# Patient Record
Sex: Male | Born: 1965 | Race: White | Hispanic: No | Marital: Married | Smoking: Never smoker
Health system: Southern US, Community
[De-identification: ages and names within clinical notes are randomized; demographics above are authoritative.]

## PROBLEM LIST (undated history)

## (undated) DIAGNOSIS — I219 Acute myocardial infarction, unspecified: Secondary | ICD-10-CM

## (undated) DIAGNOSIS — E079 Disorder of thyroid, unspecified: Secondary | ICD-10-CM

## (undated) DIAGNOSIS — I10 Essential (primary) hypertension: Secondary | ICD-10-CM

## (undated) DIAGNOSIS — E039 Hypothyroidism, unspecified: Secondary | ICD-10-CM

## (undated) DIAGNOSIS — R7401 Elevation of levels of liver transaminase levels: Secondary | ICD-10-CM

## (undated) DIAGNOSIS — R011 Cardiac murmur, unspecified: Secondary | ICD-10-CM

## (undated) DIAGNOSIS — E785 Hyperlipidemia, unspecified: Secondary | ICD-10-CM

## (undated) DIAGNOSIS — N189 Chronic kidney disease, unspecified: Secondary | ICD-10-CM

## (undated) DIAGNOSIS — F419 Anxiety disorder, unspecified: Secondary | ICD-10-CM

## (undated) DIAGNOSIS — R740 Nonspecific elevation of levels of transaminase and lactic acid dehydrogenase [LDH]: Secondary | ICD-10-CM

## (undated) DIAGNOSIS — I2510 Atherosclerotic heart disease of native coronary artery without angina pectoris: Secondary | ICD-10-CM

## (undated) DIAGNOSIS — E230 Hypopituitarism: Secondary | ICD-10-CM

## (undated) DIAGNOSIS — G4730 Sleep apnea, unspecified: Secondary | ICD-10-CM

## (undated) DIAGNOSIS — F329 Major depressive disorder, single episode, unspecified: Secondary | ICD-10-CM

## (undated) DIAGNOSIS — F32A Depression, unspecified: Secondary | ICD-10-CM

## (undated) HISTORY — PX: OTHER SURGICAL HISTORY: SHX169

## (undated) HISTORY — DX: Nonspecific elevation of levels of transaminase and lactic acid dehydrogenase (ldh): R74.0

## (undated) HISTORY — DX: Major depressive disorder, single episode, unspecified: F32.9

## (undated) HISTORY — PX: KNEE ARTHROSCOPY: SUR90

## (undated) HISTORY — DX: Sleep apnea, unspecified: G47.30

## (undated) HISTORY — PX: SHOULDER SURGERY: SHX246

## (undated) HISTORY — DX: Essential (primary) hypertension: I10

## (undated) HISTORY — DX: Elevation of levels of liver transaminase levels: R74.01

## (undated) HISTORY — DX: Hypopituitarism: E23.0

## (undated) HISTORY — DX: Atherosclerotic heart disease of native coronary artery without angina pectoris: I25.10

## (undated) HISTORY — DX: Hyperlipidemia, unspecified: E78.5

## (undated) HISTORY — PX: LASER ABLATION: SHX1947

## (undated) HISTORY — DX: Disorder of thyroid, unspecified: E07.9

## (undated) HISTORY — DX: Depression, unspecified: F32.A

## (undated) HISTORY — DX: Cardiac murmur, unspecified: R01.1

## (undated) HISTORY — DX: Acute myocardial infarction, unspecified: I21.9

---

## 1999-10-06 ENCOUNTER — Ambulatory Visit (HOSPITAL_COMMUNITY): Admission: RE | Admit: 1999-10-06 | Discharge: 1999-10-06 | Payer: Self-pay | Admitting: Vascular Surgery

## 1999-10-06 ENCOUNTER — Encounter (INDEPENDENT_AMBULATORY_CARE_PROVIDER_SITE_OTHER): Payer: Self-pay

## 1999-10-06 ENCOUNTER — Encounter: Payer: Self-pay | Admitting: Vascular Surgery

## 2009-03-24 ENCOUNTER — Encounter: Admission: RE | Admit: 2009-03-24 | Discharge: 2009-03-24 | Payer: Self-pay | Admitting: Orthopedic Surgery

## 2015-04-27 ENCOUNTER — Other Ambulatory Visit: Payer: Self-pay | Admitting: Family Medicine

## 2015-06-02 ENCOUNTER — Other Ambulatory Visit: Payer: Self-pay | Admitting: Family Medicine

## 2015-07-08 ENCOUNTER — Telehealth: Payer: Self-pay | Admitting: Family Medicine

## 2015-07-08 MED ORDER — ALPRAZOLAM 1 MG PO TABS: 1.0000 mg | ORAL_TABLET | Freq: Two times a day (BID) | ORAL | Status: DC

## 2015-07-08 MED ORDER — LIOTHYRONINE SODIUM 25 MCG PO TABS: 75.0000 ug | ORAL_TABLET | Freq: Every day | ORAL | Status: DC

## 2015-07-08 NOTE — Telephone Encounter (Signed)
Pt called stated he needs a refill on Alprazolam, he has enough to last until lunch, wants to know if 15 tabs, enough to last until his appt Monday can be called in to the pharmacy. [facility] [place]. 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 Needs ASAP. Pt also needs refill on Cytomel.

## 2015-07-08 NOTE — Telephone Encounter (Signed)
Ro from [facility] called has questions about pt's Zanax RX. Please call Ro from [facility] ASAP. Thanks.

## 2015-07-09 NOTE — Telephone Encounter (Signed)
Cancelled Rx, patient overtaking, MAC discussed with patient

## 2015-07-10 DIAGNOSIS — I10 Essential (primary) hypertension: Secondary | ICD-10-CM | POA: Insufficient documentation

## 2015-07-10 DIAGNOSIS — E079 Disorder of thyroid, unspecified: Secondary | ICD-10-CM | POA: Insufficient documentation

## 2015-07-10 DIAGNOSIS — F339 Major depressive disorder, recurrent, unspecified: Secondary | ICD-10-CM | POA: Insufficient documentation

## 2015-07-10 DIAGNOSIS — E785 Hyperlipidemia, unspecified: Secondary | ICD-10-CM | POA: Insufficient documentation

## 2015-07-10 DIAGNOSIS — F329 Major depressive disorder, single episode, unspecified: Secondary | ICD-10-CM

## 2015-07-13 ENCOUNTER — Ambulatory Visit (INDEPENDENT_AMBULATORY_CARE_PROVIDER_SITE_OTHER): Payer: BLUE CROSS/BLUE SHIELD | Admitting: Family Medicine

## 2015-07-13 ENCOUNTER — Encounter: Payer: Self-pay | Admitting: Family Medicine

## 2015-07-13 ENCOUNTER — Other Ambulatory Visit: Payer: Self-pay | Admitting: Family Medicine

## 2015-07-13 VITALS — BP 151/87 | HR 78 | Temp 98.0°F | Ht 69.0 in | Wt 244.0 lb

## 2015-07-13 DIAGNOSIS — I129 Hypertensive chronic kidney disease with stage 1 through stage 4 chronic kidney disease, or unspecified chronic kidney disease: Secondary | ICD-10-CM

## 2015-07-13 DIAGNOSIS — N181 Chronic kidney disease, stage 1: Secondary | ICD-10-CM

## 2015-07-13 DIAGNOSIS — F32A Depression, unspecified: Secondary | ICD-10-CM

## 2015-07-13 DIAGNOSIS — N182 Chronic kidney disease, stage 2 (mild): Secondary | ICD-10-CM

## 2015-07-13 DIAGNOSIS — N184 Chronic kidney disease, stage 4 (severe): Secondary | ICD-10-CM | POA: Diagnosis not present

## 2015-07-13 DIAGNOSIS — I150 Renovascular hypertension: Secondary | ICD-10-CM | POA: Diagnosis not present

## 2015-07-13 DIAGNOSIS — E790 Hyperuricemia without signs of inflammatory arthritis and tophaceous disease: Secondary | ICD-10-CM | POA: Insufficient documentation

## 2015-07-13 DIAGNOSIS — N183 Chronic kidney disease, stage 3 (moderate): Secondary | ICD-10-CM

## 2015-07-13 DIAGNOSIS — R7989 Other specified abnormal findings of blood chemistry: Secondary | ICD-10-CM

## 2015-07-13 DIAGNOSIS — N189 Chronic kidney disease, unspecified: Secondary | ICD-10-CM | POA: Diagnosis not present

## 2015-07-13 DIAGNOSIS — E230 Hypopituitarism: Secondary | ICD-10-CM | POA: Diagnosis not present

## 2015-07-13 DIAGNOSIS — F329 Major depressive disorder, single episode, unspecified: Secondary | ICD-10-CM

## 2015-07-13 DIAGNOSIS — N185 Chronic kidney disease, stage 5: Secondary | ICD-10-CM

## 2015-07-13 DIAGNOSIS — I120 Hypertensive chronic kidney disease with stage 5 chronic kidney disease or end stage renal disease: Secondary | ICD-10-CM | POA: Insufficient documentation

## 2015-07-13 DIAGNOSIS — E079 Disorder of thyroid, unspecified: Secondary | ICD-10-CM | POA: Diagnosis not present

## 2015-07-13 DIAGNOSIS — G4730 Sleep apnea, unspecified: Secondary | ICD-10-CM | POA: Diagnosis not present

## 2015-07-13 MED ORDER — RAMIPRIL 10 MG PO CAPS: ORAL_CAPSULE | ORAL | Status: DC

## 2015-07-13 MED ORDER — ALPRAZOLAM 1 MG PO TABS: 1.0000 mg | ORAL_TABLET | Freq: Two times a day (BID) | ORAL | Status: DC | PRN

## 2015-07-13 MED ORDER — LIOTHYRONINE SODIUM 25 MCG PO TABS: 75.0000 ug | ORAL_TABLET | Freq: Every day | ORAL | Status: DC

## 2015-07-13 MED ORDER — ALPRAZOLAM 1 MG PO TABS: 1.0000 mg | ORAL_TABLET | Freq: Two times a day (BID) | ORAL | Status: DC

## 2015-07-13 MED ORDER — SERTRALINE HCL 100 MG PO TABS: ORAL_TABLET | ORAL | Status: DC

## 2015-07-13 MED ORDER — NEFAZODONE HCL 150 MG PO TABS: ORAL_TABLET | ORAL | Status: DC

## 2015-07-13 NOTE — Assessment & Plan Note (Signed)
Uses CPAP some of the time has some trouble with fitting and mask comfort

## 2015-07-13 NOTE — Assessment & Plan Note (Signed)
Patient doing stable with testosterone replacement 100 mg IM every 7 days

## 2015-07-13 NOTE — Progress Notes (Signed)
BP 151/87 mmHg  Pulse 78  Temp(Src) 98 F (36.7 C)  Ht 5\' 9"  (1.753 m)  Wt 244 lb (110.678 kg)  BMI 36.02 kg/m2  SpO2 96%   Subjective:    Patient ID: Dylan Chandler, male    DOB: Feb 25, 1966, 49 y.o.   MRN: [id number]  HPI: Dylan Chandler is a 49 y.o. male  Chief Complaint  Patient presents with  . 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 Concerned about overuse of's Xanax patient had prescription for Xanax 195 tablets given in given on June 13 With directions to take 2 tablets a day with an extra half on a when necessary basis and to limit use. With patient's stress took 3 tablets more than he should There was a refill marked but [facility] and bottle was dispensed with refills require authorization Patient's previous Xanax prescription was used appropriately. Agents depression uses high-dose sertraline and Serzone has used these long-term without problems has been well controlled.  Relevant past medical, surgical, family and social history reviewed and updated as indicated. Interim medical history since our last visit reviewed. Allergies and medications reviewed and updated.  Review of Systems  Constitutional: Negative.   Respiratory: Negative.   Cardiovascular: Negative.     Per HPI unless specifically indicated above     Objective:    BP 151/87 mmHg  Pulse 78  Temp(Src) 98 F (36.7 C)  Ht 5\' 9"  (1.753 m)  Wt 244 lb (110.678 kg)  BMI 36.02 kg/m2  SpO2 96%  Wt Readings from Last 3 Encounters:  07/13/15 244 lb (110.678 kg)  02/11/15  232 lb (105.235 kg)    Physical Exam  Constitutional: He is oriented to person, place, and time. He appears well-developed and well-nourished. No distress.  HENT:  Head: Normocephalic and atraumatic.  Right Ear: Hearing normal.  Left Ear: Hearing normal.  Nose: Nose normal.  Eyes: Conjunctivae and lids are normal. Right eye exhibits no discharge. Left eye exhibits no discharge. No scleral icterus.  Cardiovascular: Normal rate, regular rhythm and normal heart sounds.   Pulmonary/Chest: Effort normal and breath sounds normal. No respiratory distress.  Musculoskeletal: Normal range of motion.  Neurological: He is alert and oriented to person, place, and time.  Skin: Skin is intact. No rash noted.  Psychiatric: He has a normal mood and affect. His speech is normal and behavior is normal. Judgment and thought content normal. Cognition and memory are normal.    No results found for this or any previous visit.    Assessment & Plan:   Problem List Items Addressed This Visit      Cardiovascular and Mediastinum   Hypertension    Discuss elevated blood pressure here in the office with patient on home and work checks blood pressure consistently good Patient taken Ramipril 10 mg Discussed need to take 20 mg for renal protection      Relevant Orders   Comprehensive metabolic panel     Endocrine   Thyroid disease    Discuss last TSH was suppressed we will recheck TSH today and consider adjusting medications pending outcome But patient with pituitary  disease and suppressed pituitary gland will check T4 and T3 also      Relevant Orders   TSH   T3 Uptake   T4, free   Hypogonadotropic hypogonadism 1    Patient doing stable with testosterone replacement 100 mg IM every 7 days        Genitourinary   Hypertensive chronic kidney disease - Primary    Patient has not been back to nephrology at [facility]. He will call and get back in for follow-up.      Relevant Orders   Comprehensive  metabolic panel     Other   Depression    Discussed depression care and treatment with anxiety patient is been stable on current dose of medications for years has just increased use of Xanax this last 2 months Discussed dangers risk and current political environment on using high-dose medications. Will refer to psychiatry to further evaluate and adjust and write his depression and anxiety medications. Patient has approached Dr. Bridgett Larsson and left a message about getting an appointment.      Relevant Medications   ALPRAZolam (XANAX) 1 MG tablet   ALPRAZolam (XANAX) 1 MG tablet   Sleep apnea    Uses CPAP some of the time has some trouble with fitting and mask comfort      Elevated uric acid in blood    No gout symptoms      Relevant Orders   Uric acid       Follow up plan: Return in about 3 months (around 10/13/2015), or if symptoms worsen or fail to improve, for Follow-up medication.

## 2015-07-13 NOTE — Assessment & Plan Note (Addendum)
Discuss last TSH was suppressed we will recheck TSH today and consider adjusting medications pending outcome But patient with pituitary disease and suppressed pituitary gland will check T4 and T3 also

## 2015-07-13 NOTE — Assessment & Plan Note (Signed)
Discuss elevated blood pressure here in the office with patient on home and work checks blood pressure consistently good Patient taken Ramipril 10 mg Discussed need to take 20 mg for renal protection

## 2015-07-13 NOTE — Assessment & Plan Note (Signed)
Discussed depression care and treatment with anxiety patient is been stable on current dose of medications for years has just increased use of Xanax this last 2 months Discussed dangers risk and current political environment on using high-dose medications. Will refer to psychiatry to further evaluate and adjust and write his depression and anxiety medications. Patient has approached Dr. Bridgett Larsson and left a message about getting an appointment.

## 2015-07-13 NOTE — Assessment & Plan Note (Signed)
Patient has not been back to nephrology at [facility]. He will call and get back in for follow-up.

## 2015-07-13 NOTE — Assessment & Plan Note (Signed)
No gout symptoms

## 2015-07-15 ENCOUNTER — Other Ambulatory Visit: Payer: Self-pay | Admitting: Family Medicine

## 2015-07-15 ENCOUNTER — Other Ambulatory Visit: Payer: BLUE CROSS/BLUE SHIELD

## 2015-07-15 DIAGNOSIS — E039 Hypothyroidism, unspecified: Secondary | ICD-10-CM

## 2015-07-16 ENCOUNTER — Encounter: Payer: Self-pay | Admitting: Family Medicine

## 2015-07-16 LAB — T3 UPTAKE
FREE THYROXINE INDEX: 0.3 — AB (ref 1.2–4.9)
T3 Uptake Ratio: 35 % (ref 24–39)

## 2015-07-16 LAB — TSH: TSH: 0.537 u[IU]/mL (ref 0.450–4.500)

## 2015-07-16 LAB — T4: T4, Total: 0.9 ug/dL — CL (ref 4.5–12.0)

## 2015-07-16 LAB — T4, FREE: FREE T4: 0.24 ng/dL — AB (ref 0.82–1.77)

## 2015-07-17 ENCOUNTER — Other Ambulatory Visit: Payer: Self-pay | Admitting: Family Medicine

## 2015-07-17 NOTE — Telephone Encounter (Signed)
Routing to provider  

## 2015-07-27 ENCOUNTER — Other Ambulatory Visit: Payer: Self-pay | Admitting: Family Medicine

## 2015-07-27 NOTE — Telephone Encounter (Signed)
Looks like he got 180 pills on 8/29- can you make sure he really needs this Rx.

## 2015-07-28 ENCOUNTER — Other Ambulatory Visit: Payer: Self-pay | Admitting: Family Medicine

## 2015-09-30 ENCOUNTER — Ambulatory Visit: Payer: BLUE CROSS/BLUE SHIELD | Admitting: Family Medicine

## 2015-10-07 ENCOUNTER — Ambulatory Visit: Payer: BLUE CROSS/BLUE SHIELD | Admitting: Family Medicine

## 2015-10-13 ENCOUNTER — Ambulatory Visit (INDEPENDENT_AMBULATORY_CARE_PROVIDER_SITE_OTHER): Payer: BLUE CROSS/BLUE SHIELD | Admitting: Family Medicine

## 2015-10-13 ENCOUNTER — Encounter: Payer: Self-pay | Admitting: Family Medicine

## 2015-10-13 VITALS — BP 130/80 | HR 81 | Temp 97.7°F | Ht 68.0 in | Wt 248.0 lb

## 2015-10-13 DIAGNOSIS — I10 Essential (primary) hypertension: Secondary | ICD-10-CM | POA: Diagnosis not present

## 2015-10-13 DIAGNOSIS — F329 Major depressive disorder, single episode, unspecified: Secondary | ICD-10-CM | POA: Diagnosis not present

## 2015-10-13 DIAGNOSIS — F32A Depression, unspecified: Secondary | ICD-10-CM

## 2015-10-13 MED ORDER — ALPRAZOLAM 1 MG PO TABS: 1.0000 mg | ORAL_TABLET | Freq: Two times a day (BID) | ORAL | Status: DC | PRN

## 2015-10-13 NOTE — Progress Notes (Signed)
   BP 130/80 mmHg  Pulse 81  Temp(Src) 97.7 F (36.5 C)  Ht 5\' 8"  (1.727 m)  Wt 248 lb (112.492 kg)  BMI 37.72 kg/m2  SpO2 95%   Subjective:    Patient ID: Dylan Chandler, male    DOB: Apr 21, 1966, 49 y.o.   MRN: [id number]  HPI: Dylan Chandler is a 49 y.o. male  No chief complaint on file.  patient doing well with depression medicines anxiety Taking Xanax twice a day and stable Depression stable pt under great deal of stress with continued divorce mess Blood pressure other medications doing well  Relevant past medical, surgical, family and social history reviewed and updated as indicated. Interim medical history since our last visit reviewed. Allergies and medications reviewed and updated.  Review of Systems  Constitutional: Negative.   Respiratory: Negative.   Cardiovascular: Negative.     Per HPI unless specifically indicated above     Objective:    BP 130/80 mmHg  Pulse 81  Temp(Src) 97.7 F (36.5 C)  Ht 5\' 8"  (1.727 m)  Wt 248 lb (112.492 kg)  BMI 37.72 kg/m2  SpO2 95%  Wt Readings from Last 3 Encounters:  10/13/15 248 lb (112.492 kg)  07/13/15 244 lb (110.678 kg)  02/11/15 232 lb (105.235 kg)    Physical Exam  Constitutional: He is oriented to person, place, and time. He appears well-developed and well-nourished. No distress.  HENT:  Head: Normocephalic and atraumatic.  Right Ear: Hearing normal.  Left Ear: Hearing normal.  Nose: Nose normal.  Eyes: Conjunctivae and lids are normal. Right eye exhibits no discharge. Left eye exhibits no discharge. No scleral icterus.  Cardiovascular: Normal rate, regular rhythm and normal heart sounds.   Pulmonary/Chest: Effort normal and breath sounds normal. No respiratory distress.  Musculoskeletal: Normal range of motion.  Neurological: He is alert and oriented to person, place, and time.  Skin: Skin is intact. No rash noted.  Psychiatric: He has a normal mood and affect. His speech is normal and behavior is  normal. Judgment and thought content normal. Cognition and memory are normal.    Results for orders placed or performed in visit on 07/15/15  TSH  Result Value Ref Range   TSH 0.537 0.450 - 4.500 uIU/mL  T4, free  Result Value Ref Range   Free T4 0.24 (L) 0.82 - 1.77 ng/dL  T4  Result Value Ref Range   T4, Total 0.9 (LL) 4.5 - 12.0 ug/dL  T3 Uptake  Result Value Ref Range   T3 Uptake Ratio 35 24 - 39 %   Free Thyroxine Index 0.3 (L) 1.2 - 4.9      Assessment & Plan:   Problem List Items Addressed This Visit      Cardiovascular and Mediastinum   Hypertension - Primary    The current medical regimen is effective;  continue present plan and medications.         Other   Depression    The current medical regimen is effective;  continue present plan and medications.       Relevant Medications   ALPRAZolam (XANAX) 1 MG tablet       Follow up plan: Return in about 3 months (around 01/12/2016) for Physical Exam March.

## 2015-10-13 NOTE — Assessment & Plan Note (Signed)
The current medical regimen is effective;  continue present plan and medications.  

## 2015-10-23 ENCOUNTER — Other Ambulatory Visit: Payer: Self-pay | Admitting: Family Medicine

## 2015-11-24 ENCOUNTER — Other Ambulatory Visit: Payer: Self-pay | Admitting: Family Medicine

## 2016-01-21 ENCOUNTER — Telehealth: Payer: Self-pay | Admitting: Family Medicine

## 2016-01-21 ENCOUNTER — Other Ambulatory Visit: Payer: Self-pay | Admitting: Family Medicine

## 2016-01-21 NOTE — Telephone Encounter (Signed)
Done this morning 

## 2016-01-21 NOTE — Telephone Encounter (Signed)
Pt needs refill on [facility]razolam [facility]

## 2016-01-22 ENCOUNTER — Other Ambulatory Visit: Payer: Self-pay | Admitting: Family Medicine

## 2016-01-25 ENCOUNTER — Other Ambulatory Visit: Payer: Self-pay | Admitting: Family Medicine

## 2016-01-26 ENCOUNTER — Encounter: Payer: Self-pay | Admitting: Family Medicine

## 2016-02-04 ENCOUNTER — Other Ambulatory Visit: Payer: Self-pay | Admitting: Family Medicine

## 2016-02-04 NOTE — Telephone Encounter (Signed)
I have tried to contact patient since Feb, called at least twice and sent a letter.

## 2016-02-04 NOTE — Telephone Encounter (Signed)
Overdue for physical. Will get him enough medicine when it's booked

## 2016-02-19 ENCOUNTER — Other Ambulatory Visit: Payer: Self-pay | Admitting: Family Medicine

## 2016-02-22 NOTE — Telephone Encounter (Signed)
Your patient 

## 2016-02-22 NOTE — Telephone Encounter (Signed)
Call pt 

## 2016-02-22 NOTE — Telephone Encounter (Signed)
Pt called stated he is returning Dr. Rance Muir call. Please call pt back asap. Thanks.

## 2016-03-21 ENCOUNTER — Other Ambulatory Visit: Payer: Self-pay | Admitting: Family Medicine

## 2016-04-06 ENCOUNTER — Encounter: Payer: Self-pay | Admitting: Family Medicine

## 2016-04-06 ENCOUNTER — Ambulatory Visit (INDEPENDENT_AMBULATORY_CARE_PROVIDER_SITE_OTHER): Payer: BLUE CROSS/BLUE SHIELD | Admitting: Family Medicine

## 2016-04-06 VITALS — BP 96/72 | HR 77 | Temp 98.2°F | Ht 69.3 in | Wt 243.0 lb

## 2016-04-06 DIAGNOSIS — I10 Essential (primary) hypertension: Secondary | ICD-10-CM

## 2016-04-06 DIAGNOSIS — E785 Hyperlipidemia, unspecified: Secondary | ICD-10-CM

## 2016-04-06 DIAGNOSIS — E230 Hypopituitarism: Secondary | ICD-10-CM

## 2016-04-06 DIAGNOSIS — G4730 Sleep apnea, unspecified: Secondary | ICD-10-CM | POA: Diagnosis not present

## 2016-04-06 DIAGNOSIS — Z0000 Encounter for general adult medical examination without abnormal findings: Secondary | ICD-10-CM | POA: Diagnosis not present

## 2016-04-06 DIAGNOSIS — F329 Major depressive disorder, single episode, unspecified: Secondary | ICD-10-CM | POA: Diagnosis not present

## 2016-04-06 DIAGNOSIS — F32A Depression, unspecified: Secondary | ICD-10-CM

## 2016-04-06 LAB — MICROSCOPIC EXAMINATION
Epithelial Cells (non renal): NONE SEEN /hpf (ref 0–10)
WBC, UA: NONE SEEN /hpf (ref 0–?)

## 2016-04-06 LAB — URINALYSIS, ROUTINE W REFLEX MICROSCOPIC
Bilirubin, UA: NEGATIVE
Glucose, UA: NEGATIVE
KETONES UA: NEGATIVE
Leukocytes, UA: NEGATIVE
NITRITE UA: NEGATIVE
Specific Gravity, UA: <1.005 — ABNORMAL LOW (ref 1.005–1.030)
Urobilinogen, Ur: 0.2 mg/dL (ref 0.2–1.0)
pH, UA: 5 (ref 5.0–7.5)

## 2016-04-06 MED ORDER — TORSEMIDE 20 MG PO TABS: 20.0000 mg | ORAL_TABLET | Freq: Every day | ORAL | Status: DC

## 2016-04-06 MED ORDER — TESTOSTERONE CYPIONATE 200 MG/ML IM SOLN
100.0000 mg | INTRAMUSCULAR | Status: DC
Start: 2016-04-06 — End: 2016-10-26

## 2016-04-06 MED ORDER — ALPRAZOLAM 1 MG PO TABS: 1.0000 mg | ORAL_TABLET | Freq: Two times a day (BID) | ORAL | Status: DC | PRN

## 2016-04-06 MED ORDER — LIOTHYRONINE SODIUM 25 MCG PO TABS: 75.0000 ug | ORAL_TABLET | Freq: Every day | ORAL | Status: DC

## 2016-04-06 MED ORDER — NEFAZODONE HCL 150 MG PO TABS: 150.0000 mg | ORAL_TABLET | Freq: Two times a day (BID) | ORAL | Status: DC

## 2016-04-06 MED ORDER — SERTRALINE HCL 100 MG PO TABS: 200.0000 mg | ORAL_TABLET | Freq: Two times a day (BID) | ORAL | Status: DC

## 2016-04-06 MED ORDER — RAMIPRIL 10 MG PO CAPS: ORAL_CAPSULE | ORAL | Status: DC

## 2016-04-06 NOTE — Addendum Note (Signed)
Addended by: Rowe Clack H on: 04/06/2016 02:02 PM   Modules accepted: Miquel Dunn

## 2016-04-06 NOTE — Assessment & Plan Note (Signed)
Encouraged regular use

## 2016-04-06 NOTE — Assessment & Plan Note (Signed)
The current medical regimen is effective;  continue present plan and medications.  

## 2016-04-06 NOTE — Progress Notes (Signed)
BP 96/72 mmHg  Pulse 77  Temp(Src) 98.2 F (36.8 C)  Ht 5' 9.3" (1.76 m)  Wt 243 lb (110.224 kg)  BMI 35.58 kg/m2  SpO2 97%   Subjective:    Patient ID: Dylan Chandler, male    DOB: 02/15/1966, 50 y.o.   MRN: [id number]  HPI: Dylan Chandler is a 50 y.o. male  Chief Complaint  Patient presents with  . Annual Exam   all in all doing well no complaints from medications depression and anxiety medications doing well reviewed psychiatry notes saying continue current care as patient's been stable on this dose long-term. Blood pressure doing well Hypopituitary and hypogonadotrophic hypogonadism all stable on medications doing testosterone replacement 100 mg every week self injected Takes torsemide about every other day Stop wearing support hose discuss importance of support hose also using CPAP  Relevant past medical, surgical, family and social history reviewed and updated as indicated. Interim medical history since our last visit reviewed. Allergies and medications reviewed and updated.  Review of Systems  Constitutional: Negative.   HENT: Negative.   Eyes: Negative.   Respiratory: Negative.   Cardiovascular: Negative.   Gastrointestinal: Negative.   Endocrine: Negative.   Genitourinary: Negative.   Musculoskeletal: Negative.   Skin: Negative.   Allergic/Immunologic: Negative.   Neurological: Negative.   Hematological: Negative.   Psychiatric/Behavioral: Negative.     Per HPI unless specifically indicated above     Objective:    BP 96/72 mmHg  Pulse 77  Temp(Src) 98.2 F (36.8 C)  Ht 5' 9.3" (1.76 m)  Wt 243 lb (110.224 kg)  BMI 35.58 kg/m2  SpO2 97%  Wt Readings from Last 3 Encounters:  04/06/16 243 lb (110.224 kg)  10/13/15 248 lb (112.492 kg)  07/13/15 244 lb (110.678 kg)    Physical Exam  Constitutional: He is oriented to person, place, and time. He appears well-developed and well-nourished.  HENT:  Head: Normocephalic and atraumatic.  Right  Ear: External ear normal.  Left Ear: External ear normal.  Eyes: Conjunctivae and EOM are normal. Pupils are equal, round, and reactive to light.  Neck: Normal range of motion. Neck supple.  Cardiovascular: Normal rate, regular rhythm, normal heart sounds and intact distal pulses.   Pulmonary/Chest: Effort normal and breath sounds normal.  Abdominal: Soft. Bowel sounds are normal. There is no splenomegaly or hepatomegaly.  Genitourinary: Rectum normal, prostate normal and penis normal.  Atrophic testes  Musculoskeletal: Normal range of motion.  Neurological: He is alert and oriented to person, place, and time. He has normal reflexes.  Skin: No rash noted. No erythema.  Psychiatric: He has a normal mood and affect. His behavior is normal. Judgment and thought content normal.    Results for orders placed or performed in visit on 07/15/15  TSH  Result Value Ref Range   TSH 0.537 0.450 - 4.500 uIU/mL  T4, free  Result Value Ref Range   Free T4 0.24 (L) 0.82 - 1.77 ng/dL  T4  Result Value Ref Range   T4, Total 0.9 (LL) 4.5 - 12.0 ug/dL  T3 Uptake  Result Value Ref Range   T3 Uptake Ratio 35 24 - 39 %   Free Thyroxine Index 0.3 (L) 1.2 - 4.9      Assessment & Plan:   Problem List Items Addressed This Visit      Cardiovascular and Mediastinum   Hypertension    The current medical regimen is effective;  continue present plan and medications.  Relevant Medications   ramipril (ALTACE) 10 MG capsule   torsemide (DEMADEX) 20 MG tablet     Endocrine   Hypogonadotropic hypogonadism 1 (Alice)    The current medical regimen is effective;  continue present plan and medications.       Relevant Medications   liothyronine (CYTOMEL) 25 MCG tablet   testosterone cypionate (DEPOTESTOSTERONE CYPIONATE) 200 MG/ML injection   Other Relevant Orders   T3, free     Other   Hyperlipidemia    The current medical regimen is effective;  continue present plan and medications.        Relevant Medications   ramipril (ALTACE) 10 MG capsule   torsemide (DEMADEX) 20 MG tablet   Depression    The current medical regimen is effective;  continue present plan and medications.       Relevant Medications   ALPRAZolam (XANAX) 1 MG tablet   nefazodone (SERZONE) 150 MG tablet   sertraline (ZOLOFT) 100 MG tablet   Sleep apnea    Encouraged regular use       Other Visit Diagnoses    Routine general medical examination at a health care facility    -  Primary    Relevant Orders    CBC with Differential/Platelet    Comprehensive metabolic panel    Lipid Panel w/o Chol/HDL Ratio    PSA    Urinalysis, Routine w reflex microscopic (not at [facility])         Follow up plan: Return in about 6 months (around 10/07/2016), or if symptoms worsen or fail to improve, for med check testosterone, PSA, CBC, BMP.

## 2016-04-07 LAB — COMPREHENSIVE METABOLIC PANEL
ALK PHOS: 71 IU/L (ref 39–117)
ALT: 62 IU/L — ABNORMAL HIGH (ref 0–44)
AST: 67 IU/L — ABNORMAL HIGH (ref 0–40)
Albumin/Globulin Ratio: 1.8 (ref 1.2–2.2)
Albumin: 4.4 g/dL (ref 3.5–5.5)
BUN / CREAT RATIO: 23 — AB (ref 9–20)
BUN: 82 mg/dL (ref 6–24)
Bilirubin Total: 0.3 mg/dL (ref 0.0–1.2)
CALCIUM: 9.3 mg/dL (ref 8.7–10.2)
CO2: 20 mmol/L (ref 18–29)
CREATININE: 3.61 mg/dL — AB (ref 0.76–1.27)
Chloride: 101 mmol/L (ref 96–106)
GFR calc non Af Amer: 19 mL/min/{1.73_m2} — ABNORMAL LOW (ref >59)
GFR, EST AFRICAN AMERICAN: 22 mL/min/{1.73_m2} — AB (ref >59)
GLOBULIN, TOTAL: 2.5 g/dL (ref 1.5–4.5)
Glucose: 67 mg/dL (ref 65–99)
Potassium: 5.3 mmol/L — ABNORMAL HIGH (ref 3.5–5.2)
SODIUM: 141 mmol/L (ref 134–144)
Total Protein: 6.9 g/dL (ref 6.0–8.5)

## 2016-04-07 LAB — CBC WITH DIFFERENTIAL/PLATELET
Basophils Absolute: 0 10*3/uL (ref 0.0–0.2)
Basos: 0 %
EOS (ABSOLUTE): 0.2 10*3/uL (ref 0.0–0.4)
EOS: 2 %
HEMATOCRIT: 52.2 % — AB (ref 37.5–51.0)
Hemoglobin: 17.7 g/dL (ref 12.6–17.7)
IMMATURE GRANS (ABS): 0 10*3/uL (ref 0.0–0.1)
IMMATURE GRANULOCYTES: 0 %
LYMPHS: 27 %
Lymphocytes Absolute: 2.6 10*3/uL (ref 0.7–3.1)
MCH: 29.9 pg (ref 26.6–33.0)
MCHC: 33.9 g/dL (ref 31.5–35.7)
MCV: 88 fL (ref 79–97)
MONOCYTES: 9 %
MONOS ABS: 0.8 10*3/uL (ref 0.1–0.9)
NEUTROS PCT: 62 %
Neutrophils Absolute: 5.9 10*3/uL (ref 1.4–7.0)
PLATELETS: 190 10*3/uL (ref 150–379)
RBC: 5.91 x10E6/uL — AB (ref 4.14–5.80)
RDW: 14.1 % (ref 12.3–15.4)
WBC: 9.5 10*3/uL (ref 3.4–10.8)

## 2016-04-07 LAB — LIPID PANEL W/O CHOL/HDL RATIO
CHOLESTEROL TOTAL: 216 mg/dL — AB (ref 100–199)
HDL: 25 mg/dL — ABNORMAL LOW (ref >39)
LDL CALC: 134 mg/dL — AB (ref 0–99)
Triglycerides: 285 mg/dL — ABNORMAL HIGH (ref 0–149)
VLDL Cholesterol Cal: 57 mg/dL — ABNORMAL HIGH (ref 5–40)

## 2016-04-07 LAB — T3, FREE: T3, Free: 4.5 pg/mL — ABNORMAL HIGH (ref 2.0–4.4)

## 2016-04-07 LAB — PSA: PROSTATE SPECIFIC AG, SERUM: 0.7 ng/mL (ref 0.0–4.0)

## 2016-04-11 ENCOUNTER — Other Ambulatory Visit: Payer: Self-pay | Admitting: Family Medicine

## 2016-04-13 ENCOUNTER — Telehealth: Payer: Self-pay | Admitting: Family Medicine

## 2016-04-13 NOTE — Telephone Encounter (Signed)
Phone call Discussed with patient renal failure and importance for getting in with nephrology very soon. Patient very close to dialysis.  Patient does not want to go to local nephrology because they talked about doing a renal biopsy. Patient will get a second opinion at [facility]. He will make his own appointment and notify me when this is done. Discuss hemoglobin elevated

## 2016-06-09 ENCOUNTER — Encounter (HOSPITAL_COMMUNITY)
Admission: EM | Disposition: A | Discharge status: Home / Self Care | Payer: Self-pay | Source: Home / Self Care | Attending: Cardiovascular Disease

## 2016-06-09 ENCOUNTER — Inpatient Hospital Stay (HOSPITAL_COMMUNITY)
Admission: EM | Admit: 2016-06-09 | Discharge: 2016-06-11 | DRG: 246 | Disposition: A | Discharge status: Home / Self Care | Payer: BLUE CROSS/BLUE SHIELD | Attending: Cardiovascular Disease | Admitting: Cardiovascular Disease

## 2016-06-09 ENCOUNTER — Encounter (HOSPITAL_COMMUNITY): Payer: Self-pay | Admitting: *Deleted

## 2016-06-09 DIAGNOSIS — I130 Hypertensive heart and chronic kidney disease with heart failure and stage 1 through stage 4 chronic kidney disease, or unspecified chronic kidney disease: Secondary | ICD-10-CM | POA: Diagnosis present

## 2016-06-09 DIAGNOSIS — M25519 Pain in unspecified shoulder: Secondary | ICD-10-CM | POA: Diagnosis present

## 2016-06-09 DIAGNOSIS — F329 Major depressive disorder, single episode, unspecified: Secondary | ICD-10-CM | POA: Diagnosis present

## 2016-06-09 DIAGNOSIS — R809 Proteinuria, unspecified: Secondary | ICD-10-CM | POA: Diagnosis present

## 2016-06-09 DIAGNOSIS — N179 Acute kidney failure, unspecified: Secondary | ICD-10-CM | POA: Diagnosis present

## 2016-06-09 DIAGNOSIS — Z7952 Long term (current) use of systemic steroids: Secondary | ICD-10-CM | POA: Diagnosis not present

## 2016-06-09 DIAGNOSIS — Z7982 Long term (current) use of aspirin: Secondary | ICD-10-CM | POA: Diagnosis not present

## 2016-06-09 DIAGNOSIS — I2109 ST elevation (STEMI) myocardial infarction involving other coronary artery of anterior wall: Secondary | ICD-10-CM

## 2016-06-09 DIAGNOSIS — E039 Hypothyroidism, unspecified: Secondary | ICD-10-CM | POA: Diagnosis present

## 2016-06-09 DIAGNOSIS — E875 Hyperkalemia: Secondary | ICD-10-CM | POA: Diagnosis present

## 2016-06-09 DIAGNOSIS — N185 Chronic kidney disease, stage 5: Secondary | ICD-10-CM

## 2016-06-09 DIAGNOSIS — I129 Hypertensive chronic kidney disease with stage 1 through stage 4 chronic kidney disease, or unspecified chronic kidney disease: Secondary | ICD-10-CM | POA: Diagnosis not present

## 2016-06-09 DIAGNOSIS — E872 Acidosis: Secondary | ICD-10-CM | POA: Diagnosis present

## 2016-06-09 DIAGNOSIS — I459 Conduction disorder, unspecified: Secondary | ICD-10-CM | POA: Diagnosis present

## 2016-06-09 DIAGNOSIS — Z79899 Other long term (current) drug therapy: Secondary | ICD-10-CM

## 2016-06-09 DIAGNOSIS — I5041 Acute combined systolic (congestive) and diastolic (congestive) heart failure: Secondary | ICD-10-CM | POA: Diagnosis present

## 2016-06-09 DIAGNOSIS — E785 Hyperlipidemia, unspecified: Secondary | ICD-10-CM | POA: Diagnosis present

## 2016-06-09 DIAGNOSIS — Z955 Presence of coronary angioplasty implant and graft: Secondary | ICD-10-CM

## 2016-06-09 DIAGNOSIS — D751 Secondary polycythemia: Secondary | ICD-10-CM | POA: Diagnosis present

## 2016-06-09 DIAGNOSIS — Z888 Allergy status to other drugs, medicaments and biological substances status: Secondary | ICD-10-CM | POA: Diagnosis not present

## 2016-06-09 DIAGNOSIS — I340 Nonrheumatic mitral (valve) insufficiency: Secondary | ICD-10-CM | POA: Diagnosis present

## 2016-06-09 DIAGNOSIS — E230 Hypopituitarism: Secondary | ICD-10-CM | POA: Diagnosis present

## 2016-06-09 DIAGNOSIS — N189 Chronic kidney disease, unspecified: Secondary | ICD-10-CM

## 2016-06-09 DIAGNOSIS — I2510 Atherosclerotic heart disease of native coronary artery without angina pectoris: Secondary | ICD-10-CM | POA: Diagnosis present

## 2016-06-09 DIAGNOSIS — I120 Hypertensive chronic kidney disease with stage 5 chronic kidney disease or end stage renal disease: Secondary | ICD-10-CM | POA: Diagnosis present

## 2016-06-09 DIAGNOSIS — G8929 Other chronic pain: Secondary | ICD-10-CM | POA: Diagnosis present

## 2016-06-09 DIAGNOSIS — I249 Acute ischemic heart disease, unspecified: Secondary | ICD-10-CM

## 2016-06-09 DIAGNOSIS — I2102 ST elevation (STEMI) myocardial infarction involving left anterior descending coronary artery: Principal | ICD-10-CM | POA: Diagnosis present

## 2016-06-09 DIAGNOSIS — I213 ST elevation (STEMI) myocardial infarction of unspecified site: Secondary | ICD-10-CM | POA: Diagnosis not present

## 2016-06-09 DIAGNOSIS — N184 Chronic kidney disease, stage 4 (severe): Secondary | ICD-10-CM | POA: Diagnosis present

## 2016-06-09 DIAGNOSIS — I10 Essential (primary) hypertension: Secondary | ICD-10-CM | POA: Diagnosis present

## 2016-06-09 HISTORY — PX: CARDIAC CATHETERIZATION: SHX172

## 2016-06-09 LAB — I-STAT CHEM 8, ED
BUN: 64 mg/dL — AB (ref 6–20)
CREATININE: 3.9 mg/dL — AB (ref 0.61–1.24)
Calcium, Ion: 1.11 mmol/L — ABNORMAL LOW (ref 1.13–1.30)
Chloride: 106 mmol/L (ref 101–111)
Glucose, Bld: 121 mg/dL — ABNORMAL HIGH (ref 65–99)
HEMATOCRIT: 52 % (ref 39.0–52.0)
Hemoglobin: 17.7 g/dL — ABNORMAL HIGH (ref 13.0–17.0)
Potassium: 4.4 mmol/L (ref 3.5–5.1)
Sodium: 141 mmol/L (ref 135–145)
TCO2: 22 mmol/L (ref 0–100)

## 2016-06-09 LAB — LIPID PANEL
CHOL/HDL RATIO: 10.4 ratio
CHOLESTEROL: 198 mg/dL (ref 0–200)
HDL: 19 mg/dL — AB (ref >40)
LDL Cholesterol: 153 mg/dL — ABNORMAL HIGH (ref 0–99)
Triglycerides: 130 mg/dL (ref <150)
VLDL: 26 mg/dL (ref 0–40)

## 2016-06-09 LAB — DIFFERENTIAL
BASOS ABS: 0 10*3/uL (ref 0.0–0.1)
BASOS PCT: 0 %
EOS ABS: 0.1 10*3/uL (ref 0.0–0.7)
Eosinophils Relative: 1 %
Lymphocytes Relative: 15 %
Lymphs Abs: 1.3 10*3/uL (ref 0.7–4.0)
Monocytes Absolute: 0.4 10*3/uL (ref 0.1–1.0)
Monocytes Relative: 5 %
NEUTROS PCT: 79 %
Neutro Abs: 6.8 10*3/uL (ref 1.7–7.7)

## 2016-06-09 LAB — POCT ACTIVATED CLOTTING TIME: Activated Clotting Time: 538 seconds

## 2016-06-09 LAB — COMPREHENSIVE METABOLIC PANEL
ALT: 33 U/L (ref 17–63)
AST: 48 U/L — ABNORMAL HIGH (ref 15–41)
Albumin: 3.6 g/dL (ref 3.5–5.0)
Alkaline Phosphatase: 54 U/L (ref 38–126)
Anion gap: 8 (ref 5–15)
BUN: 64 mg/dL — ABNORMAL HIGH (ref 6–20)
CHLORIDE: 107 mmol/L (ref 101–111)
CO2: 21 mmol/L — ABNORMAL LOW (ref 22–32)
CREATININE: 3.83 mg/dL — AB (ref 0.61–1.24)
Calcium: 8.5 mg/dL — ABNORMAL LOW (ref 8.9–10.3)
GFR, EST AFRICAN AMERICAN: 20 mL/min — AB (ref >60)
GFR, EST NON AFRICAN AMERICAN: 17 mL/min — AB (ref >60)
Glucose, Bld: 118 mg/dL — ABNORMAL HIGH (ref 65–99)
POTASSIUM: 5.2 mmol/L — AB (ref 3.5–5.1)
Sodium: 136 mmol/L (ref 135–145)
Total Bilirubin: 0.7 mg/dL (ref 0.3–1.2)
Total Protein: 6.3 g/dL — ABNORMAL LOW (ref 6.5–8.1)

## 2016-06-09 LAB — TROPONIN I
TROPONIN I: 1.4 ng/mL — AB (ref <0.03)
Troponin I: 1.51 ng/mL (ref <0.03)
Troponin I: 62.69 ng/mL (ref <0.03)
Troponin I: 63.61 ng/mL (ref <0.03)

## 2016-06-09 LAB — CBC
HCT: 52.5 % — ABNORMAL HIGH (ref 39.0–52.0)
HCT: 50.1 % (ref 39.0–52.0)
HEMOGLOBIN: 17.3 g/dL — AB (ref 13.0–17.0)
HEMOGLOBIN: 17 g/dL (ref 13.0–17.0)
MCH: 30.1 pg (ref 26.0–34.0)
MCH: 30.5 pg (ref 26.0–34.0)
MCHC: 33 g/dL (ref 30.0–36.0)
MCHC: 33.9 g/dL (ref 30.0–36.0)
MCV: 91.3 fL (ref 78.0–100.0)
MCV: 89.8 fL (ref 78.0–100.0)
Platelets: 156 10*3/uL (ref 150–400)
Platelets: 148 10*3/uL — ABNORMAL LOW (ref 150–400)
RBC: 5.75 MIL/uL (ref 4.22–5.81)
RBC: 5.58 MIL/uL (ref 4.22–5.81)
RDW: 13 % (ref 11.5–15.5)
RDW: 13 % (ref 11.5–15.5)
WBC: 7.8 10*3/uL (ref 4.0–10.5)
WBC: 8.6 10*3/uL (ref 4.0–10.5)

## 2016-06-09 LAB — I-STAT TROPONIN, ED: TROPONIN I, POC: 0.03 ng/mL (ref 0.00–0.08)

## 2016-06-09 LAB — PROTIME-INR
INR: 2.19
Prothrombin Time: 24.7 seconds — ABNORMAL HIGH (ref 11.4–15.2)

## 2016-06-09 LAB — BASIC METABOLIC PANEL
ANION GAP: 10 (ref 5–15)
BUN: 66 mg/dL — ABNORMAL HIGH (ref 6–20)
CALCIUM: 8.5 mg/dL — AB (ref 8.9–10.3)
CO2: 20 mmol/L — ABNORMAL LOW (ref 22–32)
Chloride: 106 mmol/L (ref 101–111)
Creatinine, Ser: 4.02 mg/dL — ABNORMAL HIGH (ref 0.61–1.24)
GFR calc Af Amer: 19 mL/min — ABNORMAL LOW (ref >60)
GFR, EST NON AFRICAN AMERICAN: 16 mL/min — AB (ref >60)
GLUCOSE: 127 mg/dL — AB (ref 65–99)
Potassium: 4.4 mmol/L (ref 3.5–5.1)
SODIUM: 136 mmol/L (ref 135–145)

## 2016-06-09 LAB — APTT: APTT: 166 s — AB (ref 24–36)

## 2016-06-09 LAB — MRSA PCR SCREENING: MRSA by PCR: NEGATIVE

## 2016-06-09 SURGERY — LEFT HEART CATH AND CORONARY ANGIOGRAPHY

## 2016-06-09 MED ORDER — SERTRALINE HCL 100 MG PO TABS
200.0000 mg | ORAL_TABLET | Freq: Two times a day (BID) | ORAL | Status: DC
  Administered 2016-06-09 – 2016-06-11 (×5): 200 mg via ORAL
  Filled 2016-06-09 (×7): qty 2

## 2016-06-09 MED ORDER — FENTANYL CITRATE (PF) 100 MCG/2ML IJ SOLN
INTRAMUSCULAR | Status: DC | PRN
  Administered 2016-06-09: 50 ug via INTRAVENOUS
  Administered 2016-06-09: 25 ug via INTRAVENOUS

## 2016-06-09 MED ORDER — MIDAZOLAM HCL 2 MG/2ML IJ SOLN
INTRAMUSCULAR | Status: DC | PRN
  Administered 2016-06-09: 2 mg via INTRAVENOUS

## 2016-06-09 MED ORDER — SODIUM CHLORIDE 0.9 % IV SOLN
0.2500 mg/kg/h | INTRAVENOUS | Status: AC
  Administered 2016-06-09: 0.25 mg/kg/h via INTRAVENOUS
  Filled 2016-06-09: qty 250

## 2016-06-09 MED ORDER — METOPROLOL TARTRATE 12.5 MG HALF TABLET
25.0000 mg | ORAL_TABLET | Freq: Two times a day (BID) | ORAL | Status: DC
  Administered 2016-06-09 – 2016-06-11 (×5): 25 mg via ORAL
  Filled 2016-06-09 (×5): qty 2

## 2016-06-09 MED ORDER — RAMIPRIL 10 MG PO CAPS
20.0000 mg | ORAL_CAPSULE | Freq: Every day | ORAL | Status: DC
  Administered 2016-06-09: 20 mg via ORAL
  Filled 2016-06-09: qty 2

## 2016-06-09 MED ORDER — VERAPAMIL HCL 2.5 MG/ML IV SOLN
INTRAVENOUS | Status: AC
  Filled 2016-06-09: qty 2

## 2016-06-09 MED ORDER — SODIUM CHLORIDE 0.9% FLUSH: 3.0000 mL | INTRAVENOUS | Status: DC | PRN

## 2016-06-09 MED ORDER — TICAGRELOR 90 MG PO TABS: 90.0000 mg | ORAL_TABLET | Freq: Two times a day (BID) | ORAL | Status: DC

## 2016-06-09 MED ORDER — ASPIRIN 81 MG PO CHEW
324.0000 mg | CHEWABLE_TABLET | Freq: Once | ORAL | Status: AC
  Administered 2016-06-09: 324 mg via ORAL
  Filled 2016-06-09: qty 4

## 2016-06-09 MED ORDER — LIDOCAINE HCL (PF) 1 % IJ SOLN
INTRAMUSCULAR | Status: DC | PRN
  Administered 2016-06-09: 2 mL

## 2016-06-09 MED ORDER — HEPARIN (PORCINE) IN NACL 2-0.9 UNIT/ML-% IJ SOLN
INTRAMUSCULAR | Status: AC
  Filled 2016-06-09: qty 1000

## 2016-06-09 MED ORDER — ALPRAZOLAM 0.25 MG PO TABS
1.0000 mg | ORAL_TABLET | Freq: Two times a day (BID) | ORAL | Status: DC | PRN
  Administered 2016-06-09 – 2016-06-10 (×2): 1 mg via ORAL
  Filled 2016-06-09 (×2): qty 4

## 2016-06-09 MED ORDER — MORPHINE SULFATE (PF) 4 MG/ML IV SOLN
4.0000 mg | Freq: Once | INTRAVENOUS | Status: AC
  Administered 2016-06-09: 4 mg via INTRAVENOUS
  Filled 2016-06-09: qty 1

## 2016-06-09 MED ORDER — BIVALIRUDIN BOLUS VIA INFUSION - CUPID
INTRAVENOUS | Status: DC | PRN
  Administered 2016-06-09: 84.375 mg via INTRAVENOUS

## 2016-06-09 MED ORDER — FENTANYL CITRATE (PF) 100 MCG/2ML IJ SOLN
INTRAMUSCULAR | Status: AC
  Filled 2016-06-09: qty 2

## 2016-06-09 MED ORDER — VERAPAMIL HCL 2.5 MG/ML IV SOLN
INTRAVENOUS | Status: DC | PRN
  Administered 2016-06-09: 10 mL via INTRA_ARTERIAL

## 2016-06-09 MED ORDER — OXYCODONE-ACETAMINOPHEN 5-325 MG PO TABS: 1.0000 | ORAL_TABLET | ORAL | Status: DC | PRN

## 2016-06-09 MED ORDER — NITROGLYCERIN 1 MG/10 ML FOR IR/CATH LAB
INTRA_ARTERIAL | Status: DC | PRN
  Administered 2016-06-09: 200 ug via INTRACORONARY

## 2016-06-09 MED ORDER — MIDAZOLAM HCL 2 MG/2ML IJ SOLN
INTRAMUSCULAR | Status: AC
  Filled 2016-06-09: qty 2

## 2016-06-09 MED ORDER — IOPAMIDOL (ISOVUE-370) INJECTION 76%
INTRAVENOUS | Status: DC | PRN
  Administered 2016-06-09: 105 mL via INTRA_ARTERIAL

## 2016-06-09 MED ORDER — HEPARIN (PORCINE) IN NACL 2-0.9 UNIT/ML-% IJ SOLN
INTRAMUSCULAR | Status: DC | PRN
  Administered 2016-06-09: 1000 mL

## 2016-06-09 MED ORDER — PRASUGREL HCL 10 MG PO TABS
10.0000 mg | ORAL_TABLET | Freq: Every day | ORAL | Status: DC
  Administered 2016-06-10 – 2016-06-11 (×2): 10 mg via ORAL
  Filled 2016-06-09 (×2): qty 1

## 2016-06-09 MED ORDER — SODIUM CHLORIDE 0.9% FLUSH
3.0000 mL | Freq: Two times a day (BID) | INTRAVENOUS | Status: DC
  Administered 2016-06-09 – 2016-06-11 (×4): 3 mL via INTRAVENOUS

## 2016-06-09 MED ORDER — BIVALIRUDIN 250 MG IV SOLR
INTRAVENOUS | Status: DC | PRN
  Administered 2016-06-09: 1.75 mg/kg/h via INTRAVENOUS

## 2016-06-09 MED ORDER — ATORVASTATIN CALCIUM 80 MG PO TABS
80.0000 mg | ORAL_TABLET | Freq: Every day | ORAL | Status: DC
  Filled 2016-06-09 (×2): qty 1

## 2016-06-09 MED ORDER — BIVALIRUDIN 250 MG IV SOLR
INTRAVENOUS | Status: AC
  Filled 2016-06-09: qty 250

## 2016-06-09 MED ORDER — ACETAMINOPHEN 325 MG PO TABS: 650.0000 mg | ORAL_TABLET | ORAL | Status: DC | PRN

## 2016-06-09 MED ORDER — ONDANSETRON HCL 4 MG/2ML IJ SOLN: 4.0000 mg | Freq: Four times a day (QID) | INTRAMUSCULAR | Status: DC | PRN

## 2016-06-09 MED ORDER — ASPIRIN 325 MG PO TABS: 325.0000 mg | ORAL_TABLET | Freq: Once | ORAL | Status: DC

## 2016-06-09 MED ORDER — LIOTHYRONINE SODIUM 25 MCG PO TABS
75.0000 ug | ORAL_TABLET | Freq: Every day | ORAL | Status: DC
  Administered 2016-06-10: 50 ug via ORAL
  Filled 2016-06-09 (×2): qty 3

## 2016-06-09 MED ORDER — IOPAMIDOL (ISOVUE-370) INJECTION 76%
INTRAVENOUS | Status: AC
  Filled 2016-06-09: qty 125

## 2016-06-09 MED ORDER — TORSEMIDE 20 MG PO TABS
20.0000 mg | ORAL_TABLET | Freq: Every day | ORAL | Status: DC
  Administered 2016-06-10 – 2016-06-11 (×2): 20 mg via ORAL
  Filled 2016-06-09 (×4): qty 1

## 2016-06-09 MED ORDER — HEPARIN SODIUM (PORCINE) 1000 UNIT/ML IJ SOLN
INTRAMUSCULAR | Status: AC
  Filled 2016-06-09: qty 1

## 2016-06-09 MED ORDER — NITROGLYCERIN 1 MG/10 ML FOR IR/CATH LAB
INTRA_ARTERIAL | Status: AC
  Filled 2016-06-09: qty 10

## 2016-06-09 MED ORDER — ASPIRIN 81 MG PO CHEW
81.0000 mg | CHEWABLE_TABLET | Freq: Every day | ORAL | Status: DC
  Administered 2016-06-10 – 2016-06-11 (×2): 81 mg via ORAL
  Filled 2016-06-09 (×2): qty 1

## 2016-06-09 MED ORDER — SODIUM CHLORIDE 0.9 % IV SOLN
INTRAVENOUS | Status: AC
  Administered 2016-06-09: 11:00:00 via INTRAVENOUS

## 2016-06-09 MED ORDER — LIDOCAINE HCL (PF) 1 % IJ SOLN
INTRAMUSCULAR | Status: AC
  Filled 2016-06-09: qty 30

## 2016-06-09 MED ORDER — NEFAZODONE HCL 150 MG PO TABS
150.0000 mg | ORAL_TABLET | Freq: Two times a day (BID) | ORAL | Status: DC
  Administered 2016-06-09 – 2016-06-11 (×5): 150 mg via ORAL
  Filled 2016-06-09 (×5): qty 1

## 2016-06-09 MED ORDER — TICAGRELOR 90 MG PO TABS
ORAL_TABLET | ORAL | Status: AC
  Filled 2016-06-09: qty 1

## 2016-06-09 MED ORDER — PRASUGREL HCL 10 MG PO TABS
60.0000 mg | ORAL_TABLET | Freq: Once | ORAL | Status: AC
  Administered 2016-06-09: 60 mg via ORAL
  Filled 2016-06-09: qty 6

## 2016-06-09 MED ORDER — SODIUM CHLORIDE 0.9 % IV SOLN: 250.0000 mL | INTRAVENOUS | Status: DC | PRN

## 2016-06-09 MED ORDER — MORPHINE SULFATE (PF) 2 MG/ML IV SOLN: 2.0000 mg | INTRAVENOUS | Status: DC | PRN

## 2016-06-09 MED ORDER — HEPARIN SODIUM (PORCINE) 5000 UNIT/ML IJ SOLN
4000.0000 [IU] | Freq: Once | INTRAMUSCULAR | Status: AC
  Administered 2016-06-09: 4000 [IU] via INTRAVENOUS
  Filled 2016-06-09: qty 1

## 2016-06-09 SURGICAL SUPPLY — 17 items
BALLN EMERGE MR 2.5X15 (BALLOONS) ×2
BALLN ~~LOC~~ EMERGE MR 3.75X20 (BALLOONS) ×2
BALLOON EMERGE MR 2.5X15 (BALLOONS) IMPLANT
BALLOON ~~LOC~~ EMERGE MR 3.75X20 (BALLOONS) IMPLANT
CATH INFINITI 5FR ANG PIGTAIL (CATHETERS) ×1 IMPLANT
CATH INFINITI JR4 5F (CATHETERS) ×1 IMPLANT
CATH VISTA GUIDE 6FR XBLAD3.5 (CATHETERS) ×1 IMPLANT
DEVICE RAD COMP TR BAND LRG (VASCULAR PRODUCTS) ×1 IMPLANT
GLIDESHEATH SLEND SS 6F .021 (SHEATH) ×1 IMPLANT
KIT ENCORE 26 ADVANTAGE (KITS) ×1 IMPLANT
KIT HEART LEFT (KITS) ×2 IMPLANT
PACK CARDIAC CATHETERIZATION (CUSTOM PROCEDURE TRAY) ×2 IMPLANT
STENT PROMUS PREM MR 3.5X24 (Permanent Stent) ×1 IMPLANT
TRANSDUCER W/STOPCOCK (MISCELLANEOUS) ×2 IMPLANT
TUBING CIL FLEX 10 FLL-RA (TUBING) ×2 IMPLANT
WIRE COUGAR XT STRL 190CM (WIRE) ×1 IMPLANT
WIRE SAFE-T 1.5MM-J .035X260CM (WIRE) ×1 IMPLANT

## 2016-06-09 NOTE — Progress Notes (Signed)
I spoke to pharmacy this afternoon. There is an interaction with Nefazodone and Brilinta. I will stop Brilinta and will load with Effient 60 mg po x 1 tonight and start Effient 10 mg daily tomorrow.   Dylan Chandler 06/09/2016 5:38 PM

## 2016-06-09 NOTE — Progress Notes (Signed)
CRITICAL VALUE ALERT  Critical value received:  PTT 166  Date of notification:  06/09/16  Time of notification:  1211  Critical value read back:Yes.    Nurse who received alert:  San Jetty RN  MD notified (1st page): Dr. Angelena Form  Expected value post cardiac cath. No new orders given.

## 2016-06-09 NOTE — H&P (Signed)
Patient ID: Dylan Chandler MRN: [id number] DOB/AGE: [birth year] 50 y.o. Admit date: 06/09/2016  Primary Care Physician: Primary Cardiologist:   HPI: 50 yo male with history of CAD with prior stenting of the RCA 10 years ago at 63, CKD, HLD, HTN, OSA who presented to the ED today with c/o chest pain. EKG with IVCD and ST changes anterior leads. Ongoing chest pain. Code STEMI activated by ED staff.   Review of systems complete and found to be negative unless listed above   Past Medical History:  Diagnosis Date  . CAD (coronary artery disease)   . Cardiac murmur   . Chronic kidney disease, stage III (moderate)   . Depression   . Elevated SGOT   . Hyperlipidemia   . Hypertension   . Hypogonadotropic hypogonadism 1 (Mehama)   . Sleep apnea   . Thyroid disease     Family History  Problem Relation Age of Onset  . Cancer Mother   . Hyperlipidemia Mother   . Cancer Father   . Hyperlipidemia Father   . AAA (abdominal aortic aneurysm) Father     Social History   Social History  . Marital status: Married    Spouse name: N/A  . Number of children: N/A  . Years of education: N/A   Occupational History  . Not on file.   Social History Main Topics  . Smoking status: Never Smoker  . Smokeless tobacco: Never Used  . Alcohol use No  . Drug use: No  . Sexual activity: Not on file   Other Topics Concern  . Not on file   Social History Narrative  . No narrative on file    Past Surgical History:  Procedure Laterality Date  . LASER ABLATION Right    leg  . phlebectomy Right    leg  . SHOULDER SURGERY      Allergies  Allergen Reactions  . Amlodipine Swelling    Prior to Admission Meds:  Prior to Admission medications   Medication Sig Start Date End Date Taking? Authorizing Provider  ALPRAZolam Duanne Moron) 1 MG tablet Take 1 tablet (1 mg total) by mouth 2 (two) times daily as needed. 04/06/16   Guadalupe Maple, MD  aspirin EC 325 MG tablet Take 325 mg by mouth daily.     Historical Provider, MD  liothyronine (CYTOMEL) 25 MCG tablet Take 3 tablets (75 mcg total) by mouth daily. 04/06/16   Guadalupe Maple, MD  nefazodone (SERZONE) 150 MG tablet Take 1 tablet (150 mg total) by mouth 2 (two) times daily. 04/06/16   Guadalupe Maple, MD  ramipril (ALTACE) 10 MG capsule TAKE 2 CAPSULES BY MOUTH DAILY. 04/06/16   Guadalupe Maple, MD  sertraline (ZOLOFT) 100 MG tablet Take 2 tablets (200 mg total) by mouth 2 (two) times daily. 04/06/16   Guadalupe Maple, MD  testosterone cypionate (DEPOTESTOSTERONE CYPIONATE) 200 MG/ML injection Inject 0.5 mLs (100 mg total) into the muscle once a week. 04/06/16   Guadalupe Maple, MD  torsemide (DEMADEX) 20 MG tablet Take 1 tablet (20 mg total) by mouth daily. 04/06/16   Guadalupe Maple, MD    Physical Exam: Blood pressure (!) 152/109, pulse 89, temperature 97.6 F (36.4 C), temperature source Oral, resp. rate 19, height 5\' 9"  (1.753 m), weight 248 lb (112.5 kg), SpO2 97 %.    General: Well developed, well nourished, appears uncomfortable.  HEENT: OP clear, mucus membranes moist  SKIN: warm, dry. No rashes.  Neuro: No focal deficits  Musculoskeletal: Muscle strength 5/5 all ext  Psychiatric: Mood and affect normal  Neck: No JVD, no carotid bruits, no thyromegaly, no lymphadenopathy.  Lungs:Clear bilaterally, no wheezes, rhonci, crackles  Cardiovascular: Regular rate and rhythm. No murmurs, gallops or rubs.  Abdomen:Soft. Bowel sounds present. Non-tender.  Extremities: No lower extremity edema. Pulses are 2 + in the bilateral DP/PT.   Labs:   Lab Results  Component Value Date   WBC 7.8 06/09/2016   HGB 17.7 (H) 06/09/2016   HCT 52.0 06/09/2016   MCV 91.3 06/09/2016   PLT 156 06/09/2016    Recent Labs Lab 06/09/16 0920 06/09/16 0947  NA 136 141  K 4.4 4.4  CL 106 106  CO2 20*  --   BUN 66* 64*  CREATININE 4.02* 3.90*  CALCIUM 8.5*  --   GLUCOSE 127* 121*     EKG: Sinus, IVCD with abnormal ST changes anterior  leads.   ASSESSMENT AND PLAN:   1. Chest pain with abnormal EKG: history of CAD. Ongoing chest pain. Plan emergent cardiac cath with possible PCI. Further plans to follow after cath.   2. CAD: see above. Plan cath.   3. Chronic kidney disease, stage 3: Pt understands risk of contrast used during emergent cardiac cath could lead to worsening renal failure. Given his acute presentation, cardiac cath in indicated.   4. HTN: Follow BP post cath and adjust medications.   5. Hyperlipidemia: Will start statin post cath.   Darlina Guys, MD 06/09/2016, 9:57 AM

## 2016-06-09 NOTE — ED Notes (Signed)
Activaited Code Stemi

## 2016-06-09 NOTE — Progress Notes (Signed)
CRITICAL VALUE ALERT  Critical value received:  Troponin 62.69;  Pt post Cath and stemi, elevated troponin expected, will continue to monitor  Date of notification:  06/09/16  Time of notification:  1844  Critical value read back:Yes.    Nurse who received alert:  Dorena Cookey  MD notified (1st page):  [place]  Time of first page:  1844  MD notified (2nd page):  Time of second page:  Responding MD:  Angelena Form  Time MD responded:  [phone]

## 2016-06-09 NOTE — ED Triage Notes (Signed)
Pt reports mid chest heaviness this am after drinking caffeine. Does have cardiac hx. Pt denies sob or nausea. ekg done at triage.

## 2016-06-09 NOTE — ED Provider Notes (Signed)
[facility] DEPT Provider Note   CSN: [id number] Arrival date & time: 06/09/16  [id number]  First Provider Contact:  First MD Initiated Contact with Patient 06/09/16 [phone]        History   Chief Complaint Chief Complaint  Patient presents with  . Chest Pain    HPI Simpson Baumert is a 50 y.o. male.  HPI  Patient presents with approximately one hour of severe substernal pressure with radiation towards his back.  He states that it seems to be coming in waves but is increasing and worsening and reminds him of his MI he had 9 years ago.  At that time he reports that he had acute clot in his right coronary artery without plaque disease and reports that the clot was "sucked out" and a stent was placed.  Initially had cardiac follow-up for the first several years but has not seen a cardiologist in some time.  He is not had pain like this in a very severe time and this pain developed this morning.  He reports some associated shortness of breath.  Denies fevers and chills.  No productive cough.  His pain is severe at this time    Past Medical History:  Diagnosis Date  . CAD (coronary artery disease)   . Cardiac murmur   . Chronic kidney disease, stage III (moderate)   . Depression   . Elevated SGOT   . Hyperlipidemia   . Hypertension   . 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 Sleep apnea   . Thyroid disease     Patient Active Problem List   Diagnosis Date Noted  . 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 Sleep apnea 07/13/2015  . Elevated uric acid in blood 07/13/2015  . Hyperlipidemia   . Hypertension   . Depression   . Thyroid disease     Past Surgical History:  Procedure Laterality Date  . LASER ABLATION Right    leg  . phlebectomy Right    leg  . SHOULDER SURGERY         Home Medications    Prior to Admission medications   Medication Sig Start Date End Date Taking? Authorizing Provider    ALPRAZolam Duanne Moron) 1 MG tablet Take 1 tablet (1 mg total) by mouth 2 (two) times daily as needed. 04/06/16   Guadalupe Maple, MD  aspirin EC 325 MG tablet Take 325 mg by mouth daily.    Historical Provider, MD  liothyronine (CYTOMEL) 25 MCG tablet Take 3 tablets (75 mcg total) by mouth daily. 04/06/16   Guadalupe Maple, MD  nefazodone (SERZONE) 150 MG tablet Take 1 tablet (150 mg total) by mouth 2 (two) times daily. 04/06/16   Guadalupe Maple, MD  ramipril (ALTACE) 10 MG capsule TAKE 2 CAPSULES BY MOUTH DAILY. 04/06/16   Guadalupe Maple, MD  sertraline (ZOLOFT) 100 MG tablet Take 2 tablets (200 mg total) by mouth 2 (two) times daily. 04/06/16   Guadalupe Maple, MD  testosterone cypionate (DEPOTESTOSTERONE CYPIONATE) 200 MG/ML injection Inject 0.5 mLs (100 mg total) into the muscle once a week. 04/06/16   Guadalupe Maple, MD  torsemide (DEMADEX) 20 MG tablet Take 1 tablet (20 mg total) by mouth daily. 04/06/16   Guadalupe Maple, MD    Family History Family History  Problem Relation Age of Onset  . Cancer Mother   . Hyperlipidemia Mother   . Cancer Father   . Hyperlipidemia Father   .  AAA (abdominal aortic aneurysm) Father     Social History Social History  Substance Use Topics  . Smoking status: Never Smoker  . Smokeless tobacco: Never Used  . Alcohol use No     Allergies   Amlodipine   Review of Systems Review of Systems  All other systems reviewed and are negative.    Physical Exam Updated Vital Signs BP (!) 152/109   Pulse 89   Temp 97.6 F (36.4 C) (Oral)   Resp 19   Ht 5\' 9"  (1.753 m)   Wt 248 lb (112.5 kg)   SpO2 97%   BMI 36.62 kg/m   Physical Exam  Constitutional: He is oriented to person, place, and time. He appears well-developed and well-nourished.  HENT:  Head: Normocephalic and atraumatic.  Eyes: EOM are normal.  Neck: Normal range of motion.  Cardiovascular: Normal rate, regular rhythm and normal heart sounds.   Pulmonary/Chest: Effort normal and  breath sounds normal. No respiratory distress.  Abdominal: Soft. He exhibits no distension. There is no tenderness.  Musculoskeletal: Normal range of motion.  Neurological: He is alert and oriented to person, place, and time.  Skin: Skin is warm and dry.  Psychiatric: He has a normal mood and affect. Judgment normal.  Nursing note and vitals reviewed.    ED Treatments / Results  Labs (all labs ordered are listed, but only abnormal results are displayed) Labs Reviewed  BASIC METABOLIC PANEL - Abnormal; Notable for the following:       Result Value   CO2 20 (*)    Glucose, Bld 127 (*)    BUN 66 (*)    Creatinine, Ser 4.02 (*)    Calcium 8.5 (*)    GFR calc non Af Amer 16 (*)    GFR calc Af Amer 19 (*)    All other components within normal limits  CBC - Abnormal; Notable for the following:    Hemoglobin 17.3 (*)    HCT 52.5 (*)    All other components within normal limits  I-STAT CHEM 8, ED - Abnormal; Notable for the following:    BUN 64 (*)    Creatinine, Ser 3.90 (*)    Glucose, Bld 121 (*)    Calcium, Ion 1.11 (*)    Hemoglobin 17.7 (*)    All other components within normal limits  CBC  DIFFERENTIAL  PROTIME-INR  APTT  COMPREHENSIVE METABOLIC PANEL  TROPONIN I  LIPID PANEL  HEMOGLOBIN A1C  I-STAT TROPOININ, ED    EKG  EKG Interpretation #1  Date/Time:  Thursday June 09 2016 09:06:57 EDT Ventricular Rate:  81 PR Interval:  198 QRS Duration: 118 QT Interval:  382 QTC Calculation: 443 R Axis:   -72 Text Interpretation:  Normal sinus rhythm Left axis deviation Inferior infarct , age undetermined Anterolateral infarct , age undetermined Abnormal ECG Left bundle branch block No significant change was found Confirmed by Chayne Baumgart  MD, Demeisha Geraghty ([id number]) on 06/09/2016 9:20:42 AM       EKG Interpretation #2  Date/Time:  Thursday June 09 2016 09:34:35 EDT Ventricular Rate:  81 PR Interval:  198 QRS Duration: 122 QT Interval:  394 QTC Calculation: 458 R  Axis:   -82 Text Interpretation:  Sinus rhythm Nonspecific IVCD with LAD Left ventricular hypertrophy dynamic ecg changes as compared to earlier ecg with changes in V3-V6 Confirmed by Jacobi Ryant  MD, Lennette Bihari ([id number]) on 06/09/2016 10:08:28 AM         Radiology No results found.  Procedures Procedures (  including critical care time)   ++++++++++++++++++++++++++++++++++++++++++++++++  CRITICAL CARE Performed by: Hoy Morn Total critical care time: 33 minutes Critical care time was exclusive of separately billable procedures and treating other patients. Critical care was necessary to treat or prevent imminent or life-threatening deterioration. Critical care was time spent personally by me on the following activities: development of treatment plan with patient and/or surrogate as well as nursing, discussions with consultants, evaluation of patient's response to treatment, examination of patient, obtaining history from patient or surrogate, ordering and performing treatments and interventions, ordering and review of laboratory studies, ordering and review of radiographic studies, pulse oximetry and re-evaluation of patient's condition.   Medications Ordered in ED Medications  aspirin chewable tablet 324 mg (324 mg Oral Given 06/09/16 0944)  morphine 4 MG/ML injection 4 mg (4 mg Intravenous Given 06/09/16 0945)  heparin injection 4,000 Units (4,000 Units Intravenous Given 06/09/16 0947)     Initial Impression / Assessment and Plan / ED Course  I have reviewed the triage vital signs and the nursing notes.  Pertinent labs & imaging results that were available during my care of the patient were reviewed by me and considered in my medical decision making (see chart for details).  Clinical Course    Patient's story concerning for acute coronary syndrome.  Serial EKGs demonstrate what appears to be dynamic and worsening EKG changes.  No great alternative diagnosis at this time.  Given his  severe anterior chest pain I think it is best that is evaluated in the Cath Lab emergently.  Code STEMI called.  I spoke with the interventional cardiologist who will see the patient in the Cath Lab.  Aspirin and heparin now  Final Clinical Impressions(s) / ED Diagnoses   Final diagnoses:  Acute coronary syndrome [facility])    New Prescriptions Current Discharge Medication List       Jola Schmidt, MD 06/09/16 1011

## 2016-06-09 NOTE — Progress Notes (Signed)
CRITICAL VALUE ALERT  Critical value received:  Trop 1.51  Date of notification:  06/09/16  Time of notification:  [id number]  Critical value read back:Yes.    Nurse who received alert: San Jetty RN  MD notified (1st page):  Dr. Angelena Form  Expected Value post STEMI and card cath. No new orders given.

## 2016-06-10 ENCOUNTER — Inpatient Hospital Stay (HOSPITAL_COMMUNITY): Payer: BLUE CROSS/BLUE SHIELD

## 2016-06-10 DIAGNOSIS — I10 Essential (primary) hypertension: Secondary | ICD-10-CM

## 2016-06-10 DIAGNOSIS — I129 Hypertensive chronic kidney disease with stage 1 through stage 4 chronic kidney disease, or unspecified chronic kidney disease: Secondary | ICD-10-CM

## 2016-06-10 DIAGNOSIS — E785 Hyperlipidemia, unspecified: Secondary | ICD-10-CM

## 2016-06-10 DIAGNOSIS — I213 ST elevation (STEMI) myocardial infarction of unspecified site: Secondary | ICD-10-CM

## 2016-06-10 LAB — ECHOCARDIOGRAM COMPLETE
CHL CUP MV DEC (S): 173
E decel time: 173 msec
E/e' ratio: 9.59
FS: 31 % (ref 28–44)
Height: 69 in
IVS/LV PW RATIO, ED: 1.12
LA ID, A-P, ES: 47 mm
LA diam end sys: 47 mm
LA vol: 85.1 mL
LADIAMINDEX: 2.08 cm/m2
LAVOLA4C: 71.6 mL
LAVOLIN: 37.7 mL/m2
LV E/e'average: 9.59
LV PW d: 10.6 mm — AB (ref 0.6–1.1)
LVEEMED: 9.59
LVELAT: 12.1 cm/s
LVOT area: 3.46 cm2
LVOT diameter: 21 mm
MRPISAEROA: 0.12 cm2
MV Peak grad: 5 mmHg
MV VTI: 153 cm
MV pk A vel: 94.6 m/s
MVPKEVEL: 116 m/s
RV LATERAL S' VELOCITY: 11.1 cm/s
TAPSE: 19.3 mm
TDI e' lateral: 12.1
TDI e' medial: 6.53
Weight: 3968 oz

## 2016-06-10 LAB — CBC
HCT: 52.7 % — ABNORMAL HIGH (ref 39.0–52.0)
HEMOGLOBIN: 17.6 g/dL — AB (ref 13.0–17.0)
MCH: 30 pg (ref 26.0–34.0)
MCHC: 33.4 g/dL (ref 30.0–36.0)
MCV: 89.8 fL (ref 78.0–100.0)
PLATELETS: 167 10*3/uL (ref 150–400)
RBC: 5.87 MIL/uL — AB (ref 4.22–5.81)
RDW: 13.1 % (ref 11.5–15.5)
WBC: 12.5 10*3/uL — AB (ref 4.0–10.5)

## 2016-06-10 LAB — BASIC METABOLIC PANEL
ANION GAP: 10 (ref 5–15)
BUN: 54 mg/dL — ABNORMAL HIGH (ref 6–20)
CALCIUM: 9 mg/dL (ref 8.9–10.3)
CHLORIDE: 109 mmol/L (ref 101–111)
CO2: 20 mmol/L — AB (ref 22–32)
Creatinine, Ser: 3.35 mg/dL — ABNORMAL HIGH (ref 0.61–1.24)
GFR calc non Af Amer: 20 mL/min — ABNORMAL LOW (ref >60)
GFR, EST AFRICAN AMERICAN: 23 mL/min — AB (ref >60)
Glucose, Bld: 85 mg/dL (ref 65–99)
Potassium: 5.3 mmol/L — ABNORMAL HIGH (ref 3.5–5.1)
SODIUM: 139 mmol/L (ref 135–145)

## 2016-06-10 LAB — URINE MICROSCOPIC-ADD ON

## 2016-06-10 LAB — URINALYSIS, ROUTINE W REFLEX MICROSCOPIC
BILIRUBIN URINE: NEGATIVE
Glucose, UA: NEGATIVE mg/dL
Ketones, ur: NEGATIVE mg/dL
Leukocytes, UA: NEGATIVE
Nitrite: NEGATIVE
PROTEIN: 100 mg/dL — AB
SPECIFIC GRAVITY, URINE: 1.018 (ref 1.005–1.030)
pH: 5 (ref 5.0–8.0)

## 2016-06-10 LAB — HEMOGLOBIN A1C
Hgb A1c MFr Bld: 5.4 % (ref 4.8–5.6)
Mean Plasma Glucose: 108 mg/dL

## 2016-06-10 MED ORDER — ALPRAZOLAM 0.5 MG PO TABS: 1.0000 mg | ORAL_TABLET | Freq: Two times a day (BID) | ORAL | Status: DC | PRN

## 2016-06-10 MED ORDER — LIOTHYRONINE SODIUM 25 MCG PO TABS: 50.0000 ug | ORAL_TABLET | Freq: Every day | ORAL | Status: DC

## 2016-06-10 MED ORDER — ALPRAZOLAM 0.5 MG PO TABS
1.0000 mg | ORAL_TABLET | Freq: Two times a day (BID) | ORAL | Status: DC
  Administered 2016-06-10 – 2016-06-11 (×2): 1 mg via ORAL
  Filled 2016-06-10 (×2): qty 2

## 2016-06-10 MED ORDER — LIOTHYRONINE SODIUM 25 MCG PO TABS
50.0000 ug | ORAL_TABLET | Freq: Every day | ORAL | Status: DC
  Administered 2016-06-11: 50 ug via ORAL
  Filled 2016-06-10: qty 2

## 2016-06-10 MED FILL — Heparin Sodium (Porcine) Inj 1000 Unit/ML: INTRAMUSCULAR | Qty: 10 | Status: AC

## 2016-06-10 MED FILL — Ticagrelor Tab 90 MG: ORAL | Qty: 1 | Status: AC

## 2016-06-10 NOTE — Progress Notes (Addendum)
Bruising noted at 2200. Reinforced importance of keeping radial site elevated for 24 hours post TR band removal. No hematoma present and bruising has not worsened. Pt noncompliant while sleeping. Attempted to keep elevated by repositioning with pillow during rounding.

## 2016-06-10 NOTE — Progress Notes (Signed)
CARDIAC REHAB PHASE I   PRE:  Rate/Rhythm: 77 SR  BP:  Sitting: 118/75        SaO2: 98 RA  MODE:  Ambulation: 700 ft   POST:  Rate/Rhythm: 91 SR  BP:  Sitting: 132/88         SaO2: 98 RA  Pt in recliner, states he is "sleepy from the metoprolol." Pt ambulated 700 ft on RA, independent, steady gait, tolerated well with no complaints other than caffeine withdrawal headache. Completed MI/stent education.  Reviewed risk factors, MI book, anti-platelet therapy, stent card, activity restrictions, ntg, exercise, heart healthy diet, portion control, sodium restrictions, daily weights, s/s heart failure, and phase 2 cardiac rehab. Pt verbalized understanding, receptive to education. Pt does however, state that he will not take a statin for cholesterol. Pt agrees to phase 2 cardiac rehab referral, will send to [facility] per pt request. Pt to recliner after walk, call bell within reach. Will follow.  [place], RN, BSN 06/10/2016 3:26 PM

## 2016-06-10 NOTE — Progress Notes (Signed)
       Patient Name: Dylan Chandler Date of Encounter: 06/10/2016    SUBJECTIVE: Relatively young gentleman who appears healthy and physically fit. Presented with ACS. Prior history of RCA stent 10 years ago  TELEMETRY:  Normal sinus rhythm Vitals:   06/10/16 0500 06/10/16 0600 06/10/16 0700 06/10/16 0800  BP: 126/79 124/75 115/69 125/88  Pulse: 82 79 83 77  Resp: 17 15 18 10   Temp:    98.2 F (36.8 C)  TempSrc:    Oral  SpO2: 90% 93% 90% 90%  Weight:      Height:        Intake/Output Summary (Last 24 hours) at 06/10/16 1005 Last data filed at 06/10/16 0800  Gross per 24 hour  Intake          1066.01 ml  Output             2825 ml  Net         -1758.99 ml   LABS: Basic Metabolic Panel:  Recent Labs  06/09/16 1107 06/10/16 0327  NA 136 139  K 5.2* 5.3*  CL 107 109  CO2 21* 20*  GLUCOSE 118* 85  BUN 64* 54*  CREATININE 3.83* 3.35*  CALCIUM 8.5* 9.0   CBC:  Recent Labs  06/09/16 1107 06/10/16 0327  WBC 8.6 12.5*  NEUTROABS 6.8  --   HGB 17.0 17.6*  HCT 50.1 52.7*  MCV 89.8 89.8  PLT 148* 167   Cardiac Enzymes:  Recent Labs  06/09/16 1107 06/09/16 1728 06/09/16 2251  TROPONINI 1.51*  1.40* 62.69* 63.61*   BNP: Invalid input(s): POCBNP Hemoglobin A1C:  Recent Labs  06/09/16 1107  HGBA1C 5.4   Fasting Lipid Panel:  Recent Labs  06/09/16 1107  CHOL 198  HDL 19*  LDLCALC 153*  TRIG 130  CHOLHDL 10.4    Radiology/Studies:  Coronary Angiography Diagnostic Diagram     Post-Intervention Diagram        LVEDP 13 mmHg. Left ventriculography was not performed.   Physical Exam: Blood pressure 125/88, pulse 77, temperature 98.2 F (36.8 C), temperature source Oral, resp. rate 10, height 5\' 9"  (1.753 m), weight 248 lb (112.5 kg), SpO2 90 %. Weight change:   Wt Readings from Last 3 Encounters:  06/09/16 248 lb (112.5 kg)  04/06/16 243 lb (110.2 kg)  10/13/15 248 lb (112.5 kg)   Chest reveals basilar crackles  bilaterally. S4 gallop. Somewhat stocky with heavy musculature.  ASSESSMENT:  1. Known coronary artery disease with prior RCA stent presenting with anterior ST elevation MI treated with drug-eluting stent with beautiful in result. 2. LV systolic function currently unknown. Suspect chronic diastolic heart failure due to LVH and CAD. Hemodynamics were normal at the time of cath. 3. Chronic kidney disease, stage IV: Mild hyperkalemia and metabolic acidosis currently present. 4. History of hypertension 5. Hyperlipidemia. 6. Abnormal EKG persisting post angiography with persistent S elevation and interventricular conduction delay. 7. Erythrocytosis 8. Hyperkalemia, needs to be watched closely given kidney function.  Plan:  1. 2-D Doppler echocardiogram: "LV function" 2. Nephrology consult to establish relationship and help if required following contrast load during the acute MI last p.m. Currently creatinine is improved compared to admission but will likely trend upward 3. Risk factor modification: Aggressive blood pressure control, lipid management, smoking cessation, glycemic control, etc. 4. Will hold Altace until certain that kidney function remains stable.   Signed, Belva Crome III 06/10/2016, 10:05 AM

## 2016-06-10 NOTE — Consult Note (Signed)
Reason for Consult: AKI Referring Physician:   Daulton Harbaugh Xxxmccormick is an 50 y.o. male.  HPI: Patient is a 50 yo M with a PMHx of CAD with prior stenting of RCA in 2008 at [facility], CKD, HTN, HLD, OSA presenting to the hospital on 7/27 for chest pain. He has elevated troponins and EKG changes consistent with anterior wall STEMI. Patent was taken to the cath lab and treated with a drug eluding stent. He received contrast during the procedure and was found to have metabolic acidosis and mild hyperkalemia after the procedure. SCr 4.0 on admission and 3.8 > 3.3 after the procedure. As per chart, patient has CKD stage IV (creatinine 3.6 and GFR 19 in 03/2016). Care everywhere records from [place] reveal patient had stage III CKD (in the setting of heavy NSAID use) and sub-nephrotic proteinuria in 2015. Renal ultrasound was normal at that time.  Patient denies having any uremic symptoms prior to this hospitalization: no nausea, vomiting, decreased appetite, weight loss, fatigue, pruritis, or muscle cramps. Reports having good UOP. Denies NSAID use or high protein intake. Does report having foamy urine. At present, followed by Dr. Holley Raring in [place]. On ACEI for bp (?proteinuria)   Past Medical History:  Diagnosis Date  . CAD (coronary artery disease)   . Cardiac murmur   . Chronic kidney disease, stage III (moderate)   . Depression   . Elevated SGOT   . Hyperlipidemia   . Hypertension   . 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 Sleep apnea   . Thyroid disease     Past Surgical History:  Procedure Laterality Date  . CARDIAC CATHETERIZATION N/A 06/09/2016   Procedure: Left Heart Cath and Coronary Angiography;  Surgeon: Burnell Blanks, MD;  Location: [place] [facility];  Service: Cardiovascular;  Laterality: N/A;  . CARDIAC CATHETERIZATION N/A 06/09/2016   Procedure: Coronary Stent Intervention;  Surgeon: Burnell Blanks, MD;  Location: [place] [facility];  Service: Cardiovascular;   Laterality: N/A;  . LASER ABLATION Right    leg  . phlebectomy Right    leg  . SHOULDER SURGERY      Family History  Problem Relation Age of Onset  . Cancer Mother   . Hyperlipidemia Mother   . Cancer Father   . Hyperlipidemia Father   . AAA (abdominal aortic aneurysm) Father     Social History:  reports that he has never smoked. He has never used smokeless tobacco. He reports that he does not drink alcohol or use drugs.  Allergies:  Allergies  Allergen Reactions  . Amlodipine Swelling    Results for orders placed or performed during the hospital encounter of 06/09/16 (from the past 48 hour(s))  Basic metabolic panel     Status: Abnormal   Collection Time: 06/09/16  9:20 AM  Result Value Ref Range   Sodium 136 135 - 145 mmol/L   Potassium 4.4 3.5 - 5.1 mmol/L   Chloride 106 101 - 111 mmol/L   CO2 20 (L) 22 - 32 mmol/L   Glucose, Bld 127 (H) 65 - 99 mg/dL   BUN 66 (H) 6 - 20 mg/dL   Creatinine, Ser 4.02 (H) 0.61 - 1.24 mg/dL   Calcium 8.5 (L) 8.9 - 10.3 mg/dL   GFR calc non Af Amer 16 (L) >60 mL/min   GFR calc Af Amer 19 (L) >60 mL/min    Comment: (NOTE) The eGFR has been calculated using the CKD EPI equation. This calculation has not been validated in  all clinical situations. eGFR's persistently <60 mL/min signify possible Chronic Kidney Disease.    Anion gap 10 5 - 15  CBC     Status: Abnormal   Collection Time: 06/09/16  9:20 AM  Result Value Ref Range   WBC 7.8 4.0 - 10.5 K/uL   RBC 5.75 4.22 - 5.81 MIL/uL   Hemoglobin 17.3 (H) 13.0 - 17.0 g/dL   HCT 52.5 (H) 39.0 - 52.0 %   MCV 91.3 78.0 - 100.0 fL   MCH 30.1 26.0 - 34.0 pg   MCHC 33.0 30.0 - 36.0 g/dL   RDW 13.0 11.5 - 15.5 %   Platelets 156 150 - 400 K/uL  I-stat troponin, ED     Status: None   Collection Time: 06/09/16  9:24 AM  Result Value Ref Range   Troponin i, poc 0.03 0.00 - 0.08 ng/mL   Comment 3            Comment: Due to the release kinetics of cTnI, a negative result within the first  hours of the onset of symptoms does not rule out myocardial infarction with certainty. If myocardial infarction is still suspected, repeat the test at appropriate intervals.   I-stat chem 8, ed     Status: Abnormal   Collection Time: 06/09/16  9:47 AM  Result Value Ref Range   Sodium 141 135 - 145 mmol/L   Potassium 4.4 3.5 - 5.1 mmol/L   Chloride 106 101 - 111 mmol/L   BUN 64 (H) 6 - 20 mg/dL   Creatinine, Ser 3.90 (H) 0.61 - 1.24 mg/dL   Glucose, Bld 121 (H) 65 - 99 mg/dL   Calcium, Ion 1.11 (L) 1.13 - 1.30 mmol/L   TCO2 22 0 - 100 mmol/L   Hemoglobin 17.7 (H) 13.0 - 17.0 g/dL   HCT 52.0 39.0 - 52.0 %  POCT Activated clotting time     Status: None   Collection Time: 06/09/16 10:20 AM  Result Value Ref Range   Activated Clotting Time 538 seconds  CBC     Status: Abnormal   Collection Time: 06/09/16 11:07 AM  Result Value Ref Range   WBC 8.6 4.0 - 10.5 K/uL   RBC 5.58 4.22 - 5.81 MIL/uL   Hemoglobin 17.0 13.0 - 17.0 g/dL   HCT 50.1 39.0 - 52.0 %   MCV 89.8 78.0 - 100.0 fL   MCH 30.5 26.0 - 34.0 pg   MCHC 33.9 30.0 - 36.0 g/dL   RDW 13.0 11.5 - 15.5 %   Platelets 148 (L) 150 - 400 K/uL  Differential     Status: None   Collection Time: 06/09/16 11:07 AM  Result Value Ref Range   Neutrophils Relative % 79 %   Neutro Abs 6.8 1.7 - 7.7 K/uL   Lymphocytes Relative 15 %   Lymphs Abs 1.3 0.7 - 4.0 K/uL   Monocytes Relative 5 %   Monocytes Absolute 0.4 0.1 - 1.0 K/uL   Eosinophils Relative 1 %   Eosinophils Absolute 0.1 0.0 - 0.7 K/uL   Basophils Relative 0 %   Basophils Absolute 0.0 0.0 - 0.1 K/uL  Protime-INR     Status: Abnormal   Collection Time: 06/09/16 11:07 AM  Result Value Ref Range   Prothrombin Time 24.7 (H) 11.4 - 15.2 seconds   INR 2.19   APTT     Status: Abnormal   Collection Time: 06/09/16 11:07 AM  Result Value Ref Range   aPTT 166 (HH) 24 - 36  seconds    Comment:        IF BASELINE aPTT IS ELEVATED, SUGGEST PATIENT RISK ASSESSMENT BE USED TO  DETERMINE APPROPRIATE ANTICOAGULANT THERAPY. REPEATED TO VERIFY CRITICAL RESULT CALLED TO, READ BACK BY AND VERIFIED WITH: H PADGETT,RN 1210 06/09/16 D BRADLEY   Comprehensive metabolic panel     Status: Abnormal   Collection Time: 06/09/16 11:07 AM  Result Value Ref Range   Sodium 136 135 - 145 mmol/L   Potassium 5.2 (H) 3.5 - 5.1 mmol/L   Chloride 107 101 - 111 mmol/L   CO2 21 (L) 22 - 32 mmol/L   Glucose, Bld 118 (H) 65 - 99 mg/dL   BUN 64 (H) 6 - 20 mg/dL   Creatinine, Ser 3.83 (H) 0.61 - 1.24 mg/dL   Calcium 8.5 (L) 8.9 - 10.3 mg/dL   Total Protein 6.3 (L) 6.5 - 8.1 g/dL   Albumin 3.6 3.5 - 5.0 g/dL   AST 48 (H) 15 - 41 U/L   ALT 33 17 - 63 U/L   Alkaline Phosphatase 54 38 - 126 U/L   Total Bilirubin 0.7 0.3 - 1.2 mg/dL   GFR calc non Af Amer 17 (L) >60 mL/min   GFR calc Af Amer 20 (L) >60 mL/min    Comment: (NOTE) The eGFR has been calculated using the CKD EPI equation. This calculation has not been validated in all clinical situations. eGFR's persistently <60 mL/min signify possible Chronic Kidney Disease.    Anion gap 8 5 - 15  Troponin I     Status: Abnormal   Collection Time: 06/09/16 11:07 AM  Result Value Ref Range   Troponin I 1.40 (HH) <0.03 ng/mL    Comment: CRITICAL RESULT CALLED TO, READ BACK BY AND VERIFIED WITH: PADGETT,H RN @ 1610 06/09/16 LEONARD,A   Lipid panel     Status: Abnormal   Collection Time: 06/09/16 11:07 AM  Result Value Ref Range   Cholesterol 198 0 - 200 mg/dL   Triglycerides 130 <150 mg/dL   HDL 19 (L) >40 mg/dL   Total CHOL/HDL Ratio 10.4 RATIO   VLDL 26 0 - 40 mg/dL   LDL Cholesterol 153 (H) 0 - 99 mg/dL    Comment:        Total Cholesterol/HDL:CHD Risk Coronary Heart Disease Risk Table                     Men   Women  1/2 Average Risk   3.4   3.3  Average Risk       5.0   4.4  2 X Average Risk   9.6   7.1  3 X Average Risk  23.4   11.0        Use the calculated Patient Ratio above and the CHD Risk Table to determine the  patient's CHD Risk.        ATP III CLASSIFICATION (LDL):  <100     mg/dL   Optimal  100-129  mg/dL   Near or Above                    Optimal  130-159  mg/dL   Borderline  160-189  mg/dL   High  >190     mg/dL   Very High   Hemoglobin A1c     Status: None   Collection Time: 06/09/16 11:07 AM  Result Value Ref Range   Hgb A1c MFr Bld 5.4 4.8 - 5.6 %  Comment: (NOTE)         Pre-diabetes: 5.7 - 6.4         Diabetes: >6.4         Glycemic control for adults with diabetes: <7.0    Mean Plasma Glucose 108 mg/dL    Comment: (NOTE) Performed At: [facility] [place], [place] [id number] Lindon Romp MD [id number]   Troponin I (serum)     Status: Abnormal   Collection Time: 06/09/16 11:07 AM  Result Value Ref Range   Troponin I 1.51 (HH) <0.03 ng/mL    Comment: CRITICAL VALUE NOTED.  VALUE IS CONSISTENT WITH PREVIOUSLY REPORTED AND CALLED VALUE.  MRSA PCR Screening     Status: None   Collection Time: 06/09/16 11:10 AM  Result Value Ref Range   MRSA by PCR NEGATIVE NEGATIVE    Comment:        The GeneXpert MRSA Assay (FDA approved for NASAL specimens only), is one component of a comprehensive MRSA colonization surveillance program. It is not intended to diagnose MRSA infection nor to guide or monitor treatment for MRSA infections.   Troponin I (serum)     Status: Abnormal   Collection Time: 06/09/16  5:28 PM  Result Value Ref Range   Troponin I 62.69 (HH) <0.03 ng/mL    Comment: CRITICAL RESULT CALLED TO, READ BACK BY AND VERIFIED WITH: L FLOOD,RN 1844 06/09/2016 WBOND DELTA CHECK NOTED   Troponin I (serum)     Status: Abnormal   Collection Time: 06/09/16 10:51 PM  Result Value Ref Range   Troponin I 63.61 (HH) <0.03 ng/mL    Comment: CRITICAL VALUE NOTED.  VALUE IS CONSISTENT WITH PREVIOUSLY REPORTED AND CALLED VALUE.  Basic metabolic panel     Status: Abnormal   Collection Time: 06/10/16  3:27 AM  Result Value Ref Range   Sodium 139  135 - 145 mmol/L   Potassium 5.3 (H) 3.5 - 5.1 mmol/L   Chloride 109 101 - 111 mmol/L   CO2 20 (L) 22 - 32 mmol/L   Glucose, Bld 85 65 - 99 mg/dL   BUN 54 (H) 6 - 20 mg/dL   Creatinine, Ser 3.35 (H) 0.61 - 1.24 mg/dL   Calcium 9.0 8.9 - 10.3 mg/dL   GFR calc non Af Amer 20 (L) >60 mL/min   GFR calc Af Amer 23 (L) >60 mL/min    Comment: (NOTE) The eGFR has been calculated using the CKD EPI equation. This calculation has not been validated in all clinical situations. eGFR's persistently <60 mL/min signify possible Chronic Kidney Disease.    Anion gap 10 5 - 15  CBC     Status: Abnormal   Collection Time: 06/10/16  3:27 AM  Result Value Ref Range   WBC 12.5 (H) 4.0 - 10.5 K/uL   RBC 5.87 (H) 4.22 - 5.81 MIL/uL   Hemoglobin 17.6 (H) 13.0 - 17.0 g/dL   HCT 52.7 (H) 39.0 - 52.0 %   MCV 89.8 78.0 - 100.0 fL   MCH 30.0 26.0 - 34.0 pg   MCHC 33.4 30.0 - 36.0 g/dL   RDW 13.1 11.5 - 15.5 %   Platelets 167 150 - 400 K/uL    No results found.  ROS  Pertinent positives mentioned in HPI. Remainder of ROS negative.  Blood pressure (!) 137/93, pulse 76, temperature 97.8 F (36.6 C), temperature source Oral, resp. rate 19, height '5\' 9"'$  (1.753 m), weight 248 lb (112.5 kg), SpO2 96 %. Physical  Exam  Constitutional: He is oriented to person, place, and time. He appears well-developed and well-nourished. No distress.  Sitting in bedside chair  HENT:  Head: Normocephalic and atraumatic.  Eyes: EOM are normal.  Neck: Neck supple. No tracheal deviation present.  Cardiovascular: Normal rate, regular rhythm and intact distal pulses.  Exam reveals no gallop and no friction rub.   No murmur heard. Distant heart sounds   Respiratory: Breath sounds normal. No respiratory distress. He has no wheezes. He has no rales.  GI: Soft. Bowel sounds are normal. He exhibits no distension. There is no tenderness. There is no guarding.  Musculoskeletal: He exhibits no edema or deformity.  Neurological: He is  alert and oriented to person, place, and time.  Skin: Skin is warm and dry.  Fundi benign Pharynx unremarkable Gr 2/6 SEM Stasis changes LE Assessment/Plan:  AKI on CKD IV Patient has a history of CKD in the setting of heavy NSAID use and sub-nephrotic range proteinuria. Labs from 03/2016 c/w CKD stage IV. He received contrast dye for cardiac cath in the setting of STEMI on 7/27. After the procedure, labs indicating metabolic acidosis and mild hyperkalemia (K 5.2 > 5.3). SCr 4.0 on admission and did not worsen after the procedure (3.8 > 3.3) likely due to aggressive IVF hydration. Patient denies any uremic symptoms and reports having good UOP. He denies any NSAID use (? because he has a hx chronic shoulder pain). 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  -No need for emergent dialysis at this time. Continue to monitor renal function.  -Renal ultrasound -Urinalysis -Renal function panel in am  -LDH -Strict I/Os  -Daily weights Suspect at baseline Cr, will follow as is still at risk for AKI.  Need to look at kidneys with U/S, look at UA, eval for secondary effects of CKD.  GFR probably higher than calc value as has large ms mass  Bones Calcium is normal. -Will check phos: renal function panel in am -Check PTH, will start Calcitriol if PTH high   Erythrocytosis Hgb 17.6 today. Seems to be chronic as records from [facility] showing Hgb >17 in 2015. Erythrocytosis likely in the setting of chronic steroid use. Depotestosterone weekly injection on home meds.  -CBC with diff in am   CAD Hx of stent to RCA in 2008. Admitted now for acute anterior wall STEMI 2/2 sub-occluded mid LAD. S./p PTCA/DES x 1 mid LAD. Cath showing patent stent in the distal RCA. Non-obstructive disease noted in the RCA and circumflex.  -Continue ASA, statin, BB, Brilinta   HTN BP stable. -Continue Metoprolol -Continue Torsemide   -May resume Ramipril in 4-5 days if renal function stays stable    HLD -Continue statin   Shela Leff 06/10/2016, 2:28 PM  I have seen and examined this patient and agree with the plan of care seen, eval, examined counseled patient. Discussed with resident. .  Harmony Sandell L 06/10/2016, 4:33 PM

## 2016-06-10 NOTE — Progress Notes (Signed)
  Echocardiogram 2D Echocardiogram has been performed.  Dylan Chandler 06/10/2016, 10:50 AM

## 2016-06-10 NOTE — Care Management Note (Addendum)
Case Management Note  Patient Details  Name: Jori Regula Xxxmccormick MRN: [id number] Date of Birth: May 20, 1966  Subjective/Objective:     Pt admitted with CP               Action/Plan:  PTA independent from home alone.  Pt may possibly discharge home on Effient.  CM provided pt free 30 Day card and discount coupon card - pt is not a recipient of Medicare nor Medicaid.  CM submitted benefit check.  CM contacted [facility] choice [facility] [place] confirmed they have the inventory to fill the prescription at discharge   Expected Discharge Date:                  Expected Discharge Plan:  Home/Self Care  In-House Referral:     Discharge planning Services  CM Consult  Post Acute Care Choice:    Choice offered to:     DME Arranged:    DME Agency:     HH Arranged:    [place] Agency:     Status of Service:  In process, will continue to follow  If discussed at Long Length of Stay Meetings, dates discussed:    Additional Comments: Benefit check EFFIENT 10 MG DAILY ( 30 ) 30 TAB   COVER- YES  CO-PAY- $ 44.13  TIER- 3 DRUG  PRIOR APPROVAL - NO  PHARMACY [facility]  Information shared with pt    Maryclare Labrador, RN 06/10/2016, 10:19 AM

## 2016-06-10 NOTE — Progress Notes (Signed)
Pt refused his Lipitor tonight. He stated it causes him to have weakness in his extremities. It is documented that he has refused previous doses as well.

## 2016-06-10 NOTE — Progress Notes (Signed)
EKG CRITICAL VALUE     12 lead EKG performed.  Critical value noted.  Earnest Bailey, RN notified.   Yehuda Mao, CCT 06/10/2016 8:45 AM

## 2016-06-11 ENCOUNTER — Other Ambulatory Visit: Payer: Self-pay | Admitting: Adult Health

## 2016-06-11 DIAGNOSIS — N179 Acute kidney failure, unspecified: Secondary | ICD-10-CM

## 2016-06-11 LAB — CBC WITH DIFFERENTIAL/PLATELET
BASOS PCT: 0 %
Basophils Absolute: 0 10*3/uL (ref 0.0–0.1)
EOS ABS: 0.2 10*3/uL (ref 0.0–0.7)
Eosinophils Relative: 1 %
HCT: 55.1 % — ABNORMAL HIGH (ref 39.0–52.0)
HEMOGLOBIN: 17.8 g/dL — AB (ref 13.0–17.0)
LYMPHS ABS: 2.7 10*3/uL (ref 0.7–4.0)
Lymphocytes Relative: 22 %
MCH: 29.5 pg (ref 26.0–34.0)
MCHC: 32.3 g/dL (ref 30.0–36.0)
MCV: 91.2 fL (ref 78.0–100.0)
MONOS PCT: 10 %
Monocytes Absolute: 1.2 10*3/uL — ABNORMAL HIGH (ref 0.1–1.0)
NEUTROS PCT: 67 %
Neutro Abs: 8 10*3/uL — ABNORMAL HIGH (ref 1.7–7.7)
Platelets: 181 10*3/uL (ref 150–400)
RBC: 6.04 MIL/uL — AB (ref 4.22–5.81)
RDW: 13.1 % (ref 11.5–15.5)
WBC: 12 10*3/uL — ABNORMAL HIGH (ref 4.0–10.5)

## 2016-06-11 LAB — RENAL FUNCTION PANEL
ALBUMIN: 3.6 g/dL (ref 3.5–5.0)
ANION GAP: 7 (ref 5–15)
BUN: 52 mg/dL — ABNORMAL HIGH (ref 6–20)
CALCIUM: 9.1 mg/dL (ref 8.9–10.3)
CO2: 23 mmol/L (ref 22–32)
Chloride: 106 mmol/L (ref 101–111)
Creatinine, Ser: 3.66 mg/dL — ABNORMAL HIGH (ref 0.61–1.24)
GFR, EST AFRICAN AMERICAN: 21 mL/min — AB (ref >60)
GFR, EST NON AFRICAN AMERICAN: 18 mL/min — AB (ref >60)
Glucose, Bld: 86 mg/dL (ref 65–99)
PHOSPHORUS: 3.5 mg/dL (ref 2.5–4.6)
Potassium: 4.6 mmol/L (ref 3.5–5.1)
SODIUM: 136 mmol/L (ref 135–145)

## 2016-06-11 LAB — LACTATE DEHYDROGENASE: LDH: 314 U/L — ABNORMAL HIGH (ref 98–192)

## 2016-06-11 MED ORDER — METOPROLOL TARTRATE 12.5 MG HALF TABLET: 12.5000 mg | ORAL_TABLET | Freq: Two times a day (BID) | ORAL | Status: DC

## 2016-06-11 MED ORDER — RAMIPRIL 5 MG PO CAPS: 10.0000 mg | ORAL_CAPSULE | Freq: Every day | ORAL | Status: DC

## 2016-06-11 MED ORDER — NITROGLYCERIN 0.4 MG SL SUBL: 0.4000 mg | SUBLINGUAL_TABLET | SUBLINGUAL | Status: DC | PRN

## 2016-06-11 MED ORDER — PRASUGREL HCL 10 MG PO TABS: 10.0000 mg | ORAL_TABLET | Freq: Every day | ORAL | 11 refills | Status: DC

## 2016-06-11 MED ORDER — RAMIPRIL 10 MG PO CAPS: 10.0000 mg | ORAL_CAPSULE | Freq: Every day | ORAL | 10 refills | Status: DC

## 2016-06-11 MED ORDER — METOPROLOL TARTRATE 25 MG PO TABS: 12.5000 mg | ORAL_TABLET | Freq: Two times a day (BID) | ORAL | 10 refills | Status: DC

## 2016-06-11 NOTE — Progress Notes (Signed)
PATIENT ID: [id number] with CAD s/p RCA PCI, CKD IV, hypertension, and hyperlipidemia here with STEMI s/p DES to LAD.   SUBJECTIVE:  Feeling well.  Denies chest pain or shortness of breath. Resting comfortably laying flat in the bed.    PHYSICAL EXAM Vitals:   06/10/16 1707 06/10/16 2022 06/11/16 0500 06/11/16 1039  BP: 123/80 116/66 104/74 132/86  Pulse:   63   Resp:  16 16 18   Temp: 98.2 F (36.8 C) 99.1 F (37.3 C) 98.3 F (36.8 C) 98.2 F (36.8 C)  TempSrc:  Oral Oral Oral  SpO2: 100% 95% 97% 96%  Weight:   239 lb 8 oz (108.6 kg)   Height:       General:  Well-appearing. No acute distress Neck: No JVD Lungs:  Clear to auscultation bilaterally. No crackles, rhonchi, or wheezes Heart:  RRR.  II/VI holosystolic murmur at the apex. No rubs or gallops. Abdomen:  Soft, NT, ND.  +BS  Extremities:  WWP.  No edema   LABS: Lab Results  Component Value Date   TROPONINI 63.61 (HH) 06/09/2016   Results for orders placed or performed during the hospital encounter of 06/09/16 (from the past 24 hour(s))  Urinalysis, Routine w reflex microscopic (not at [facility])     Status: Abnormal   Collection Time: 06/10/16  6:35 PM  Result Value Ref Range   Color, Urine YELLOW YELLOW   APPearance CLEAR CLEAR   Specific Gravity, Urine 1.018 1.005 - 1.030   pH 5.0 5.0 - 8.0   Glucose, UA NEGATIVE NEGATIVE mg/dL   Hgb urine dipstick SMALL (A) NEGATIVE   Bilirubin Urine NEGATIVE NEGATIVE   Ketones, ur NEGATIVE NEGATIVE mg/dL   Protein, ur 100 (A) NEGATIVE mg/dL   Nitrite NEGATIVE NEGATIVE   Leukocytes, UA NEGATIVE NEGATIVE  Urine microscopic-add on     Status: Abnormal   Collection Time: 06/10/16  6:35 PM  Result Value Ref Range   Squamous Epithelial / LPF 0-5 (A) NONE SEEN   WBC, UA 0-5 0 - 5 WBC/hpf   RBC / HPF 0-5 0 - 5 RBC/hpf   Bacteria, UA RARE (A) NONE SEEN   Casts GRANULAR CAST (A) NEGATIVE  Lactate dehydrogenase     Status: Abnormal   Collection Time: 06/11/16  3:26 AM  Result Value  Ref Range   LDH 314 (H) 98 - 192 U/L  CBC with Differential/Platelet     Status: Abnormal   Collection Time: 06/11/16  3:26 AM  Result Value Ref Range   WBC 12.0 (H) 4.0 - 10.5 K/uL   RBC 6.04 (H) 4.22 - 5.81 MIL/uL   Hemoglobin 17.8 (H) 13.0 - 17.0 g/dL   HCT 55.1 (H) 39.0 - 52.0 %   MCV 91.2 78.0 - 100.0 fL   MCH 29.5 26.0 - 34.0 pg   MCHC 32.3 30.0 - 36.0 g/dL   RDW 13.1 11.5 - 15.5 %   Platelets 181 150 - 400 K/uL   Neutrophils Relative % 67 %   Neutro Abs 8.0 (H) 1.7 - 7.7 K/uL   Lymphocytes Relative 22 %   Lymphs Abs 2.7 0.7 - 4.0 K/uL   Monocytes Relative 10 %   Monocytes Absolute 1.2 (H) 0.1 - 1.0 K/uL   Eosinophils Relative 1 %   Eosinophils Absolute 0.2 0.0 - 0.7 K/uL   Basophils Relative 0 %   Basophils Absolute 0.0 0.0 - 0.1 K/uL  Renal function panel     Status: Abnormal   Collection Time: 06/11/16  3:26 AM  Result Value Ref Range   Sodium 136 135 - 145 mmol/L   Potassium 4.6 3.5 - 5.1 mmol/L   Chloride 106 101 - 111 mmol/L   CO2 23 22 - 32 mmol/L   Glucose, Bld 86 65 - 99 mg/dL   BUN 52 (H) 6 - 20 mg/dL   Creatinine, Ser 3.66 (H) 0.61 - 1.24 mg/dL   Calcium 9.1 8.9 - 10.3 mg/dL   Phosphorus 3.5 2.5 - 4.6 mg/dL   Albumin 3.6 3.5 - 5.0 g/dL   GFR calc non Af Amer 18 (L) >60 mL/min   GFR calc Af Amer 21 (L) >60 mL/min   Anion gap 7 5 - 15    Intake/Output Summary (Last 24 hours) at 06/11/16 1044 Last data filed at 06/11/16 0900  Gross per 24 hour  Intake              606 ml  Output                0 ml  Net              606 ml    Telemetry:  Sinus rhythm.  No events.   Echo 06/10/16: Study Conclusions  - Left ventricle: The cavity size was moderately dilated. There was   mild concentric hypertrophy. Systolic function was moderately to   severely reduced. The estimated ejection fraction was in the   range of 30% to 35%. Dyskinesis of the apical myocardium.   Hypokinesis of the mid-apicalanteroseptal and anterior   myocardium. Features are  consistent with a pseudonormal left   ventricular filling pattern, with concomitant abnormal relaxation   and increased filling pressure (grade 2 diastolic dysfunction).   No evidence of thrombus. - Mitral valve: Calcified annulus. There was severe regurgitation   directed centrally, eccentrically, and toward the septum. The   acceleration rate of the regurgitant jet was reduced, consistent   with a low dP/dt. Severe regurgitation is suggested by pulmonary   vein systolic flow reversal. - Left atrium: The atrium was moderately dilated.  LHC 06/09/16:  Prox RCA lesion, 20 %stenosed.  Mid RCA lesion, 20 %stenosed.  Mid RCA to Dist RCA lesion, 20 %stenosed.  Dist RCA lesion, 30 %stenosed.  Prox Cx lesion, 20 %stenosed.  3rd Mrg lesion, 20 %stenosed.  2nd Mrg lesion, 20 %stenosed.  Ost 1st Diag to 1st Diag lesion, 20 %stenosed.  Ost 2nd Diag to 2nd Diag lesion, 20 %stenosed.  Dist LAD lesion, 20 %stenosed.  A STENT PROMUS PREM MR 3.5X24 drug eluting stent was successfully placed.  Prox LAD to Mid LAD lesion, 99 %stenosed.  Post intervention, there is a 0% residual stenosis.   1. Acute anterior STEMI secondary to sub-totally occluded mid LAD 2. Successful PTCA/DES x 1 mid LAD 3. Patent stent in the distal RCA 4. Non-obstructive disease noted in the RCA and Circumflex  Recommendations: Will continue ASA, Brilinta for one year. Will start statin and beta blocker. Admit to ICU. Echo later today or in am. Hydration for next 12 hours. Repeat BMET in am.   ASSESSMENT AND PLAN:  Active Problems:   Hyperlipidemia   Hypertension   Hypertensive chronic kidney disease   Acute ST elevation myocardial infarction (STEMI) involving left anterior descending (LAD) coronary artery ([place])   # STEMI s/p LAD PCI:  # Hyperlipidemia: Mr. Freudenthal has known CAD with prior RCA PCI and presented with anterior STEMI.  LVEF reduced to 30-35%.  No thrombus was noted on  echo.  He has not had any  ectopy on telemetry. Therefore, we will not place a LifeVest at discharge.  Continue metoprolol, reduce the dose to 12.5 mg bid.  We will also start his home ramipril at a reduced dose of 10 mg daily instead of twice a day.  Continue aspirin 81mg  daily and prasugrel 10 mg daily.  He will need DAPT for at least one year.  He has not tolerated atorvastatin or rosuvastatin in the past. He also had myalgias with another statin that he does not murmur the name. He is not willing to retry one.  LDL is not well-controlled. He needs to be seen in lipid clinic as an outpatient. He would be a good candidate for Re and is interested in trying one of these medications. patha or Praluent   # Acute systolic and diastolic heart failure: # Severe MR: Mr. Spillman is currently asymptomatic. Continue torsemide. He appears to be euvolemic at this time and his renal function is stable. Hopefully, with improvement in his LVEF the mitral regurgitation will improve. If not, he may need consideration of mitral valve repair/replacement in the future.    # Hypertension: Blood pressure is currently well-controlled. However, he needs to be restarted on his ACE inhibitor for nephrology. This will help reduce his proteinuria and also help with LV remodeling. We will restart ramipril 10 mg daily instead of twice a day. Metoprolol will be reduced to 12.5 mg twice a day. He has a follow up and will have a BMP checked with Dr. Jimmy Footman in one week.  # CKD IV: Appreciate nephrology assistance during his hospitalization. We will restart ramipril as above. Creatinine is stable.  Dispo: Home today.  Follow up with Dr. Fletcher Anon.     Bonny Vanleeuwen C. Oval Linsey, MD, [facility] 06/11/2016 10:44 AM

## 2016-06-11 NOTE — Progress Notes (Signed)
Subjective: Interval History: has no complaint , no CP.  Objective: Vital signs in last 24 hours: Temp:  [97.8 F (36.6 C)-99.1 F (37.3 C)] 98.3 F (36.8 C) (07/29 0500) Pulse Rate:  [63-92] 63 (07/29 0500) Resp:  [10-24] 16 (07/29 0500) BP: (104-146)/(66-101) 104/74 (07/29 0500) SpO2:  [90 %-100 %] 97 % (07/29 0500) Weight:  [108.6 kg (239 lb 8 oz)] 108.6 kg (239 lb 8 oz) (07/29 0500) Weight change: -3.856 kg (-8 lb 8 oz)  Intake/Output from previous day: 07/28 0701 - 07/29 0700 In: 486 [P.O.:480; I.V.:6] Out: 325 [Urine:325] Intake/Output this shift: No intake/output data recorded.  General appearance: alert, cooperative and no distress Resp: clear to auscultation bilaterally Cardio: S1, S2 normal and systolic murmur: holosystolic 2/6, blowing at apex GI: soft, non-tender; bowel sounds normal; no masses,  no organomegaly Extremities: venous stasis dermatitis noted  Lab Results:  Recent Labs  06/10/16 0327 06/11/16 0326  WBC 12.5* 12.0*  HGB 17.6* 17.8*  HCT 52.7* 55.1*  PLT 167 181   BMET:  Recent Labs  06/10/16 0327 06/11/16 0326  NA 139 136  K 5.3* 4.6  CL 109 106  CO2 20* 23  GLUCOSE 85 86  BUN 54* 52*  CREATININE 3.35* 3.66*  CALCIUM 9.0 9.1   No results for input(s): PTH in the last 72 hours. Iron Studies: No results for input(s): IRON, TIBC, TRANSFERRIN, FERRITIN in the last 72 hours.  Studies/Results: US Renal  Result Date: 06/10/2016 CLINICAL DATA:  Stage 4 kidney disease. EXAM: RENAL / URINARY TRACT ULTRASOUND COMPLETE COMPARISON:  None. FINDINGS: Right Kidney: Length: 10.0 cm, within normal limits. The parenchyma is slightly hyperechoic to the index organ, the liver. No focal lesions are present. There is no stone or hydronephrosis. Left Kidney: Length: 11.3 cm, within normal limits. The parenchyma is isoechoic to the index organ, the spleen. No focal lesions are present. Minimal pelvic dilation versus a more likely extra renal pelvis is noted.  No focal mass lesion is present. There is no stone. Bladder: The left ureteral jet is visualized. The right ureteral jet is not visualized. The bladder is otherwise unremarkable. IMPRESSION: 1. The kidneys are hyperechoic bilaterally. This is nonspecific, but commonly seen in the setting of medical renal disease. 2. Slight left collecting system dilation versus more likely an extra renal pelvis. 3. No stone or mass lesion and either kidney. Electronically Signed   By: San Morelle M.D.   On: 06/10/2016 20:16   I have reviewed the patient's current medications.  Assessment/Plan: 1  CKD 4 U/S cw CKD, Cr stable at this point and need to check mid week next week. Vol ok, PTH pending.   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  He is to give blood, need to get lower 3 CAD per cards 4 HPTH 5 HTN controlled needs back on Ramipril to suppress proteinuria 6 depression 7 hypothyroid 8 ^ lipids refusing statin P check chem next week, f/u PTH, restart ramipril at d/c    LOS: 2 days   Dylan Chandler L 06/11/2016,7:48 AM

## 2016-06-11 NOTE — Progress Notes (Signed)
Discharge instructions reviewed with patient. V/S stable. IV D/C. Will cont to monitor pt.

## 2016-06-11 NOTE — Progress Notes (Signed)
Prescription for nitro given to patient that was filled out by the PA on call. Patient discharged home with sister.

## 2016-06-11 NOTE — Discharge Summary (Signed)
Discharge Summary    Patient ID: Dylan Chandler,  MRN: [id number], DOB/AGE: [birth year] 50 y.o.  Admit date: 06/09/2016 Discharge date: 06/11/2016  Primary Care Provider: Golden Pop Primary Cardiologist: Standley Brooking, Harrell Gave MD  Discharge Diagnoses    Active Problems:   Hyperlipidemia   Hypertension   Hypertensive chronic kidney disease   Acute ST elevation myocardial infarction (STEMI) involving left anterior descending (LAD) coronary artery (HCC)   Allergies Allergies  Allergen Reactions  . Amlodipine Swelling    Diagnostic Studies/Procedures   1. Cardiac Cath 06/09/2016 Conclusion     Prox RCA lesion, 20 %stenosed.  Mid RCA lesion, 20 %stenosed.  Mid RCA to Dist RCA lesion, 20 %stenosed.  Dist RCA lesion, 30 %stenosed.  Prox Cx lesion, 20 %stenosed.  3rd Mrg lesion, 20 %stenosed.  2nd Mrg lesion, 20 %stenosed.  Ost 1st Diag to 1st Diag lesion, 20 %stenosed.  Ost 2nd Diag to 2nd Diag lesion, 20 %stenosed.  Dist LAD lesion, 20 %stenosed.  A STENT PROMUS PREM MR 3.5X24 drug eluting stent was successfully placed.  Prox LAD to Mid LAD lesion, 99 %stenosed.  Post intervention, there is a 0% residual stenosis.   1. Acute anterior STEMI secondary to sub-totally occluded mid LAD 2. Successful PTCA/DES x 1 mid LAD 3. Patent stent in the distal RCA 4. Non-obstructive disease noted in the RCA and Circumflex    2. Echocardiogram 06/10/2016 Left ventricle: The cavity size was moderately dilated. There was   mild concentric hypertrophy. Systolic function was moderately to   severely reduced. The estimated ejection fraction was in the   range of 30% to 35%. Dyskinesis of the apical myocardium.   Hypokinesis of the mid-apicalanteroseptal and anterior   myocardium. Features are consistent with a pseudonormal left   ventricular filling pattern, with concomitant abnormal relaxation   and increased filling pressure (grade 2 diastolic dysfunction).   No evidence of thrombus. - Mitral valve: Calcified annulus. There was severe regurgitation   directed centrally, eccentrically, and toward the septum. The   acceleration rate of the regurgitant jet was reduced, consistent   with a low dP/dt. Severe regurgitation is suggested by pulmonary   vein systolic flow reversal. - Left atrium: The atrium was moderately dilated.  _____________   History of Present Illness    Mr. Dylan Chandler is a 50 year old male patient with known history of coronary artery disease, stenting of the right coronary artery in 2007 at [facility], other history includes hypertension, hyperlipidemia, chronic kidney disease, and obstructive sleep apnea. He presented to [facility] emergency room with complaints of recurrent chest pain, with abnormal EKG with IVCD and ST changes anteriorly. A code STEMI was activated by the ED staff and the patient was taken emergently to the cardiac Cath Lab with admission for ACS.Marland Kitchen   Hospital Course     Consultants: Nephrology  The patient was brought to cath lab and had cardiac catheterization by Dr. Angelena Form on 06/09/2016. Please see details of catheterization above. He was found to have a subtotally occluded mid LAD. He had successful PTCA/DES 1 to the mid LAD, he was found to have a patent stent in the distal RCA, with nonobstructive disease noted in the RCA and circumflex. He was placed on aspirin, and Effient for one year.  Due to elevation in creatinine of 3.35, and 3.66, nephrology consultation was recommended. He was seen by Dr. Jimmy Footman it was felt that his elevated creatinine was related to acute kidney injury in the setting of heavy  NSAID use and said nephrotic range proteinuria. He felt that the patient would benefit from IV fluids. It was possible that he also had contrast dye nephropathy. A renal ultrasound was completed. This revealed that the kidneys were hyperechoic bilaterally. This was found to be nonspecifically that seen in  the setting of medical renal disease. Was found to have slight left collecting system dilatation versus more likely in a sugar renal pelvis. There were no stones or masses identified in either kidney. He was seen by nephrology on day of discharge. It was felt that his creatinine was stable and will need to follow-up with a BMET in approximately 4 days. Nephrology will manage kidney disease. He was recommended to be placed back on ramipril to suppress proteinuria.  He was seen and examined by Dr. Oval Linsey, on day of discharge. LVEF per echocardiogram revealed 30-35%. Because the patient did not have any ectopy on telemetry, a LifeVest was not placed on discharge. He was continued on reduced dose of metoprolol 12.5 mg twice a day, also lower dose of beta r abdomen well on EKG he would be negative for jugularamapril at 10 mg daily. He will be continued on dual antiplatelet therapy for a minimum of 1 year. The patient is not tolerant of statins causing severe myalgias. He will be considered for Repatha. This can be discussed on follow-up appointment    The patient recovered from acute systolic and diastolic heart failure in the setting of STEMI. He was continued on torsemide and appeared euvolemic at the time of his predischarge assessment. Blood pressure was stable. He was found to be ready for discharge. Follow-up appointments will be made with Dr. Oval Linsey our team providers  posthospitalization.    _____________  Discharge Vitals Blood pressure 132/86, pulse 63, temperature 98.2 F (36.8 C), temperature source Oral, resp. rate 18, height 5\' 9"  (1.753 m), weight 239 lb 8 oz (108.6 kg), SpO2 96 %.  Filed Weights   06/09/16 0926 06/11/16 0500  Weight: 248 lb (112.5 kg) 239 lb 8 oz (108.6 kg)    Labs & Radiologic Studies     CBC  Recent Labs  06/09/16 1107 06/10/16 0327 06/11/16 0326  WBC 8.6 12.5* 12.0*  NEUTROABS 6.8  --  8.0*  HGB 17.0 17.6* 17.8*  HCT 50.1 52.7* 55.1*  MCV 89.8 89.8  91.2  PLT 148* 167 0000000   Basic Metabolic Panel  Recent Labs  06/10/16 0327 06/11/16 0326  NA 139 136  K 5.3* 4.6  CL 109 106  CO2 20* 23  GLUCOSE 85 86  BUN 54* 52*  CREATININE 3.35* 3.66*  CALCIUM 9.0 9.1  PHOS  --  3.5   Liver Function Tests  Recent Labs  06/09/16 1107 06/11/16 0326  AST 48*  --   ALT 33  --   ALKPHOS 54  --   BILITOT 0.7  --   PROT 6.3*  --   ALBUMIN 3.6 3.6   No results for input(s): LIPASE, AMYLASE in the last 72 hours. Cardiac Enzymes  Recent Labs  06/09/16 1107 06/09/16 1728 06/09/16 2251  TROPONINI 1.51*  1.40* 62.69* 63.61*   BNP Invalid input(s): POCBNP D-Dimer No results for input(s): DDIMER in the last 72 hours. Hemoglobin A1C  Recent Labs  06/09/16 1107  HGBA1C 5.4   Fasting Lipid Panel  Recent Labs  06/09/16 1107  CHOL 198  HDL 19*  LDLCALC 153*  TRIG 130  CHOLHDL 10.4   Thyroid Function Tests No results for input(s): TSH,  T4TOTAL, T3FREE, THYROIDAB in the last 72 hours.  Invalid input(s): FREET3  US Renal  Result Date: 06/10/2016 CLINICAL DATA:  Stage 4 kidney disease. EXAM: RENAL / URINARY TRACT ULTRASOUND COMPLETE COMPARISON:  None. FINDINGS: Right Kidney: Length: 10.0 cm, within normal limits. The parenchyma is slightly hyperechoic to the index organ, the liver. No focal lesions are present. There is no stone or hydronephrosis. Left Kidney: Length: 11.3 cm, within normal limits. The parenchyma is isoechoic to the index organ, the spleen. No focal lesions are present. Minimal pelvic dilation versus a more likely extra renal pelvis is noted. No focal mass lesion is present. There is no stone. Bladder: The left ureteral jet is visualized. The right ureteral jet is not visualized. The bladder is otherwise unremarkable. IMPRESSION: 1. The kidneys are hyperechoic bilaterally. This is nonspecific, but commonly seen in the setting of medical renal disease. 2. Slight left collecting system dilation versus more likely an  extra renal pelvis. 3. No stone or mass lesion and either kidney. Electronically Signed   By: San Morelle M.D.   On: 06/10/2016 20:16   Disposition   Pt is being discharged home today in good condition.  Follow-up Plans & Appointments     Discharge Instructions    Amb Referral to Cardiac Rehabilitation    Complete by:  As directed   Diagnosis:   STEMI Coronary Stents        Discharge Medications   Current Discharge Medication List    CONTINUE these medications which have NOT CHANGED   Details  ALPRAZolam (XANAX) 1 MG tablet Take 1 tablet (1 mg total) by mouth 2 (two) times daily as needed. Qty: 60 tablet, Refills: 5   Associated Diagnoses: Depression    aspirin EC 325 MG tablet Take 325 mg by mouth daily.    liothyronine (CYTOMEL) 25 MCG tablet Take 3 tablets (75 mcg total) by mouth daily. Qty: 90 tablet, Refills: 12   Associated Diagnoses: Hypogonadotropic hypogonadism 1 (HCC)    nefazodone (SERZONE) 150 MG tablet Take 1 tablet (150 mg total) by mouth 2 (two) times daily. Qty: 60 tablet, Refills: 12   Associated Diagnoses: Depression    ramipril (ALTACE) 10 MG capsule TAKE 2 CAPSULES BY MOUTH DAILY. Qty: 60 capsule, Refills: 12   Associated Diagnoses: Essential hypertension    sertraline (ZOLOFT) 100 MG tablet Take 2 tablets (200 mg total) by mouth 2 (two) times daily. Qty: 120 tablet, Refills: 12   Associated Diagnoses: Depression    testosterone cypionate (DEPOTESTOSTERONE CYPIONATE) 200 MG/ML injection Inject 0.5 mLs (100 mg total) into the muscle once a week. Qty: 10 mL, Refills: 3   Associated Diagnoses: Hypogonadotropic hypogonadism 1 (HCC)    torsemide (DEMADEX) 20 MG tablet Take 1 tablet (20 mg total) by mouth daily. Qty: 30 tablet, Refills: 12   Associated Diagnoses: Essential hypertension         Aspirin prescribed at discharge? Yes  High Intensity Statin Prescribed? No statin intolerant  Beta Blocker Prescribed? Yes For EF 45% or less,  Was ACEI/ARB Prescribed? Yes ADP Receptor Inhibitor Prescribed? Yes For EF <40%, Aldosterone Inhibitor Prescribed? No AKI Was EF assessed during THIS hospitalization? Y Was Cardiac Rehab II ordered? Yes  Outstanding Labs/Studies   BMET in 4 days.   Duration of Discharge Encounter   Greater than 30 minutes including physician time.  Signed, Jory Sims NP 06/11/2016, 12:28 PM

## 2016-06-12 LAB — PARATHYROID HORMONE, INTACT (NO CA): PTH: 78 pg/mL — ABNORMAL HIGH (ref 15–65)

## 2016-06-13 LAB — HEMOGLOBIN A1C
Hgb A1c MFr Bld: 5.3 % (ref 4.8–5.6)
Mean Plasma Glucose: 105 mg/dL

## 2016-06-24 ENCOUNTER — Telehealth: Payer: Self-pay | Admitting: Cardiovascular Disease

## 2016-06-25 NOTE — Progress Notes (Deleted)
Cardiology Office Note   Date:  06/25/2016   ID:  Dylan Chandler, Dylan Chandler [birth year], MRN [id number]  PCP:  Golden Pop, MD  Cardiologist:   Skeet Latch, MD   No chief complaint on file.     History of Present Illness: Dylan Chandler is a 50 y.o. male with CAD s/p RCA PCI, hypertension, hyperlipidemia, CKD IV and OSA who presents for follow up after hospitalization for STEMI.  Dylan Chandler was admitted with a STEMI 05/2016 and was found to have a 99% proximal LAD lesion that was successfully stented with a Promus DES.  His echo revealed LVEF 30-35% with anteroseptal, anterior and apical wall motion abnormalities.  Dylan Chandler has not tolerated statins in the past.    Past Medical History:  Diagnosis Date  . CAD (coronary artery disease)   . Cardiac murmur   . Chronic kidney disease, stage III (moderate)   . Depression   . Elevated SGOT   . Hyperlipidemia   . Hypertension   . Hypogonadotropic hypogonadism 1 ([place])   . Sleep apnea   . Thyroid disease     Past Surgical History:  Procedure Laterality Date  . CARDIAC CATHETERIZATION N/A 06/09/2016   Procedure: Left Heart Cath and Coronary Angiography;  Surgeon: Burnell Blanks, MD;  Location: [facility];  Service: Cardiovascular;  Laterality: N/A;  . CARDIAC CATHETERIZATION N/A 06/09/2016   Procedure: Coronary Stent Intervention;  Surgeon: Burnell Blanks, MD;  Location: [facility];  Service: Cardiovascular;  Laterality: N/A;  . LASER ABLATION Right    leg  . phlebectomy Right    leg  . SHOULDER SURGERY       Current Outpatient Prescriptions  Medication Sig Dispense Refill  . ALPRAZolam (XANAX) 1 MG tablet Take 1 tablet (1 mg total) by mouth 2 (two) times daily as needed. 60 tablet 5  . aspirin EC 325 MG tablet Take 325 mg by mouth daily.    Marland Kitchen liothyronine (CYTOMEL) 25 MCG tablet Take 3 tablets (75 mcg total) by mouth daily. 90 tablet 12  . metoprolol tartrate (LOPRESSOR) 25 MG  tablet Take 0.5 tablets (12.5 mg total) by mouth 2 (two) times daily. 60 tablet 10  . nefazodone (SERZONE) 150 MG tablet Take 1 tablet (150 mg total) by mouth 2 (two) times daily. 60 tablet 12  . prasugrel (EFFIENT) 10 MG TABS tablet Take 1 tablet (10 mg total) by mouth daily. 30 tablet 11  . ramipril (ALTACE) 10 MG capsule Take 1 capsule (10 mg total) by mouth daily. 30 capsule 10  . sertraline (ZOLOFT) 100 MG tablet Take 2 tablets (200 mg total) by mouth 2 (two) times daily. 120 tablet 12  . testosterone cypionate (DEPOTESTOSTERONE CYPIONATE) 200 MG/ML injection Inject 0.5 mLs (100 mg total) into the muscle once a week. 10 mL 3  . torsemide (DEMADEX) 20 MG tablet Take 1 tablet (20 mg total) by mouth daily. 30 tablet 12   Current Facility-Administered Medications  Medication Dose Route Frequency Provider Last Rate Last Dose  . nitroGLYCERIN (NITROSTAT) SL tablet 0.4 mg  0.4 mg Sublingual Q5 min PRN Dylan Colonel, NP        Allergies:   Amlodipine    Social History:  The patient  reports that he has never smoked. He has never used smokeless tobacco. He reports that he does not drink alcohol or use drugs.   Family History:  The patient's ***family history includes AAA (abdominal aortic aneurysm) in  his father; Cancer in his father and mother; Hyperlipidemia in his father and mother.    ROS:  Please see the history of present illness.   Otherwise, review of systems are positive for {NONE DEFAULTED:18576::"none"}.   All other systems are reviewed and negative.    PHYSICAL EXAM: VS:  There were no vitals taken for this visit. , BMI There is no height or weight on file to calculate BMI. GENERAL:  Well appearing HEENT:  Pupils equal round and reactive, fundi not visualized, oral mucosa unremarkable NECK:  No jugular venous distention, waveform within normal limits, carotid upstroke brisk and symmetric, no bruits, no thyromegaly LYMPHATICS:  No cervical adenopathy LUNGS:  Clear to  auscultation bilaterally HEART:  RRR.  PMI not displaced or sustained,S1 and S2 within normal limits, no S3, no S4, no clicks, no rubs, *** murmurs ABD:  Flat, positive bowel sounds normal in frequency in pitch, no bruits, no rebound, no guarding, no midline pulsatile mass, no hepatomegaly, no splenomegaly EXT:  2 plus pulses throughout, no edema, no cyanosis no clubbing SKIN:  No rashes no nodules NEURO:  Cranial nerves II through XII grossly intact, motor grossly intact throughout PSYCH:  Cognitively intact, oriented to person place and time    EKG:  EKG {ACTION; IS/IS [id number] ordered today. The ekg ordered today demonstrates ***  LHC 06/09/16:  Prox RCA lesion, 20 %stenosed.  Mid RCA lesion, 20 %stenosed.  Mid RCA to Dist RCA lesion, 20 %stenosed.  Dist RCA lesion, 30 %stenosed.  Prox Cx lesion, 20 %stenosed.  3rd Mrg lesion, 20 %stenosed.  2nd Mrg lesion, 20 %stenosed.  Ost 1st Diag to 1st Diag lesion, 20 %stenosed.  Ost 2nd Diag to 2nd Diag lesion, 20 %stenosed.  Dist LAD lesion, 20 %stenosed.  A STENT PROMUS PREM MR 3.5X24 drug eluting stent was successfully placed.  Prox LAD to Mid LAD lesion, 99 %stenosed.  Post intervention, there is a 0% residual stenosis.   1. Acute anterior STEMI secondary to sub-totally occluded mid LAD 2. Successful PTCA/DES x 1 mid LAD 3. Patent stent in the distal RCA 4. Non-obstructive disease noted in the RCA and Circumflex  Echo 06/10/16: Study Conclusions  - Left ventricle: The cavity size was moderately dilated. There was   mild concentric hypertrophy. Systolic function was moderately to   severely reduced. The estimated ejection fraction was in the   range of 30% to 35%. Dyskinesis of the apical myocardium.   Hypokinesis of the mid-apicalanteroseptal and anterior   myocardium. Features are consistent with a pseudonormal left   ventricular filling pattern, with concomitant abnormal relaxation   and increased filling  pressure (grade 2 diastolic dysfunction).   No evidence of thrombus. - Mitral valve: Calcified annulus. There was severe regurgitation   directed centrally, eccentrically, and toward the septum. The   acceleration rate of the regurgitant jet was reduced, consistent   with a low dP/dt. Severe regurgitation is suggested by pulmonary   vein systolic flow reversal. - Left atrium: The atrium was moderately dilated.   Recent Labs: 07/15/2015: TSH 0.537 06/09/2016: ALT 33 06/11/2016: BUN 52; Creatinine, Ser 3.66; Hemoglobin 17.8; Platelets 181; Potassium 4.6; Sodium 136    Lipid Panel    Component Value Date/Time   CHOL 198 06/09/2016 1107   CHOL 216 (H) 04/06/2016 1359   TRIG 130 06/09/2016 1107   HDL 19 (L) 06/09/2016 1107   HDL 25 (L) 04/06/2016 1359   CHOLHDL 10.4 06/09/2016 1107   VLDL 26 06/09/2016 1107  LDLCALC 153 (H) 06/09/2016 1107   LDLCALC 134 (H) 04/06/2016 1359      Wt Readings from Last 3 Encounters:  04/06/16 243 lb (110.2 kg)  10/13/15 248 lb (112.5 kg)  07/13/15 244 lb (110.7 kg)      ASSESSMENT AND PLAN:  ***   Current medicines are reviewed at length with the patient today.  The patient {ACTIONS; HAS/DOES NOT HAVE:19233} concerns regarding medicines.  The following changes have been made:  {PLAN; NO CHANGE:13088:s}  Labs/ tests ordered today include: *** No orders of the defined types were placed in this encounter.    Disposition:   FU with ***    This note was written with the assistance of speech recognition software.  Please excuse any transcriptional errors.  Signed, Trustin Chapa C. Oval Linsey, MD, [facility]  06/25/2016 11:18 PM    [facility]

## 2016-06-27 ENCOUNTER — Ambulatory Visit: Payer: BLUE CROSS/BLUE SHIELD | Admitting: Cardiovascular Disease

## 2016-06-28 ENCOUNTER — Encounter: Payer: Self-pay | Admitting: *Deleted

## 2016-07-11 NOTE — Telephone Encounter (Signed)
Closed encounter °

## 2016-08-17 ENCOUNTER — Other Ambulatory Visit: Payer: Self-pay

## 2016-08-17 MED ORDER — SERTRALINE HCL 100 MG PO TABS: 200.0000 mg | ORAL_TABLET | Freq: Two times a day (BID) | ORAL | 1 refills | Status: DC

## 2016-08-17 MED ORDER — NEFAZODONE HCL 150 MG PO TABS: 150.0000 mg | ORAL_TABLET | Freq: Two times a day (BID) | ORAL | 1 refills | Status: DC

## 2016-08-17 NOTE — Telephone Encounter (Signed)
Request for 90 day supply of Nefazodone 150mg  and Sertraline 100mg .  Upcoming appointment 10/12/2016

## 2016-08-18 ENCOUNTER — Other Ambulatory Visit: Payer: Self-pay | Admitting: Family Medicine

## 2016-08-18 MED ORDER — NEFAZODONE HCL 150 MG PO TABS: 150.0000 mg | ORAL_TABLET | Freq: Two times a day (BID) | ORAL | 1 refills | Status: DC

## 2016-10-11 ENCOUNTER — Other Ambulatory Visit: Payer: Self-pay | Admitting: Family Medicine

## 2016-10-11 DIAGNOSIS — F329 Major depressive disorder, single episode, unspecified: Secondary | ICD-10-CM

## 2016-10-11 DIAGNOSIS — F32A Depression, unspecified: Secondary | ICD-10-CM

## 2016-10-11 MED ORDER — ALPRAZOLAM 1 MG PO TABS: 1.0000 mg | ORAL_TABLET | Freq: Two times a day (BID) | ORAL | 5 refills | Status: DC | PRN

## 2016-10-11 NOTE — Telephone Encounter (Signed)
Request for Alprazolam 1mg  tablet.

## 2016-10-12 ENCOUNTER — Ambulatory Visit: Payer: BLUE CROSS/BLUE SHIELD | Admitting: Family Medicine

## 2016-10-26 ENCOUNTER — Encounter: Payer: Self-pay | Admitting: Family Medicine

## 2016-10-26 ENCOUNTER — Ambulatory Visit (INDEPENDENT_AMBULATORY_CARE_PROVIDER_SITE_OTHER): Payer: BLUE CROSS/BLUE SHIELD | Admitting: Family Medicine

## 2016-10-26 VITALS — BP 125/73 | HR 92 | Temp 97.9°F | Ht 68.0 in | Wt 245.0 lb

## 2016-10-26 DIAGNOSIS — E7800 Pure hypercholesterolemia, unspecified: Secondary | ICD-10-CM

## 2016-10-26 DIAGNOSIS — E079 Disorder of thyroid, unspecified: Secondary | ICD-10-CM | POA: Diagnosis not present

## 2016-10-26 DIAGNOSIS — F330 Major depressive disorder, recurrent, mild: Secondary | ICD-10-CM

## 2016-10-26 DIAGNOSIS — E291 Testicular hypofunction: Secondary | ICD-10-CM

## 2016-10-26 DIAGNOSIS — E230 Hypopituitarism: Secondary | ICD-10-CM | POA: Diagnosis not present

## 2016-10-26 DIAGNOSIS — I129 Hypertensive chronic kidney disease with stage 1 through stage 4 chronic kidney disease, or unspecified chronic kidney disease: Secondary | ICD-10-CM | POA: Diagnosis not present

## 2016-10-26 DIAGNOSIS — I2102 ST elevation (STEMI) myocardial infarction involving left anterior descending coronary artery: Secondary | ICD-10-CM

## 2016-10-26 DIAGNOSIS — I10 Essential (primary) hypertension: Secondary | ICD-10-CM

## 2016-10-26 MED ORDER — TESTOSTERONE CYPIONATE 200 MG/ML IM SOLN: 100.0000 mg | INTRAMUSCULAR | 3 refills | Status: DC

## 2016-10-26 MED ORDER — ROSUVASTATIN CALCIUM 5 MG PO TABS: 5.0000 mg | ORAL_TABLET | Freq: Every day | ORAL | 3 refills | Status: DC

## 2016-10-26 NOTE — Assessment & Plan Note (Signed)
The current medical regimen is effective;  continue present plan and medications.  

## 2016-10-26 NOTE — Assessment & Plan Note (Signed)
Discussed cholesterol because of past experience and wearing [place] of cholesterol medicine will start with very low dose and slowly increase. We'll start with lowest dose Crestor 5 mg and start every other day we will try to increase to higher dosage over time

## 2016-10-26 NOTE — Assessment & Plan Note (Signed)
Discussed patient doing well no further cardiac symptoms reviewed hypercholesterol and need for medications

## 2016-10-26 NOTE — Progress Notes (Signed)
BP 125/73 (BP Location: Left Arm, Patient Position: Sitting, Cuff Size: Normal)   Pulse 92   Temp 97.9 F (36.6 C)   Ht 5\' 8"  (1.727 m)   Wt 245 lb (111.1 kg)   SpO2 99%   BMI 37.25 kg/m    Subjective:    Patient ID: Dylan Chandler, male    DOB: Mar 19, 1966, 50 y.o.   MRN: [id number]  HPI: Dylan Chandler is a 50 y.o. male  Chief Complaint  Patient presents with  . Hypogonadism  . Hypertension  Patient follow-up coronary artery disease reviewed notes of nephrology and cardiology from this summer. Patient's renal function is stable following with nephrology due to confusion hasn't been back with cardiology yet. Patient not on cholesterol medicine yet. Depression stable on current medications anxiety controlled. Testosterone shot 5 days ago and is doing well.  Relevant past medical, surgical, family and social history reviewed and updated as indicated. Interim medical history since our last visit reviewed. Allergies and medications reviewed and updated.  Review of Systems  Constitutional: Negative.   Respiratory: Negative.   Cardiovascular: Negative.     Per HPI unless specifically indicated above     Objective:    BP 125/73 (BP Location: Left Arm, Patient Position: Sitting, Cuff Size: Normal)   Pulse 92   Temp 97.9 F (36.6 C)   Ht 5\' 8"  (1.727 m)   Wt 245 lb (111.1 kg)   SpO2 99%   BMI 37.25 kg/m   Wt Readings from Last 3 Encounters:  10/26/16 245 lb (111.1 kg)  04/06/16 243 lb (110.2 kg)  10/13/15 248 lb (112.5 kg)    Physical Exam  Constitutional: He is oriented to person, place, and time. He appears well-developed and well-nourished. No distress.  HENT:  Head: Normocephalic and atraumatic.  Right Ear: Hearing normal.  Left Ear: Hearing normal.  Nose: Nose normal.  Eyes: Conjunctivae and lids are normal. Right eye exhibits no discharge. Left eye exhibits no discharge. No scleral icterus.  Cardiovascular: Normal rate, regular rhythm and normal heart  sounds.   Pulmonary/Chest: Effort normal and breath sounds normal. No respiratory distress.  Musculoskeletal: Normal range of motion.  Neurological: He is alert and oriented to person, place, and time.  Skin: Skin is intact. No rash noted.  Psychiatric: He has a normal mood and affect. His speech is normal and behavior is normal. Judgment and thought content normal. Cognition and memory are normal.    Results for orders placed or performed in visit on 04/06/16  Microscopic Examination  Result Value Ref Range   WBC, UA None seen 0 - 5 /hpf   RBC, UA 0-2 0 - 2 /hpf   Epithelial Cells (non renal) None seen 0 - 10 /hpf   Bacteria, UA Few None seen/Few  CBC with Differential/Platelet  Result Value Ref Range   WBC 9.5 3.4 - 10.8 x10E3/uL   RBC 5.91 (H) 4.14 - 5.80 x10E6/uL   Hemoglobin 17.7 12.6 - 17.7 g/dL   Hematocrit 52.2 (H) 37.5 - 51.0 %   MCV 88 79 - 97 fL   MCH 29.9 26.6 - 33.0 pg   MCHC 33.9 31.5 - 35.7 g/dL   RDW 14.1 12.3 - 15.4 %   Platelets 190 150 - 379 x10E3/uL   Neutrophils 62 %   Lymphs 27 %   Monocytes 9 %   Eos 2 %   Basos 0 %   Neutrophils Absolute 5.9 1.4 - 7.0 x10E3/uL   Lymphocytes Absolute  2.6 0.7 - 3.1 x10E3/uL   Monocytes Absolute 0.8 0.1 - 0.9 x10E3/uL   EOS (ABSOLUTE) 0.2 0.0 - 0.4 x10E3/uL   Basophils Absolute 0.0 0.0 - 0.2 x10E3/uL   Immature Granulocytes 0 %   Immature Grans (Abs) 0.0 0.0 - 0.1 x10E3/uL  Comprehensive metabolic panel  Result Value Ref Range   Glucose 67 65 - 99 mg/dL   BUN 82 (HH) 6 - 24 mg/dL   Creatinine, Ser 3.61 (H) 0.76 - 1.27 mg/dL   GFR calc non Af Amer 19 (L) >59 mL/min/1.73   GFR calc Af Amer 22 (L) >59 mL/min/1.73   BUN/Creatinine Ratio 23 (H) 9 - 20   Sodium 141 134 - 144 mmol/L   Potassium 5.3 (H) 3.5 - 5.2 mmol/L   Chloride 101 96 - 106 mmol/L   CO2 20 18 - 29 mmol/L   Calcium 9.3 8.7 - 10.2 mg/dL   Total Protein 6.9 6.0 - 8.5 g/dL   Albumin 4.4 3.5 - 5.5 g/dL   Globulin, Total 2.5 1.5 - 4.5 g/dL    Albumin/Globulin Ratio 1.8 1.2 - 2.2   Bilirubin Total 0.3 0.0 - 1.2 mg/dL   Alkaline Phosphatase 71 39 - 117 IU/L   AST 67 (H) 0 - 40 IU/L   ALT 62 (H) 0 - 44 IU/L  Lipid Panel w/o Chol/HDL Ratio  Result Value Ref Range   Cholesterol, Total 216 (H) 100 - 199 mg/dL   Triglycerides 285 (H) 0 - 149 mg/dL   HDL 25 (L) >39 mg/dL   VLDL Cholesterol Cal 57 (H) 5 - 40 mg/dL   LDL Calculated 134 (H) 0 - 99 mg/dL  PSA  Result Value Ref Range   Prostate Specific Ag, Serum 0.7 0.0 - 4.0 ng/mL  Urinalysis, Routine w reflex microscopic (not at [facility])  Result Value Ref Range   Specific Gravity, UA <1.005 (L) 1.005 - 1.030   pH, UA 5.0 5.0 - 7.5   Color, UA Yellow Yellow   Appearance Ur Clear Clear   Leukocytes, UA Negative Negative   Protein, UA 2+ (A) Negative/Trace   Glucose, UA Negative Negative   Ketones, UA Negative Negative   RBC, UA Trace (A) Negative   Bilirubin, UA Negative Negative   Urobilinogen, Ur 0.2 0.2 - 1.0 mg/dL   Nitrite, UA Negative Negative   Microscopic Examination See below:   T3, free  Result Value Ref Range   T3, Free 4.5 (H) 2.0 - 4.4 pg/mL      Assessment & Plan:   Problem List Items Addressed This Visit      Cardiovascular and Mediastinum   Hypertension    The current medical regimen is effective;  continue present plan and medications.       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        Relevant Medications   testosterone cypionate (DEPOTESTOSTERONE  CYPIONATE) 200 MG/ML injection     Genitourinary   Hypertensive chronic kidney disease    The current medical regimen is effective;  continue present plan and medications.         Other   Hyperlipidemia    Discussed cholesterol because of past experience and wearing [place] of cholesterol medicine will start with very low dose and slowly increase. We'll start with lowest dose Crestor 5 mg and start every other day we will try to increase to higher dosage over time       Relevant Medications   rosuvastatin (CRESTOR) 5 MG tablet   Other Relevant Orders   Lipid panel   ALT   AST   Depression    The current medical regimen is effective;  continue present plan and medications.        Other Visit Diagnoses    Hypogonadism male    -  Primary   Relevant Orders   Testosterone   CBC with Differential/Platelet   PSA   Basic Metabolic Panel (BMET)       Follow up plan: Return in about 2 months (around 12/27/2016) for BMP,  Lipids, ALT, AST.

## 2016-10-27 LAB — CBC WITH DIFFERENTIAL/PLATELET
BASOS: 0 %
Basophils Absolute: 0 10*3/uL (ref 0.0–0.2)
EOS (ABSOLUTE): 0.2 10*3/uL (ref 0.0–0.4)
EOS: 2 %
HEMOGLOBIN: 16.9 g/dL (ref 13.0–17.7)
Hematocrit: 52.3 % — ABNORMAL HIGH (ref 37.5–51.0)
IMMATURE GRANS (ABS): 0 10*3/uL (ref 0.0–0.1)
IMMATURE GRANULOCYTES: 0 %
LYMPHS: 21 %
Lymphocytes Absolute: 1.6 10*3/uL (ref 0.7–3.1)
MCH: 28.7 pg (ref 26.6–33.0)
MCHC: 32.3 g/dL (ref 31.5–35.7)
MCV: 89 fL (ref 79–97)
MONOCYTES: 9 %
Monocytes Absolute: 0.7 10*3/uL (ref 0.1–0.9)
NEUTROS PCT: 68 %
Neutrophils Absolute: 4.9 10*3/uL (ref 1.4–7.0)
PLATELETS: 159 10*3/uL (ref 150–379)
RBC: 5.89 x10E6/uL — ABNORMAL HIGH (ref 4.14–5.80)
RDW: 14 % (ref 12.3–15.4)
WBC: 7.3 10*3/uL (ref 3.4–10.8)

## 2016-10-27 LAB — BASIC METABOLIC PANEL
BUN/Creatinine Ratio: 18 (ref 9–20)
BUN: 77 mg/dL (ref 6–24)
CALCIUM: 8.8 mg/dL (ref 8.7–10.2)
CHLORIDE: 107 mmol/L — AB (ref 96–106)
CO2: 20 mmol/L (ref 18–29)
Creatinine, Ser: 4.33 mg/dL — ABNORMAL HIGH (ref 0.76–1.27)
GFR calc Af Amer: 17 mL/min/{1.73_m2} — ABNORMAL LOW (ref >59)
GFR, EST NON AFRICAN AMERICAN: 15 mL/min/{1.73_m2} — AB (ref >59)
Glucose: 98 mg/dL (ref 65–99)
POTASSIUM: 5.1 mmol/L (ref 3.5–5.2)
Sodium: 141 mmol/L (ref 134–144)

## 2016-10-27 LAB — AST: AST: 39 IU/L (ref 0–40)

## 2016-10-27 LAB — LIPID PANEL
CHOL/HDL RATIO: 9.2 ratio — AB (ref 0.0–5.0)
Cholesterol, Total: 231 mg/dL — ABNORMAL HIGH (ref 100–199)
HDL: 25 mg/dL — AB (ref >39)
LDL Calculated: 151 mg/dL — ABNORMAL HIGH (ref 0–99)
TRIGLYCERIDES: 277 mg/dL — AB (ref 0–149)
VLDL Cholesterol Cal: 55 mg/dL — ABNORMAL HIGH (ref 5–40)

## 2016-10-27 LAB — TESTOSTERONE: Testosterone: 858 ng/dL (ref 264–916)

## 2016-10-27 LAB — ALT: ALT: 45 IU/L — ABNORMAL HIGH (ref 0–44)

## 2016-10-27 LAB — PSA: Prostate Specific Ag, Serum: 0.5 ng/mL (ref 0.0–4.0)

## 2016-11-02 ENCOUNTER — Telehealth: Payer: Self-pay | Admitting: Family Medicine

## 2016-11-02 DIAGNOSIS — I129 Hypertensive chronic kidney disease with stage 1 through stage 4 chronic kidney disease, or unspecified chronic kidney disease: Secondary | ICD-10-CM

## 2016-11-02 NOTE — Telephone Encounter (Signed)
Patient states he was suppose to call Dr Jeananne Rama back that he has been trying to reach him for over a week.  Thank You Santiago Glad

## 2016-11-02 NOTE — Telephone Encounter (Signed)
Phone call  dicussed with patient that normal blood work.   patint tai cholesterol medication without problems.  we will donate blood or elevated hemoglobin.  unable to get in with nephrology will assist with an appointment.

## 2016-11-02 NOTE — Telephone Encounter (Signed)
Routed result note to Dr. Jeananne Rama for phone call.

## 2016-11-02 NOTE — Telephone Encounter (Signed)
-----   Message from Moss Mc, [place] sent at 11/02/2016  4:59 PM EST ----- Phone call.

## 2017-01-11 ENCOUNTER — Ambulatory Visit: Payer: BLUE CROSS/BLUE SHIELD | Admitting: Family Medicine

## 2017-03-13 ENCOUNTER — Other Ambulatory Visit: Payer: Self-pay | Admitting: Family Medicine

## 2017-03-13 NOTE — Telephone Encounter (Signed)
Routing to provider, appt on 03/15/17.

## 2017-03-14 ENCOUNTER — Other Ambulatory Visit: Payer: Self-pay | Admitting: Family Medicine

## 2017-03-14 NOTE — Telephone Encounter (Signed)
Last OV: 10/26/16 Next OV: 03/15/17  Lab Results  Component Value Date   CHOL 231 (H) 10/26/2016   HDL 25 (L) 10/26/2016   LDLCALC 151 (H) 10/26/2016   TRIG 277 (H) 10/26/2016   CHOLHDL 9.2 (H) 10/26/2016   Lab Results  Component Value Date   CREATININE 4.33 (H) 10/26/2016   BUN 77 (HH) 10/26/2016   NA 141 10/26/2016   K 5.1 10/26/2016   CL 107 (H) 10/26/2016   CO2 20 10/26/2016

## 2017-03-15 ENCOUNTER — Other Ambulatory Visit: Payer: Self-pay | Admitting: Family Medicine

## 2017-03-15 ENCOUNTER — Encounter: Payer: Self-pay | Admitting: Family Medicine

## 2017-03-15 ENCOUNTER — Ambulatory Visit (INDEPENDENT_AMBULATORY_CARE_PROVIDER_SITE_OTHER): Payer: BLUE CROSS/BLUE SHIELD | Admitting: Family Medicine

## 2017-03-15 VITALS — BP 135/88 | HR 76 | Wt 245.0 lb

## 2017-03-15 DIAGNOSIS — N184 Chronic kidney disease, stage 4 (severe): Secondary | ICD-10-CM | POA: Diagnosis not present

## 2017-03-15 DIAGNOSIS — N2581 Secondary hyperparathyroidism of renal origin: Secondary | ICD-10-CM

## 2017-03-15 DIAGNOSIS — E7800 Pure hypercholesterolemia, unspecified: Secondary | ICD-10-CM

## 2017-03-15 DIAGNOSIS — E230 Hypopituitarism: Secondary | ICD-10-CM

## 2017-03-15 DIAGNOSIS — I10 Essential (primary) hypertension: Secondary | ICD-10-CM | POA: Diagnosis not present

## 2017-03-15 DIAGNOSIS — I129 Hypertensive chronic kidney disease with stage 1 through stage 4 chronic kidney disease, or unspecified chronic kidney disease: Secondary | ICD-10-CM

## 2017-03-15 DIAGNOSIS — E079 Disorder of thyroid, unspecified: Secondary | ICD-10-CM | POA: Diagnosis not present

## 2017-03-15 DIAGNOSIS — F329 Major depressive disorder, single episode, unspecified: Secondary | ICD-10-CM

## 2017-03-15 DIAGNOSIS — F32A Depression, unspecified: Secondary | ICD-10-CM

## 2017-03-15 LAB — LP+ALT+AST PICCOLO, WAIVED
ALT (SGPT) PICCOLO, WAIVED: 64 U/L — AB (ref 10–47)
AST (SGOT) PICCOLO, WAIVED: 47 U/L — AB (ref 11–38)
Chol/HDL Ratio Piccolo,Waive: 5.7 mg/dL — ABNORMAL HIGH
Cholesterol Piccolo, Waived: 156 mg/dL (ref <200)
HDL Chol Piccolo, Waived: 28 mg/dL — ABNORMAL LOW (ref >59)
LDL Chol Calc Piccolo Waived: 90 mg/dL (ref <100)
Triglycerides Piccolo,Waived: 193 mg/dL — ABNORMAL HIGH (ref <150)
VLDL CHOL CALC PICCOLO,WAIVE: 39 mg/dL — AB (ref <30)

## 2017-03-15 MED ORDER — TESTOSTERONE CYPIONATE 200 MG/ML IM SOLN: 100.0000 mg | INTRAMUSCULAR | 3 refills | Status: DC

## 2017-03-15 MED ORDER — TORSEMIDE 20 MG PO TABS: 20.0000 mg | ORAL_TABLET | Freq: Every day | ORAL | 7 refills | Status: DC

## 2017-03-15 MED ORDER — AZITHROMYCIN 250 MG PO TABS: ORAL_TABLET | ORAL | 0 refills | Status: DC

## 2017-03-15 MED ORDER — ALPRAZOLAM 1 MG PO TABS: 1.0000 mg | ORAL_TABLET | Freq: Two times a day (BID) | ORAL | 5 refills | Status: DC | PRN

## 2017-03-15 NOTE — Assessment & Plan Note (Signed)
The current medical regimen is effective;  continue present plan and medications.  

## 2017-03-15 NOTE — Assessment & Plan Note (Signed)
Followed by nephrology. 

## 2017-03-15 NOTE — Progress Notes (Signed)
BP 135/88   Pulse 76   Wt 245 lb (111.1 kg)   SpO2 98%   BMI 37.25 kg/m    Subjective:    Patient ID: Dylan Chandler, male    DOB: 26-Jul-1966, 51 y.o.   MRN: [id number]  HPI: Dylan Chandler is a 51 y.o. male  Chief Complaint  Patient presents with  . Follow-up   Follow-up with patient for renal failure. Patient stable for now followed by nephrology have blood work to check as recommended by nephrology of BMP and PTH. CAD no complaints taking Crestor 5 mg with no problems and faithfully. Depression stable with medications. Testosterone no complaints  Relevant past medical, surgical, family and social history reviewed and updated as indicated. Interim medical history since our last visit reviewed. Allergies and medications reviewed and updated.  Review of Systems  Constitutional: Negative.   Respiratory: Negative.   Cardiovascular: Negative.     Per HPI unless specifically indicated above     Objective:    BP 135/88   Pulse 76   Wt 245 lb (111.1 kg)   SpO2 98%   BMI 37.25 kg/m   Wt Readings from Last 3 Encounters:  03/15/17 245 lb (111.1 kg)  10/26/16 245 lb (111.1 kg)  06/11/16 239 lb 8 oz (108.6 kg)    Physical Exam  Constitutional: He is oriented to person, place, and time. He appears well-developed and well-nourished.  HENT:  Head: Normocephalic and atraumatic.  Eyes: Conjunctivae and EOM are normal.  Neck: Normal range of motion.  Cardiovascular: Normal rate, regular rhythm and normal heart sounds.   Pulmonary/Chest: Effort normal and breath sounds normal.  Slight rhonchi with cough consistent with patient recovering from a cold  Musculoskeletal: Normal range of motion.  Neurological: He is alert and oriented to person, place, and time.  Skin: No erythema.  Psychiatric: He has a normal mood and affect. His behavior is normal. Judgment and thought content normal.    Results for orders placed or performed in visit on 10/26/16  Testosterone    Result Value Ref Range   Testosterone 858 264 - 916 ng/dL  CBC with Differential/Platelet  Result Value Ref Range   WBC 7.3 3.4 - 10.8 x10E3/uL   RBC 5.89 (H) 4.14 - 5.80 x10E6/uL   Hemoglobin 16.9 13.0 - 17.7 g/dL   Hematocrit 52.3 (H) 37.5 - 51.0 %   MCV 89 79 - 97 fL   MCH 28.7 26.6 - 33.0 pg   MCHC 32.3 31.5 - 35.7 g/dL   RDW 14.0 12.3 - 15.4 %   Platelets 159 150 - 379 x10E3/uL   Neutrophils 68 Not Estab. %   Lymphs 21 Not Estab. %   Monocytes 9 Not Estab. %   Eos 2 Not Estab. %   Basos 0 Not Estab. %   Neutrophils Absolute 4.9 1.4 - 7.0 x10E3/uL   Lymphocytes Absolute 1.6 0.7 - 3.1 x10E3/uL   Monocytes Absolute 0.7 0.1 - 0.9 x10E3/uL   EOS (ABSOLUTE) 0.2 0.0 - 0.4 x10E3/uL   Basophils Absolute 0.0 0.0 - 0.2 x10E3/uL   Immature Granulocytes 0 Not Estab. %   Immature Grans (Abs) 0.0 0.0 - 0.1 x10E3/uL  PSA  Result Value Ref Range   Prostate Specific Ag, Serum 0.5 0.0 - 4.0 ng/mL  Basic Metabolic Panel (BMET)  Result Value Ref Range   Glucose 98 65 - 99 mg/dL   BUN 77 (HH) 6 - 24 mg/dL   Creatinine, Ser 4.33 (H) 0.76 -  1.27 mg/dL   GFR calc non Af Amer 15 (L) >59 mL/min/1.73   GFR calc Af Amer 17 (L) >59 mL/min/1.73   BUN/Creatinine Ratio 18 9 - 20   Sodium 141 134 - 144 mmol/L   Potassium 5.1 3.5 - 5.2 mmol/L   Chloride 107 (H) 96 - 106 mmol/L   CO2 20 18 - 29 mmol/L   Calcium 8.8 8.7 - 10.2 mg/dL  Lipid panel  Result Value Ref Range   Cholesterol, Total 231 (H) 100 - 199 mg/dL   Triglycerides 277 (H) 0 - 149 mg/dL   HDL 25 (L) >39 mg/dL   VLDL Cholesterol Cal 55 (H) 5 - 40 mg/dL   LDL Calculated 151 (H) 0 - 99 mg/dL   Chol/HDL Ratio 9.2 (H) 0.0 - 5.0 ratio units  ALT  Result Value Ref Range   ALT 45 (H) 0 - 44 IU/L  AST  Result Value Ref Range   AST 39 0 - 40 IU/L      Assessment & Plan:   Problem List Items Addressed This Visit      Cardiovascular and Mediastinum   Hypertension - Primary    The current medical regimen is effective;  continue  present plan and medications.       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         Other   Hyperlipidemia    Marked improvement on cholesterol medication      Relevant Medications   metoprolol tartrate (LOPRESSOR) 25 MG tablet   torsemide (DEMADEX) 20 MG tablet   Other Relevant Orders   Basic metabolic panel   LP+ALT+AST Piccolo, Waived   Depression   Relevant Medications   ALPRAZolam (XANAX) 1 MG tablet    Other Visit Diagnoses    Chronic kidney disease, stage IV (severe) ([place])       Relevant Orders   Renal function panel       Follow up plan: Return in about 6 months (around 09/15/2017) for Physical Exam testosterone levels.

## 2017-03-15 NOTE — Assessment & Plan Note (Signed)
Patient doing well taking medication

## 2017-03-15 NOTE — Assessment & Plan Note (Signed)
Marked improvement on cholesterol medication

## 2017-03-16 ENCOUNTER — Encounter: Payer: Self-pay | Admitting: Family Medicine

## 2017-03-16 ENCOUNTER — Other Ambulatory Visit: Payer: Self-pay | Admitting: Family Medicine

## 2017-03-16 DIAGNOSIS — I10 Essential (primary) hypertension: Secondary | ICD-10-CM

## 2017-03-16 LAB — RENAL FUNCTION PANEL
ALBUMIN: 3.5 g/dL (ref 3.5–5.5)
BUN/Creatinine Ratio: 15 (ref 9–20)
BUN: 56 mg/dL — AB (ref 6–24)
CALCIUM: 8.7 mg/dL (ref 8.7–10.2)
CHLORIDE: 101 mmol/L (ref 96–106)
CO2: 20 mmol/L (ref 18–29)
Creatinine, Ser: 3.85 mg/dL — ABNORMAL HIGH (ref 0.76–1.27)
GFR calc Af Amer: 20 mL/min/{1.73_m2} — ABNORMAL LOW (ref >59)
GFR calc non Af Amer: 17 mL/min/{1.73_m2} — ABNORMAL LOW (ref >59)
Glucose: 75 mg/dL (ref 65–99)
PHOSPHORUS: 4.1 mg/dL (ref 2.5–4.5)
Potassium: 4.3 mmol/L (ref 3.5–5.2)
Sodium: 138 mmol/L (ref 134–144)

## 2017-03-16 LAB — PARATHYROID HORMONE, INTACT (NO CA): PTH: 123 pg/mL — AB (ref 15–65)

## 2017-03-16 LAB — PSA: Prostate Specific Ag, Serum: 0.5 ng/mL (ref 0.0–4.0)

## 2017-06-22 ENCOUNTER — Other Ambulatory Visit: Payer: Self-pay | Admitting: Adult Health

## 2017-06-23 ENCOUNTER — Other Ambulatory Visit: Payer: Self-pay | Admitting: Adult Health

## 2017-06-23 NOTE — Telephone Encounter (Signed)
Called and left generic message for patient to call back. Need to verify he needs refill of ramipril and which provider he is/has seen.

## 2017-06-26 ENCOUNTER — Other Ambulatory Visit: Payer: Self-pay | Admitting: Family Medicine

## 2017-06-26 DIAGNOSIS — E230 Hypopituitarism: Secondary | ICD-10-CM

## 2017-06-26 NOTE — Telephone Encounter (Signed)
REFILL 

## 2017-06-26 NOTE — Telephone Encounter (Signed)
Per discharge summary: . Follow-up appointments will be made with Dr. Oval Linsey our team providers  posthospitalization.

## 2017-06-27 MED ORDER — RAMIPRIL 10 MG PO CAPS: 10.0000 mg | ORAL_CAPSULE | Freq: Two times a day (BID) | ORAL | 10 refills | Status: DC

## 2017-06-27 NOTE — Telephone Encounter (Signed)
Pharmacy called to check in on patient's refills for testosterone and ramipril.'  Patient has been taking twice a day for ramipril and informed pharmacy that twice a day is his new given dosage.  Please Advise.  Thank you

## 2017-06-27 NOTE — Telephone Encounter (Signed)
Left message on machine for pt to return call to the office.  

## 2017-06-27 NOTE — Telephone Encounter (Signed)
Patient was transferred to provider for telephone conversation.   

## 2017-06-27 NOTE — Telephone Encounter (Signed)
Call pt 

## 2017-06-27 NOTE — Telephone Encounter (Signed)
Discussed with patient needs to get testosterone CBC and PSA drawn patient will come by and have these labs done.

## 2017-06-27 NOTE — Telephone Encounter (Signed)
  Last routine OV: 03/15/17 Next OV: 10/04/17

## 2017-06-30 ENCOUNTER — Other Ambulatory Visit: Payer: BLUE CROSS/BLUE SHIELD

## 2017-06-30 DIAGNOSIS — E230 Hypopituitarism: Secondary | ICD-10-CM

## 2017-07-01 LAB — CBC WITH DIFFERENTIAL/PLATELET
BASOS: 0 %
Basophils Absolute: 0 10*3/uL (ref 0.0–0.2)
EOS (ABSOLUTE): 0.2 10*3/uL (ref 0.0–0.4)
EOS: 2 %
HEMATOCRIT: 49.1 % (ref 37.5–51.0)
Hemoglobin: 16 g/dL (ref 13.0–17.7)
Immature Grans (Abs): 0 10*3/uL (ref 0.0–0.1)
Immature Granulocytes: 0 %
Lymphocytes Absolute: 2.2 10*3/uL (ref 0.7–3.1)
Lymphs: 24 %
MCH: 28.6 pg (ref 26.6–33.0)
MCHC: 32.6 g/dL (ref 31.5–35.7)
MCV: 88 fL (ref 79–97)
MONOS ABS: 0.7 10*3/uL (ref 0.1–0.9)
Monocytes: 8 %
NEUTROS ABS: 6.1 10*3/uL (ref 1.4–7.0)
Neutrophils: 66 %
Platelets: 192 10*3/uL (ref 150–379)
RBC: 5.59 x10E6/uL (ref 4.14–5.80)
RDW: 15.8 % — AB (ref 12.3–15.4)
WBC: 9.3 10*3/uL (ref 3.4–10.8)

## 2017-07-01 LAB — TESTOSTERONE: Testosterone: 891 ng/dL (ref 264–916)

## 2017-07-01 LAB — PSA: Prostate Specific Ag, Serum: 0.5 ng/mL (ref 0.0–4.0)

## 2017-07-03 ENCOUNTER — Encounter: Payer: Self-pay | Admitting: Family Medicine

## 2017-07-07 ENCOUNTER — Other Ambulatory Visit: Payer: Self-pay | Admitting: Family Medicine

## 2017-07-07 DIAGNOSIS — E230 Hypopituitarism: Secondary | ICD-10-CM

## 2017-07-13 ENCOUNTER — Other Ambulatory Visit: Payer: Self-pay

## 2017-07-13 MED ORDER — RAMIPRIL 10 MG PO CAPS: 10.0000 mg | ORAL_CAPSULE | Freq: Two times a day (BID) | ORAL | 3 refills | Status: DC

## 2017-09-06 ENCOUNTER — Other Ambulatory Visit: Payer: Self-pay | Admitting: Family Medicine

## 2017-09-09 ENCOUNTER — Other Ambulatory Visit: Payer: Self-pay | Admitting: Family Medicine

## 2017-09-12 ENCOUNTER — Other Ambulatory Visit: Payer: Self-pay | Admitting: Family Medicine

## 2017-09-12 DIAGNOSIS — F32A Depression, unspecified: Secondary | ICD-10-CM

## 2017-09-12 DIAGNOSIS — F329 Major depressive disorder, single episode, unspecified: Secondary | ICD-10-CM

## 2017-09-12 NOTE — Telephone Encounter (Signed)
Controlled substance 

## 2017-09-13 NOTE — Telephone Encounter (Signed)
Forwarding this controlled item.

## 2017-09-27 ENCOUNTER — Ambulatory Visit: Payer: BLUE CROSS/BLUE SHIELD | Admitting: Cardiovascular Disease

## 2017-10-04 ENCOUNTER — Encounter: Payer: Self-pay | Admitting: Family Medicine

## 2017-10-04 ENCOUNTER — Ambulatory Visit: Payer: BLUE CROSS/BLUE SHIELD | Admitting: Family Medicine

## 2017-10-04 VITALS — BP 136/87 | HR 70 | Ht 68.0 in | Wt 245.0 lb

## 2017-10-04 DIAGNOSIS — F32A Depression, unspecified: Secondary | ICD-10-CM

## 2017-10-04 DIAGNOSIS — Z114 Encounter for screening for human immunodeficiency virus [HIV]: Secondary | ICD-10-CM

## 2017-10-04 DIAGNOSIS — E079 Disorder of thyroid, unspecified: Secondary | ICD-10-CM

## 2017-10-04 DIAGNOSIS — I129 Hypertensive chronic kidney disease with stage 1 through stage 4 chronic kidney disease, or unspecified chronic kidney disease: Secondary | ICD-10-CM

## 2017-10-04 DIAGNOSIS — F329 Major depressive disorder, single episode, unspecified: Secondary | ICD-10-CM

## 2017-10-04 DIAGNOSIS — E230 Hypopituitarism: Secondary | ICD-10-CM | POA: Diagnosis not present

## 2017-10-04 DIAGNOSIS — I10 Essential (primary) hypertension: Secondary | ICD-10-CM

## 2017-10-04 DIAGNOSIS — N2581 Secondary hyperparathyroidism of renal origin: Secondary | ICD-10-CM

## 2017-10-04 DIAGNOSIS — I2102 ST elevation (STEMI) myocardial infarction involving left anterior descending coronary artery: Secondary | ICD-10-CM | POA: Diagnosis not present

## 2017-10-04 DIAGNOSIS — Z0000 Encounter for general adult medical examination without abnormal findings: Secondary | ICD-10-CM | POA: Diagnosis not present

## 2017-10-04 DIAGNOSIS — E7800 Pure hypercholesterolemia, unspecified: Secondary | ICD-10-CM | POA: Diagnosis not present

## 2017-10-04 LAB — URINALYSIS, ROUTINE W REFLEX MICROSCOPIC
Bilirubin, UA: NEGATIVE
Glucose, UA: NEGATIVE
Ketones, UA: NEGATIVE
LEUKOCYTES UA: NEGATIVE
Nitrite, UA: NEGATIVE
PH UA: 5.5 (ref 5.0–7.5)
Specific Gravity, UA: 1.015 (ref 1.005–1.030)
UUROB: 0.2 mg/dL (ref 0.2–1.0)

## 2017-10-04 LAB — MICROSCOPIC EXAMINATION
Bacteria, UA: NONE SEEN
RBC, UA: NONE SEEN /hpf (ref 0–?)

## 2017-10-04 MED ORDER — ROSUVASTATIN CALCIUM 5 MG PO TABS: 5.0000 mg | ORAL_TABLET | Freq: Every day | ORAL | 4 refills | Status: DC

## 2017-10-04 MED ORDER — NEFAZODONE HCL 150 MG PO TABS: 150.0000 mg | ORAL_TABLET | Freq: Two times a day (BID) | ORAL | 4 refills | Status: DC

## 2017-10-04 MED ORDER — ALPRAZOLAM 1 MG PO TABS: 1.0000 mg | ORAL_TABLET | Freq: Two times a day (BID) | ORAL | 5 refills | Status: DC

## 2017-10-04 MED ORDER — SERTRALINE HCL 100 MG PO TABS: 200.0000 mg | ORAL_TABLET | Freq: Two times a day (BID) | ORAL | 4 refills | Status: DC

## 2017-10-04 MED ORDER — TORSEMIDE 20 MG PO TABS: 20.0000 mg | ORAL_TABLET | Freq: Every day | ORAL | 7 refills | Status: DC

## 2017-10-04 MED ORDER — TESTOSTERONE CYPIONATE 200 MG/ML IM SOLN: 100.0000 mg | INTRAMUSCULAR | 0 refills | Status: DC

## 2017-10-04 NOTE — Assessment & Plan Note (Signed)
The current medical regimen is effective;  continue present plan and medications.  

## 2017-10-04 NOTE — Progress Notes (Signed)
BP 136/87   Pulse 70   Ht 5\' 8"  (1.727 m)   Wt 245 lb (111.1 kg)   SpO2 95%   BMI 37.25 kg/m    Subjective:    Patient ID: Dylan Chandler, male    DOB: 13-Dec-1965, 51 y.o.   MRN: [id number]  HPI: Dylan Chandler is a 51 y.o. male  Chief Complaint  Patient presents with  . Annual Exam  patient all in all doing okay still having a great deal of stress taking Xanax 1 mg today Uses his been consistent and stable for years. It helps manage stress anxiety at best okay. Depression stable on high-dose Serzone. Also taking Zoloft high dose. Patient's been through psychiatric review and all isstable and will maintain. Thyroid doing well without problems Testosterone replacement stable using an 100 mg every week and maintaining levels appropriately. Patient injected  this morning.  CK D stable following with nephrology patient and stage IV renal failure. Concerned patient's been having bouts SI joint pain and is been taking Advil and Tylenol.Encouraged patient again no nonsteroidal anti-inflammatories. Patient's CAD doing stable but unfortunately not taking cholesterol medications.Did a prolonged self test to see if contributing to any of his SI pain.  Relevant past medical, surgical, family and social history reviewed and updated as indicated. Interim medical history since our last visit reviewed. Allergies and medications reviewed and updated.  Review of Systems  Constitutional: Negative.   HENT: Negative.   Eyes: Negative.   Respiratory: Negative.   Cardiovascular: Negative.   Gastrointestinal: Negative.   Endocrine: Negative.   Genitourinary: Negative.   Musculoskeletal: Negative.   Skin: Negative.   Allergic/Immunologic: Negative.   Neurological: Negative.   Hematological: Negative.   Psychiatric/Behavioral: Negative.     Per HPI unless specifically indicated above     Objective:    BP 136/87   Pulse 70   Ht 5\' 8"  (1.727 m)   Wt 245 lb (111.1 kg)   SpO2 95%    BMI 37.25 kg/m   Wt Readings from Last 3 Encounters:  10/04/17 245 lb (111.1 kg)  03/15/17 245 lb (111.1 kg)  10/26/16 245 lb (111.1 kg)    Physical Exam  Constitutional: He is oriented to person, place, and time. He appears well-developed and well-nourished.  HENT:  Head: Normocephalic and atraumatic.  Right Ear: External ear normal.  Left Ear: External ear normal.  Eyes: Conjunctivae and EOM are normal. Pupils are equal, round, and reactive to light.  Neck: Normal range of motion. Neck supple.  Cardiovascular: Normal rate, regular rhythm, normal heart sounds and intact distal pulses.  Pulmonary/Chest: Effort normal and breath sounds normal.  Abdominal: Soft. Bowel sounds are normal. There is no splenomegaly or hepatomegaly.  Genitourinary: Rectum normal, prostate normal and penis normal.  Musculoskeletal: Normal range of motion.  Neurological: He is alert and oriented to person, place, and time. He has normal reflexes.  Skin: No rash noted. No erythema.  Psychiatric: He has a normal mood and affect. His behavior is normal. Judgment and thought content normal.    Results for orders placed or performed in visit on 06/30/17  CBC with Differential/Platelet  Result Value Ref Range   WBC 9.3 3.4 - 10.8 x10E3/uL   RBC 5.59 4.14 - 5.80 x10E6/uL   Hemoglobin 16.0 13.0 - 17.7 g/dL   Hematocrit 49.1 37.5 - 51.0 %   MCV 88 79 - 97 fL   MCH 28.6 26.6 - 33.0 pg   MCHC 32.6 31.5 -  35.7 g/dL   RDW 15.8 (H) 12.3 - 15.4 %   Platelets 192 150 - 379 x10E3/uL   Neutrophils 66 Not Estab. %   Lymphs 24 Not Estab. %   Monocytes 8 Not Estab. %   Eos 2 Not Estab. %   Basos 0 Not Estab. %   Neutrophils Absolute 6.1 1.4 - 7.0 x10E3/uL   Lymphocytes Absolute 2.2 0.7 - 3.1 x10E3/uL   Monocytes Absolute 0.7 0.1 - 0.9 x10E3/uL   EOS (ABSOLUTE) 0.2 0.0 - 0.4 x10E3/uL   Basophils Absolute 0.0 0.0 - 0.2 x10E3/uL   Immature Granulocytes 0 Not Estab. %   Immature Grans (Abs) 0.0 0.0 - 0.1 x10E3/uL  PSA   Result Value Ref Range   Prostate Specific Ag, Serum 0.5 0.0 - 4.0 ng/mL  Testosterone  Result Value Ref Range   Testosterone 891 264 - 916 ng/dL      Assessment & Plan:   Problem List Items Addressed This Visit      Cardiovascular and Mediastinum   Hypertension - Primary    The current medical regimen is effective;  continue present plan and medications.       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       Relevant Medications   testosterone cypionate (DEPOTESTOSTERONE CYPIONATE) 200 MG/ML injection   Other Relevant Orders   Testosterone   Secondary hyperparathyroidism of renal origin ([place])     Genitourinary   Hypertensive chronic kidney disease with stage 1 through stage 4 chronic kidney disease, or unspecified chronic kidney disease    Followed by nephrology of concern patient taking Advil        Other   Hyperlipidemia    Patient with not taking Crestor encouraged to take medications because of coronary artery disease.      Relevant Medications   rosuvastatin (CRESTOR) 5 MG tablet   torsemide (DEMADEX) 20 MG tablet   Depression    Depression anxiety stable on high-dose medications for details see psychiatry's note from Dr. Geryl Councilman.      Relevant Medications   ALPRAZolam (XANAX) 1 MG tablet   nefazodone (SERZONE) 150 MG tablet   sertraline (ZOLOFT) 100 MG tablet    Other Visit Diagnoses    Encounter for screening for HIV       PE (physical exam), annual       Relevant Orders   HIV antibody   CBC with Differential/Platelet   Comprehensive  metabolic panel   Lipid panel   PSA   TSH   Urinalysis, Routine w reflex microscopic       Follow up plan: Return in about 6 months (around 04/03/2018) for BMP,  Lipids, ALT, AST,testosterone.

## 2017-10-04 NOTE — Assessment & Plan Note (Signed)
Depression anxiety stable on high-dose medications for details see psychiatry's note from Dr. Geryl Councilman.

## 2017-10-04 NOTE — Assessment & Plan Note (Signed)
Patient with not taking Crestor encouraged to take medications because of coronary artery disease.

## 2017-10-04 NOTE — Assessment & Plan Note (Signed)
Followed by nephrology of concern patient taking Advil

## 2017-10-07 LAB — COMPREHENSIVE METABOLIC PANEL
ALBUMIN: 3.9 g/dL (ref 3.5–5.5)
ALK PHOS: 78 IU/L (ref 39–117)
ALT: 50 IU/L — ABNORMAL HIGH (ref 0–44)
AST: 41 IU/L — ABNORMAL HIGH (ref 0–40)
Albumin/Globulin Ratio: 1.7 (ref 1.2–2.2)
BILIRUBIN TOTAL: 0.3 mg/dL (ref 0.0–1.2)
BUN / CREAT RATIO: 18 (ref 9–20)
BUN: 87 mg/dL (ref 6–24)
CHLORIDE: 102 mmol/L (ref 96–106)
CO2: 20 mmol/L (ref 20–29)
CREATININE: 4.72 mg/dL — AB (ref 0.76–1.27)
Calcium: 8.4 mg/dL — ABNORMAL LOW (ref 8.7–10.2)
GFR calc Af Amer: 15 mL/min/{1.73_m2} — ABNORMAL LOW (ref >59)
GFR calc non Af Amer: 13 mL/min/{1.73_m2} — ABNORMAL LOW (ref >59)
GLOBULIN, TOTAL: 2.3 g/dL (ref 1.5–4.5)
Glucose: 84 mg/dL (ref 65–99)
Potassium: 5.2 mmol/L (ref 3.5–5.2)
SODIUM: 140 mmol/L (ref 134–144)
Total Protein: 6.2 g/dL (ref 6.0–8.5)

## 2017-10-07 LAB — CBC WITH DIFFERENTIAL/PLATELET
Basophils Absolute: 0 10*3/uL (ref 0.0–0.2)
Basos: 0 %
EOS (ABSOLUTE): 0.2 10*3/uL (ref 0.0–0.4)
EOS: 3 %
HEMATOCRIT: 46 % (ref 37.5–51.0)
HEMOGLOBIN: 14.9 g/dL (ref 13.0–17.7)
Immature Grans (Abs): 0 10*3/uL (ref 0.0–0.1)
Immature Granulocytes: 0 %
LYMPHS ABS: 1.5 10*3/uL (ref 0.7–3.1)
Lymphs: 20 %
MCH: 29 pg (ref 26.6–33.0)
MCHC: 32.4 g/dL (ref 31.5–35.7)
MCV: 90 fL (ref 79–97)
MONOCYTES: 12 %
Monocytes Absolute: 0.9 10*3/uL (ref 0.1–0.9)
NEUTROS ABS: 4.8 10*3/uL (ref 1.4–7.0)
Neutrophils: 65 %
Platelets: 184 10*3/uL (ref 150–379)
RBC: 5.14 x10E6/uL (ref 4.14–5.80)
RDW: 14.1 % (ref 12.3–15.4)
WBC: 7.4 10*3/uL (ref 3.4–10.8)

## 2017-10-07 LAB — LIPID PANEL
CHOLESTEROL TOTAL: 230 mg/dL — AB (ref 100–199)
Chol/HDL Ratio: 7.9 ratio — ABNORMAL HIGH (ref 0.0–5.0)
HDL: 29 mg/dL — ABNORMAL LOW (ref >39)
LDL CALC: 163 mg/dL — AB (ref 0–99)
Triglycerides: 192 mg/dL — ABNORMAL HIGH (ref 0–149)
VLDL Cholesterol Cal: 38 mg/dL (ref 5–40)

## 2017-10-07 LAB — TSH: TSH: 0.428 u[IU]/mL — AB (ref 0.450–4.500)

## 2017-10-07 LAB — PSA: Prostate Specific Ag, Serum: 0.7 ng/mL (ref 0.0–4.0)

## 2017-10-07 LAB — CYSTATIN C: CYSTATIN C: 2.22 mg/L — AB (ref 0.53–0.95)

## 2017-10-12 ENCOUNTER — Other Ambulatory Visit: Payer: Self-pay

## 2017-10-12 ENCOUNTER — Encounter: Payer: Self-pay | Admitting: Anesthesiology

## 2017-10-12 ENCOUNTER — Ambulatory Visit: Payer: BLUE CROSS/BLUE SHIELD | Attending: Anesthesiology | Admitting: Anesthesiology

## 2017-10-12 VITALS — BP 144/92 | HR 97 | Temp 97.9°F | Resp 16 | Ht 69.0 in | Wt 248.0 lb

## 2017-10-12 DIAGNOSIS — I2510 Atherosclerotic heart disease of native coronary artery without angina pectoris: Secondary | ICD-10-CM | POA: Insufficient documentation

## 2017-10-12 DIAGNOSIS — G4730 Sleep apnea, unspecified: Secondary | ICD-10-CM | POA: Insufficient documentation

## 2017-10-12 DIAGNOSIS — F329 Major depressive disorder, single episode, unspecified: Secondary | ICD-10-CM | POA: Diagnosis not present

## 2017-10-12 DIAGNOSIS — E079 Disorder of thyroid, unspecified: Secondary | ICD-10-CM | POA: Insufficient documentation

## 2017-10-12 DIAGNOSIS — M25551 Pain in right hip: Secondary | ICD-10-CM | POA: Insufficient documentation

## 2017-10-12 DIAGNOSIS — I129 Hypertensive chronic kidney disease with stage 1 through stage 4 chronic kidney disease, or unspecified chronic kidney disease: Secondary | ICD-10-CM | POA: Diagnosis not present

## 2017-10-12 DIAGNOSIS — G8929 Other chronic pain: Secondary | ICD-10-CM | POA: Diagnosis not present

## 2017-10-12 DIAGNOSIS — Z9889 Other specified postprocedural states: Secondary | ICD-10-CM | POA: Diagnosis not present

## 2017-10-12 DIAGNOSIS — M545 Low back pain: Secondary | ICD-10-CM | POA: Diagnosis not present

## 2017-10-12 DIAGNOSIS — N183 Chronic kidney disease, stage 3 (moderate): Secondary | ICD-10-CM | POA: Diagnosis not present

## 2017-10-12 DIAGNOSIS — M533 Sacrococcygeal disorders, not elsewhere classified: Secondary | ICD-10-CM

## 2017-10-12 DIAGNOSIS — Z79899 Other long term (current) drug therapy: Secondary | ICD-10-CM | POA: Diagnosis not present

## 2017-10-12 DIAGNOSIS — E230 Hypopituitarism: Secondary | ICD-10-CM | POA: Insufficient documentation

## 2017-10-12 DIAGNOSIS — E785 Hyperlipidemia, unspecified: Secondary | ICD-10-CM | POA: Diagnosis not present

## 2017-10-12 DIAGNOSIS — R011 Cardiac murmur, unspecified: Secondary | ICD-10-CM | POA: Insufficient documentation

## 2017-10-12 MED ORDER — ROPIVACAINE HCL 2 MG/ML IJ SOLN
16.0000 mL | Freq: Once | INTRAMUSCULAR | Status: AC
  Administered 2017-10-12: 8 mL

## 2017-10-12 MED ORDER — DEXAMETHASONE SODIUM PHOSPHATE 10 MG/ML IJ SOLN
INTRAMUSCULAR | Status: AC
  Filled 2017-10-12: qty 2

## 2017-10-12 MED ORDER — ROPIVACAINE HCL 2 MG/ML IJ SOLN
INTRAMUSCULAR | Status: AC
  Filled 2017-10-12: qty 20

## 2017-10-12 MED ORDER — DEXAMETHASONE SODIUM PHOSPHATE 10 MG/ML IJ SOLN
10.0000 mg | Freq: Once | INTRAMUSCULAR | Status: AC
  Administered 2017-10-12: 10 mg

## 2017-10-12 NOTE — Progress Notes (Signed)
Safety precautions to be maintained throughout the outpatient stay will include: orient to surroundings, keep bed in low position, maintain call bell within reach at all times, provide assistance with transfer out of bed and ambulation.  

## 2017-10-13 ENCOUNTER — Telehealth: Payer: Self-pay

## 2017-10-13 NOTE — Progress Notes (Signed)
Subjective:  Patient ID: Dylan Chandler, male    DOB: Nov 27, 1965  Age: 51 y.o. MRN: [id number]  CC: Hip Pain (right)     PROCEDURE: Right lumbar trigger point injection x4  HPI Dylan Chandler presents for new patient evaluation.  He is a pleasant 51 year old white male with a long-standing history of right lower back pain that has been present over 6 weeks.  The pain seems to be worse when he is standing for prolonged periods of time or sitting but generally is better with movement.  He rates this pain is a maximum pain score of 9 out of 10 best a 5 and averages about a 7.  No recent changes have been noted in the quality of the pain.  He is referred by Dylan Chandler for evaluation.  He states that the pain is aggravated by prolonged sitting and standing and better with medication management.  He has been somewhat depressed secondary to the chronicity of the pain and he describes it as a sharp throbbing toothlike pain in the right lower back with occasional radiation into the right hip and right groin but not down the leg.  He denies any loss of strength or sensation to the lower extremities with no bowel or bladder dysfunction.  He went through a course of oral steroids and these helped transiently.  However the pain still persists.  History Opie has a past medical history of CAD (coronary artery disease), Cardiac murmur, Chronic kidney disease, stage III (moderate) ([place]), Depression, Elevated SGOT, Hyperlipidemia, Hypertension, Hypogonadotropic hypogonadism 1 ([place]), Sleep apnea, and Thyroid disease.   He has a past surgical history that includes Laser ablation (Right); phlebectomy (Right); Shoulder surgery; Cardiac catheterization (N/A, 06/09/2016); and Cardiac catheterization (N/A, 06/09/2016).   His family history includes AAA (abdominal aortic aneurysm) in his father; Cancer in his father and mother; Hyperlipidemia in his father and mother.He reports that  has never smoked. he has never used  smokeless tobacco. He reports that he does not drink alcohol or use drugs.  No results found for this or any previous visit.  No results found for: TOXASSSELUR  Outpatient Medications Prior to Visit  Medication Sig Dispense Refill  . ALPRAZolam (XANAX) 1 MG tablet Take 1 tablet (1 mg total) by mouth 2 (two) times daily. 60 tablet 5  . aspirin EC 325 MG tablet Take 325 mg by mouth daily.    . calcitRIOL (ROCALTROL) 0.25 MCG capsule   5  . liothyronine (CYTOMEL) 25 MCG tablet TAKE 3 TABLETS (75 MCG TOTAL) BY MOUTH DAILY. 90 tablet 5  . nefazodone (SERZONE) 150 MG tablet Take 1 tablet (150 mg total) by mouth 2 (two) times daily. 180 tablet 4  . ramipril (ALTACE) 10 MG capsule Take 1 capsule (10 mg total) by mouth 2 (two) times daily. 180 capsule 3  . rosuvastatin (CRESTOR) 5 MG tablet Take 1 tablet (5 mg total) by mouth daily. 90 tablet 4  . sertraline (ZOLOFT) 100 MG tablet Take 2 tablets (200 mg total) by mouth 2 (two) times daily. 360 tablet 4  . testosterone cypionate (DEPOTESTOSTERONE CYPIONATE) 200 MG/ML injection Inject 0.5 mLs (100 mg total) into the muscle once a week. 10 mL 0  . torsemide (DEMADEX) 20 MG tablet Take 1 tablet (20 mg total) by mouth daily. 30 tablet 7  . metoprolol tartrate (LOPRESSOR) 25 MG tablet Take 25 mg by mouth 2 (two) times daily.     No facility-administered medications prior to visit.  Lab Results  Component Value Date   WBC 7.4 10/04/2017   HGB 14.9 10/04/2017   HCT 46.0 10/04/2017   PLT 184 10/04/2017   GLUCOSE 84 10/04/2017   CHOL 230 (H) 10/04/2017   TRIG 192 (H) 10/04/2017   HDL 29 (L) 10/04/2017   LDLCALC 163 (H) 10/04/2017   ALT 50 (H) 10/04/2017   AST 41 (H) 10/04/2017   NA 140 10/04/2017   K 5.2 10/04/2017   CL 102 10/04/2017   CREATININE 4.72 (H) 10/04/2017   BUN 87 (HH) 10/04/2017   CO2 20 10/04/2017   TSH 0.428 (L) 10/04/2017   INR 2.19 06/09/2016   HGBA1C 5.3 06/11/2016     --------------------------------------------------------------------------------------------------------------------- US Renal  Result Date: 06/10/2016 CLINICAL DATA:  Stage 4 kidney disease. EXAM: RENAL / URINARY TRACT ULTRASOUND COMPLETE COMPARISON:  None. FINDINGS: Right Kidney: Length: 10.0 cm, within normal limits. The parenchyma is slightly hyperechoic to the index organ, the liver. No focal lesions are present. There is no stone or hydronephrosis. Left Kidney: Length: 11.3 cm, within normal limits. The parenchyma is isoechoic to the index organ, the spleen. No focal lesions are present. Minimal pelvic dilation versus a more likely extra renal pelvis is noted. No focal mass lesion is present. There is no stone. Bladder: The left ureteral jet is visualized. The right ureteral jet is not visualized. The bladder is otherwise unremarkable. IMPRESSION: 1. The kidneys are hyperechoic bilaterally. This is nonspecific, but commonly seen in the setting of medical renal disease. 2. Slight left collecting system dilation versus more likely an extra renal pelvis. 3. No stone or mass lesion and either kidney. Electronically Signed   By: San Morelle M.D.   On: 06/10/2016 20:16      ---------------------------------------------------------------------------------------------------------------------- Past Medical History:  Diagnosis Date  . CAD (coronary artery disease)   . Cardiac murmur   . Chronic kidney disease, stage III (moderate) ([facility])   . Depression   . Elevated SGOT   . Hyperlipidemia   . Hypertension   . 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 Sleep apnea   . Thyroid disease     Past Surgical History:  Procedure Laterality Date  . CARDIAC CATHETERIZATION N/A 06/09/2016   Procedure: Left Heart Cath and Coronary Angiography;  Surgeon: Burnell Blanks, MD;  Location: [facility];  Service: Cardiovascular;  Laterality: N/A;  . CARDIAC CATHETERIZATION N/A  06/09/2016   Procedure: Coronary Stent Intervention;  Surgeon: Burnell Blanks, MD;  Location: [facility];  Service: Cardiovascular;  Laterality: N/A;  . LASER ABLATION Right    leg  . phlebectomy Right    leg  . SHOULDER SURGERY      Family History  Problem Relation Age of Onset  . Cancer Mother   . Hyperlipidemia Mother   . Cancer Father   . Hyperlipidemia Father   . AAA (abdominal aortic aneurysm) Father     Social History   Tobacco Use  . Smoking status: Never Smoker  . Smokeless tobacco: Never Used  Substance Use Topics  . Alcohol use: No    ---------------------------------------------------------------------------------------------------------------------  Scheduled Meds: Continuous Infusions: PRN Meds:.   BP (!) 144/92   Pulse 97   Temp 97.9 F (36.6 C) (Oral)   Resp 16   Ht 5\' 9"  (1.753 m)   Wt 248 lb (112.5 kg)   SpO2 95%   BMI 36.62 kg/m    BP Readings from Last 3 Encounters:  10/12/17 (!) 144/92  10/04/17 136/87  03/15/17 135/88     Wt Readings from Last 3 Encounters:  10/12/17 248 lb (112.5 kg)  10/04/17 245 lb (111.1 kg)  03/15/17 245 lb (111.1 kg)     ----------------------------------------------------------------------------------------------------------------------  ROS Review of Systems CNS: No confusion or sedation Cardiac: No angina or palpitations GI: No constipation or abdominal pain Constitutional no other lower extremity weakness or upper extremity involvement  Objective:  BP (!) 144/92   Pulse 97   Temp 97.9 F (36.6 C) (Oral)   Resp 16   Ht 5\' 9"  (1.753 m)   Wt 248 lb (112.5 kg)   SpO2 95%   BMI 36.62 kg/m   Physical Exam Patient is alert oriented cooperative compliant Pupils are equally round reactive to light with extraocular muscles intact Heart is regular rate and rhythm without murmur Lungs are clear to auscultation Inspection of the low back reveals some paraspinous muscle tenderness  on the left but 3 apparent trigger points in the right overlying the right L5 facet and approaching the right SI joint.  These do reproduce the quality of pain that he is been describing.  His strength in the lower extremities is intact at 5/5 both proximal and distal with good muscle tone and bulk.     Assessment & Plan:   Abdulrahman was seen today for hip pain.  Diagnoses and all orders for this visit:  Sacroiliac joint disease  Chronic right-sided low back pain without sciatica  Other orders -     dexamethasone (DECADRON) injection 10 mg -     ropivacaine (PF) 2 mg/mL (0.2%) (NAROPIN) injection 16 mL     ----------------------------------------------------------------------------------------------------------------------  Problem List Items Addressed This Visit    None    Visit Diagnoses    Sacroiliac joint disease    -  Primary   Chronic right-sided low back pain without sciatica       Relevant Medications   dexamethasone (DECADRON) injection 10 mg (Completed)      ----------------------------------------------------------------------------------------------------------------------  1. Sacroiliac joint disease Mr. Mcnay has some findings consistent with sacroiliac disease however prior to initiating an SI joint injection I would like to address the trigger point issue with the low back and see if this may be of benefit for him.  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  Furthermore he has minimal risk factors for healing.  At present secondary to the trigger points we will proceed with a myoneural block today and have him return to clinic in 1 week for reevaluation possible repeat injection at that time and hopefully he can achieve some pain relief with stretching that can help facilitate healing of these problematic areas.  Pending his response to this initial  therapy we may consider a sacroiliac injection as described to him today.  2. Chronic right-sided low back pain without sciatica As above    ----------------------------------------------------------------------------------------------------------------------  I am having Lavell Luster maintain his aspirin EC, calcitRIOL, metoprolol tartrate, liothyronine, ramipril, ALPRAZolam, nefazodone, rosuvastatin, sertraline, testosterone cypionate, and torsemide. We administered dexamethasone and ropivacaine (PF) 2 mg/mL (0.2%).   Meds ordered this encounter  Medications  . dexamethasone (DECADRON) injection 10 mg  . ropivacaine (PF) 2 mg/mL (0.2%) (NAROPIN) injection 16 mL       Follow-up: Return in about 1 week (around 10/19/2017) for procedure.  Procedure: Trigger point injection x4 to the right L5 and SI region  Trigger  point injection: The area overlying the aforementioned trigger points were prepped with alcohol. They were then injected with a 25-gauge needle with 4 cc of ropivacaine 0.2% and Decadron 4 mg at each site times a total of 4 after negative aspiration for heme. This was performed after informed consent was obtained and risks and benefits reviewed. She tolerated this procedure without difficulty and was convalesced and discharged to home in stable condition for follow-up as mentioned.  @Yorel Redder, MD@  Molli Barrows, MD 9:02 AM  The [facility] practitioner database for opioid medications on this patient has been reviewed by me and my staff   Greater than 50% of the total encounter time was spent in counseling and / or coordination of care.     This dictation was performed utilizing Systems analyst.  Please excuse any unintentional or mistaken typographical errors as a result.

## 2017-10-13 NOTE — Telephone Encounter (Signed)
Post procedue phone call..  Left message.

## 2017-10-17 ENCOUNTER — Other Ambulatory Visit: Payer: Self-pay | Admitting: Anesthesiology

## 2017-10-17 ENCOUNTER — Encounter: Payer: Self-pay | Admitting: Anesthesiology

## 2017-10-17 ENCOUNTER — Ambulatory Visit (HOSPITAL_BASED_OUTPATIENT_CLINIC_OR_DEPARTMENT_OTHER): Payer: BLUE CROSS/BLUE SHIELD | Admitting: Anesthesiology

## 2017-10-17 ENCOUNTER — Ambulatory Visit
Admission: RE | Admit: 2017-10-17 | Discharge: 2017-10-17 | Disposition: A | Discharge status: Home / Self Care | Payer: BLUE CROSS/BLUE SHIELD | Source: Ambulatory Visit | Attending: Anesthesiology | Admitting: Anesthesiology

## 2017-10-17 VITALS — BP 126/94 | HR 89 | Temp 97.9°F | Resp 16 | Ht 69.0 in | Wt 245.0 lb

## 2017-10-17 DIAGNOSIS — G8929 Other chronic pain: Secondary | ICD-10-CM | POA: Diagnosis not present

## 2017-10-17 DIAGNOSIS — R52 Pain, unspecified: Secondary | ICD-10-CM

## 2017-10-17 DIAGNOSIS — Z79899 Other long term (current) drug therapy: Secondary | ICD-10-CM | POA: Diagnosis not present

## 2017-10-17 DIAGNOSIS — M5136 Other intervertebral disc degeneration, lumbar region: Secondary | ICD-10-CM

## 2017-10-17 DIAGNOSIS — M533 Sacrococcygeal disorders, not elsewhere classified: Secondary | ICD-10-CM

## 2017-10-17 DIAGNOSIS — M51369 Other intervertebral disc degeneration, lumbar region without mention of lumbar back pain or lower extremity pain: Secondary | ICD-10-CM

## 2017-10-17 DIAGNOSIS — M545 Low back pain, unspecified: Secondary | ICD-10-CM

## 2017-10-17 MED ORDER — MIDAZOLAM HCL 5 MG/5ML IJ SOLN
5.0000 mg | Freq: Once | INTRAMUSCULAR | Status: AC
  Administered 2017-10-17: 4 mg via INTRAVENOUS
  Filled 2017-10-17: qty 5

## 2017-10-17 MED ORDER — LACTATED RINGERS IV SOLN
1000.0000 mL | INTRAVENOUS | Status: DC
  Administered 2017-10-17: 1000 mL via INTRAVENOUS

## 2017-10-17 MED ORDER — IOPAMIDOL (ISOVUE-M 200) INJECTION 41%: 20.0000 mL | Freq: Once | INTRAMUSCULAR | Status: DC | PRN

## 2017-10-17 MED ORDER — IOPAMIDOL (ISOVUE-M 200) INJECTION 41%
INTRAMUSCULAR | Status: AC
  Filled 2017-10-17: qty 10

## 2017-10-17 MED ORDER — LIDOCAINE HCL (PF) 1 % IJ SOLN
5.0000 mL | Freq: Once | INTRAMUSCULAR | Status: AC
  Administered 2017-10-17: 5 mL via SUBCUTANEOUS
  Filled 2017-10-17: qty 5

## 2017-10-17 MED ORDER — ROPIVACAINE HCL 2 MG/ML IJ SOLN
10.0000 mL | Freq: Once | INTRAMUSCULAR | Status: AC
  Administered 2017-10-17: 10 mL via EPIDURAL
  Filled 2017-10-17: qty 10

## 2017-10-17 MED ORDER — TRIAMCINOLONE ACETONIDE 40 MG/ML IJ SUSP
40.0000 mg | Freq: Once | INTRAMUSCULAR | Status: AC
  Administered 2017-10-17: 40 mg
  Filled 2017-10-17: qty 1

## 2017-10-17 NOTE — Progress Notes (Signed)
Safety precautions to be maintained throughout the outpatient stay will include: orient to surroundings, keep bed in low position, maintain call bell within reach at all times, provide assistance with transfer out of bed and ambulation.  

## 2017-10-17 NOTE — Patient Instructions (Signed)

## 2017-10-17 NOTE — Progress Notes (Signed)
Subjective:  Patient ID: Dylan Chandler, male    DOB: 1966/10/17  Age: 51 y.o. MRN: [id number]  CC: Back Pain (right SI joint)   Procedure: Right SI joint injection under fluoroscopic guidance with moderate sedation  HPI Deny [name] presents for reevaluation today.  He was last seen a few days ago and had a trigger point injection to the right sacroiliac area and had some transient and immediate improvement in his spasming but has continued to have pain radiating from the right SI region into the right groin and right lateral hip.  He rarely has some unusual symptoms or sensation with mild cramping involving the right calf without reported weakness or problems with bowel or bladder dysfunction.  Otherwise he has been in his usual state of health.  Outpatient Medications Prior to Visit  Medication Sig Dispense Refill  . ALPRAZolam (XANAX) 1 MG tablet Take 1 tablet (1 mg total) by mouth 2 (two) times daily. 60 tablet 5  . calcitRIOL (ROCALTROL) 0.25 MCG capsule   5  . liothyronine (CYTOMEL) 25 MCG tablet TAKE 3 TABLETS (75 MCG TOTAL) BY MOUTH DAILY. 90 tablet 5  . nefazodone (SERZONE) 150 MG tablet Take 1 tablet (150 mg total) by mouth 2 (two) times daily. 180 tablet 4  . ramipril (ALTACE) 10 MG capsule Take 1 capsule (10 mg total) by mouth 2 (two) times daily. 180 capsule 3  . rosuvastatin (CRESTOR) 5 MG tablet Take 1 tablet (5 mg total) by mouth daily. 90 tablet 4  . sertraline (ZOLOFT) 100 MG tablet Take 2 tablets (200 mg total) by mouth 2 (two) times daily. 360 tablet 4  . testosterone cypionate (DEPOTESTOSTERONE CYPIONATE) 200 MG/ML injection Inject 0.5 mLs (100 mg total) into the muscle once a week. 10 mL 0  . aspirin EC 325 MG tablet Take 325 mg by mouth daily.    . metoprolol tartrate (LOPRESSOR) 25 MG tablet Take 25 mg by mouth 2 (two) times daily.    Marland Kitchen torsemide (DEMADEX) 20 MG tablet Take 1 tablet (20 mg total) by mouth daily. (Patient not taking: Reported on 10/17/2017) 30  tablet 7   No facility-administered medications prior to visit.     Review of Systems CNS: No sedation or confusion Cardiac: No angina or palpitations GI: No constipation or abdominal pain  Objective:  BP 134/71   Pulse 89   Temp 97.9 F (36.6 C) (Oral)   Resp (!) 21   Ht 5\' 9"  (1.753 m)   Wt 245 lb (111.1 kg)   SpO2 97%   BMI 36.18 kg/m    BP Readings from Last 3 Encounters:  10/17/17 134/71  10/12/17 (!) 144/92  10/04/17 136/87     Wt Readings from Last 3 Encounters:  10/17/17 245 lb (111.1 kg)  10/12/17 248 lb (112.5 kg)  10/04/17 245 lb (111.1 kg)     Physical Exam Pt is alert and oriented PERRL EOMI HEART IS RRR no murmur or rub LCTA no wheezing or rhales MUSCULOSKELETAL he has tenderness over the right SI joint and low lumbar region right side paraspinous muscles.  He has good flexibility to flexion extension at the low back without provocative pain.  He has no significant pain on extension with right lateral rotation.  His muscle tone and bulk is good with excellent strength throughout the lower extremities.  Labs  Lab Results  Component Value Date   HGBA1C 5.3 06/11/2016   HGBA1C 5.4 06/09/2016   Lab Results  Component Value  Date   LDLCALC 163 (H) 10/04/2017   CREATININE 4.72 (H) 10/04/2017    -------------------------------------------------------------------------------------------------------------------- Lab Results  Component Value Date   WBC 7.4 10/04/2017   HGB 14.9 10/04/2017   HCT 46.0 10/04/2017   PLT 184 10/04/2017   GLUCOSE 84 10/04/2017   CHOL 230 (H) 10/04/2017   TRIG 192 (H) 10/04/2017   HDL 29 (L) 10/04/2017   LDLCALC 163 (H) 10/04/2017   ALT 50 (H) 10/04/2017   AST 41 (H) 10/04/2017   NA 140 10/04/2017   K 5.2 10/04/2017   CL 102 10/04/2017   CREATININE 4.72 (H) 10/04/2017   BUN 87 (HH) 10/04/2017   CO2 20 10/04/2017   TSH 0.428 (L) 10/04/2017   INR 2.19 06/09/2016   HGBA1C 5.3 06/11/2016     --------------------------------------------------------------------------------------------------------------------- No results found.   Assessment & Plan:   Lora was seen today for back pain.  Diagnoses and all orders for this visit:  Sacroiliac joint disease -     PR INJECT FOR SACROILIAC JOINT  Chronic right-sided low back pain without sciatica  DDD (degenerative disc disease), lumbar  Other orders -     triamcinolone acetonide (KENALOG-40) injection 40 mg -     midazolam (VERSED) 5 MG/5ML injection 5 mg -     ropivacaine (PF) 2 mg/mL (0.2%) (NAROPIN) injection 10 mL -     lidocaine (PF) (XYLOCAINE) 1 % injection 5 mL -     lactated ringers infusion 1,000 mL -     iopamidol (ISOVUE-M) 41 % intrathecal injection 20 mL        ----------------------------------------------------------------------------------------------------------------------  Problem List Items Addressed This Visit    None    Visit Diagnoses    Sacroiliac joint disease    -  Primary   Relevant Orders   PR INJECT FOR SACROILIAC JOINT   Chronic right-sided low back pain without sciatica       Relevant Medications   triamcinolone acetonide (KENALOG-40) injection 40 mg (Completed)   DDD (degenerative disc disease), lumbar       Relevant Medications   triamcinolone acetonide (KENALOG-40) injection 40 mg (Completed)        ----------------------------------------------------------------------------------------------------------------------  1. Sacroiliac joint disease We will proceed with a diagnostic right side SI joint injection today.  We have gone over the risks and benefits with him in full detail all questions answered.  He requests that we do not use contrast media secondary to some concerns for renal impairment - PR INJECT FOR SACROILIAC JOINT  2. Chronic right-sided low back pain without sciatica Continue efforts at stretching strengthening.  We have talked about return to clinic in  1 month for reevaluation with repeat SI injection.  If he fails this therapy we may consider a lumbar epidural steroid secondary to some of the radicular symptoms he is experiencing.  3. DDD (degenerative disc disease), lumbar Continue with current core strengthening.    ----------------------------------------------------------------------------------------------------------------------  I am having Lavell Luster maintain his aspirin EC, calcitRIOL, metoprolol tartrate, liothyronine, ramipril, ALPRAZolam, nefazodone, rosuvastatin, sertraline, testosterone cypionate, and torsemide. We administered triamcinolone acetonide, midazolam, ropivacaine (PF) 2 mg/mL (0.2%), lidocaine (PF), and lactated ringers.   Meds ordered this encounter  Medications  . triamcinolone acetonide (KENALOG-40) injection 40 mg  . midazolam (VERSED) 5 MG/5ML injection 5 mg  . ropivacaine (PF) 2 mg/mL (0.2%) (NAROPIN) injection 10 mL  . lidocaine (PF) (XYLOCAINE) 1 % injection 5 mL  . lactated ringers infusion 1,000 mL  . iopamidol (ISOVUE-M) 41 % intrathecal injection 20  mL     Medication List        Accurate as of 10/17/17  2:09 PM. Always use your most recent med list.          ALPRAZolam 1 MG tablet Commonly known as:  XANAX Take 1 tablet (1 mg total) by mouth 2 (two) times daily.   aspirin EC 325 MG tablet   calcitRIOL 0.25 MCG capsule Commonly known as:  ROCALTROL   liothyronine 25 MCG tablet Commonly known as:  CYTOMEL TAKE 3 TABLETS (75 MCG TOTAL) BY MOUTH DAILY.   metoprolol tartrate 25 MG tablet Commonly known as:  LOPRESSOR   nefazodone 150 MG tablet Commonly known as:  SERZONE Take 1 tablet (150 mg total) by mouth 2 (two) times daily.   ramipril 10 MG capsule Commonly known as:  ALTACE Take 1 capsule (10 mg total) by mouth 2 (two) times daily.   rosuvastatin 5 MG tablet Commonly known as:  CRESTOR Take 1 tablet (5 mg total) by mouth daily.   sertraline 100 MG  tablet Commonly known as:  ZOLOFT Take 2 tablets (200 mg total) by mouth 2 (two) times daily.   testosterone cypionate 200 MG/ML injection Commonly known as:  DEPOTESTOSTERONE CYPIONATE Inject 0.5 mLs (100 mg total) into the muscle once a week.   torsemide 20 MG tablet Commonly known as:  DEMADEX Take 1 tablet (20 mg total) by mouth daily.      ----------------------------------------------------------------------------------------------------------------------  Follow-up: Return in about 1 month (around 11/17/2017) for evaluation, procedure.  Procedure: SI joint injection under fluoroscopic guidance with moderate sedation  The area overlying the right SI region was prepped sterilely with Betadine x3.  We used oblique fluoroscopic guidance to open up the sacroiliac joint.  At the caudal portion of the joint and in tunnel-type fluoroscopic guidance I then injected 1% lidocaine overlying the cutaneous marking.  5 cc of 1.5% lidocaine was infiltrated subcu and to the fascia.  Then a 22-gauge 3-1/2 inch Quincke needle was advanced in the usual tunnel type fluoroscopic field with a needle tip advanced approximately 4 mm into the joint.  Negative aspiration for heme or CSF and an injection of 2 cc of ropivacaine 0.2% mixed with 40 mg of triamcinolone was incrementally injected without undue pressure.  No evidence of IV or subarachnoid symptoms were experienced and the patient was convalesced discharged home in stable condition for follow-up as mentioned.  Molli Barrows, MD

## 2017-10-19 ENCOUNTER — Encounter: Payer: Self-pay | Admitting: Family Medicine

## 2017-10-19 ENCOUNTER — Ambulatory Visit: Payer: BLUE CROSS/BLUE SHIELD | Admitting: Anesthesiology

## 2017-12-19 ENCOUNTER — Other Ambulatory Visit: Payer: Self-pay | Admitting: Adult Health

## 2017-12-19 NOTE — Telephone Encounter (Signed)
REFILL 

## 2018-01-09 ENCOUNTER — Encounter: Payer: Self-pay | Admitting: Family Medicine

## 2018-01-17 ENCOUNTER — Other Ambulatory Visit: Payer: Self-pay | Admitting: Family Medicine

## 2018-01-17 DIAGNOSIS — E230 Hypopituitarism: Secondary | ICD-10-CM

## 2018-01-17 NOTE — Telephone Encounter (Signed)
LOV 09-25-17 with Dr. Jeananne Rama / Refill request for Zoloft and Testosterone / Will route to provider for approval

## 2018-01-18 NOTE — Telephone Encounter (Signed)
Needs to come get his testosterone drawn before 10 AM

## 2018-01-19 NOTE — Telephone Encounter (Signed)
Left message on machine for pt to return call to the office. CRM created.  

## 2018-01-22 NOTE — Telephone Encounter (Signed)
Left message on machine for pt to return call to the office.  

## 2018-01-24 NOTE — Telephone Encounter (Signed)
Pt also expressed concern about getting Xanax refilled since his appointment was moved from May to June and he will run out by then. Pt worried that refill will need done while Jeananne Rama is out of town and would like this to be addressed sooner than later. Pt aware provider not back til Tuesday. Okay to wait until then for response.

## 2018-01-24 NOTE — Telephone Encounter (Signed)
Spoke with patient. He will come in Next Wednesday to have drawn.

## 2018-01-24 NOTE — Telephone Encounter (Signed)
Left message on machine for pt to return call to the office.  

## 2018-01-24 NOTE — Telephone Encounter (Signed)
OK to wait for Dr. Rance Muir return on Tuesday.

## 2018-01-31 ENCOUNTER — Other Ambulatory Visit: Payer: BLUE CROSS/BLUE SHIELD

## 2018-01-31 DIAGNOSIS — E230 Hypopituitarism: Secondary | ICD-10-CM

## 2018-02-01 ENCOUNTER — Encounter: Payer: Self-pay | Admitting: Family Medicine

## 2018-02-01 LAB — TESTOSTERONE: Testosterone: 593 ng/dL (ref 264–916)

## 2018-02-24 ENCOUNTER — Other Ambulatory Visit: Payer: Self-pay | Admitting: Family Medicine

## 2018-02-24 DIAGNOSIS — E230 Hypopituitarism: Secondary | ICD-10-CM

## 2018-02-26 NOTE — Telephone Encounter (Signed)
Liothyronine 25 mcg tablet refill request  LOV 10/04/17 with Dr. Jeananne Rama  [facility] [address] [place], [place] - [phone]

## 2018-03-28 ENCOUNTER — Other Ambulatory Visit: Payer: Self-pay | Admitting: Family Medicine

## 2018-03-28 DIAGNOSIS — E230 Hypopituitarism: Secondary | ICD-10-CM

## 2018-03-28 NOTE — Telephone Encounter (Signed)
LOV  10/04/17 Dr. Jeananne Rama Last refill 02/26/18  # 90 with 0 refill

## 2018-04-04 ENCOUNTER — Ambulatory Visit: Payer: BLUE CROSS/BLUE SHIELD | Admitting: Family Medicine

## 2018-04-23 ENCOUNTER — Ambulatory Visit (INDEPENDENT_AMBULATORY_CARE_PROVIDER_SITE_OTHER): Payer: BLUE CROSS/BLUE SHIELD | Admitting: Family Medicine

## 2018-04-23 ENCOUNTER — Encounter: Payer: Self-pay | Admitting: Family Medicine

## 2018-04-23 VITALS — BP 136/83 | HR 88 | Ht 67.0 in | Wt 245.0 lb

## 2018-04-23 DIAGNOSIS — F329 Major depressive disorder, single episode, unspecified: Secondary | ICD-10-CM | POA: Diagnosis not present

## 2018-04-23 DIAGNOSIS — E782 Mixed hyperlipidemia: Secondary | ICD-10-CM | POA: Diagnosis not present

## 2018-04-23 DIAGNOSIS — E230 Hypopituitarism: Secondary | ICD-10-CM | POA: Diagnosis not present

## 2018-04-23 DIAGNOSIS — F32A Depression, unspecified: Secondary | ICD-10-CM

## 2018-04-23 DIAGNOSIS — I10 Essential (primary) hypertension: Secondary | ICD-10-CM | POA: Diagnosis not present

## 2018-04-23 DIAGNOSIS — I129 Hypertensive chronic kidney disease with stage 1 through stage 4 chronic kidney disease, or unspecified chronic kidney disease: Secondary | ICD-10-CM

## 2018-04-23 LAB — LP+ALT+AST PICCOLO, WAIVED
ALT (SGPT) PICCOLO, WAIVED: 45 U/L (ref 10–47)
AST (SGOT) PICCOLO, WAIVED: 29 U/L (ref 11–38)
Chol/HDL Ratio Piccolo,Waive: 4.2 mg/dL
Cholesterol Piccolo, Waived: 171 mg/dL (ref <200)
HDL Chol Piccolo, Waived: 41 mg/dL — ABNORMAL LOW (ref >59)
LDL Chol Calc Piccolo Waived: 95 mg/dL (ref <100)
TRIGLYCERIDES PICCOLO,WAIVED: 175 mg/dL — AB (ref <150)
VLDL Chol Calc Piccolo,Waive: 35 mg/dL — ABNORMAL HIGH (ref <30)

## 2018-04-23 MED ORDER — ALPRAZOLAM 1 MG PO TABS: 1.0000 mg | ORAL_TABLET | Freq: Two times a day (BID) | ORAL | 5 refills | Status: DC

## 2018-04-23 MED ORDER — SERTRALINE HCL 100 MG PO TABS: 200.0000 mg | ORAL_TABLET | Freq: Two times a day (BID) | ORAL | 1 refills | Status: DC

## 2018-04-23 NOTE — Assessment & Plan Note (Signed)
The current medical regimen is effective;  continue present plan and medications.  

## 2018-04-23 NOTE — Progress Notes (Signed)
BP 136/83   Pulse 88   Ht 5\' 7"  (1.702 m)   Wt 245 lb (111.1 kg)   SpO2 97%   BMI 38.37 kg/m    Subjective:    Patient ID: Dylan Chandler, male    DOB: 07/16/1966, 52 y.o.   MRN: [id number]  HPI: Dylan Chandler is a 52 y.o. male  Chief Complaint  Patient presents with  . Follow-up  Patient follow-up multiple conditions hypertension doing well no complaints taking furosemide on a as needed basis which really is very rarely for fluid retention. Does take ramipril 10 mg 2 tablets every day. For cholesterol taking Crestor 5 mg every other day without problems or myalgias. Taking testosterone 100 mg every 7 days without issues. Chronic kidney disease stage IV has not been back to nephrology discuss follow-up patient will go ahead and make these appointments.   Relevant past medical, surgical, family and social history reviewed and updated as indicated. Interim medical history since our last visit reviewed. Allergies and medications reviewed and updated.  Review of Systems  Constitutional: Negative.   Respiratory: Negative.   Cardiovascular: Negative.     Per HPI unless specifically indicated above     Objective:    BP 136/83   Pulse 88   Ht 5\' 7"  (1.702 m)   Wt 245 lb (111.1 kg)   SpO2 97%   BMI 38.37 kg/m   Wt Readings from Last 3 Encounters:  04/23/18 245 lb (111.1 kg)  10/17/17 245 lb (111.1 kg)  10/12/17 248 lb (112.5 kg)    Physical Exam  Constitutional: He is oriented to person, place, and time. He appears well-developed and well-nourished.  HENT:  Head: Normocephalic and atraumatic.  Eyes: Conjunctivae and EOM are normal.  Neck: Normal range of motion.  Cardiovascular: Normal rate, regular rhythm and normal heart sounds.  Pulmonary/Chest: Effort normal and breath sounds normal.  Musculoskeletal: Normal range of motion.  Neurological: He is alert and oriented to person, place, and time.  Skin: No erythema.  Psychiatric: He has a normal mood and  affect. His behavior is normal. Judgment and thought content normal.    Results for orders placed or performed in visit on 01/31/18  Testosterone  Result Value Ref Range   Testosterone 593 264 - 916 ng/dL      Assessment & Plan:   Problem List Items Addressed This Visit      Cardiovascular and Mediastinum   Hypertension - Primary    The current medical regimen is effective;  continue present plan and medications.       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       Relevant Orders   Testosterone, free, total([facility])     Genitourinary   Hypertensive chronic kidney disease with stage 1 through stage 4 chronic kidney disease, or unspecified chronic kidney disease    Labs pending  Will make follow-up appointment with you nephrology        Other   Hyperlipidemia    The current medical regimen is effective;  continue present plan and medications.       Relevant Orders   Basic metabolic panel   LP+ALT+AST Piccolo, Waived   Depression    The current medical regimen is effective;  continue present plan and medications.  Relevant Medications   ALPRAZolam (XANAX) 1 MG tablet   sertraline (ZOLOFT) 100 MG tablet       Follow up plan: Return in about 6 months (around 10/23/2018) for Physical Exam, Hemoglobin A1c.

## 2018-04-23 NOTE — Assessment & Plan Note (Signed)
Labs pending  Will make follow-up appointment with you nephrology

## 2018-04-24 ENCOUNTER — Other Ambulatory Visit: Payer: Self-pay | Admitting: Family Medicine

## 2018-04-24 DIAGNOSIS — F32A Depression, unspecified: Secondary | ICD-10-CM

## 2018-04-24 DIAGNOSIS — F329 Major depressive disorder, single episode, unspecified: Secondary | ICD-10-CM

## 2018-04-24 LAB — BASIC METABOLIC PANEL
BUN/Creatinine Ratio: 14 (ref 9–20)
BUN: 78 mg/dL — AB (ref 6–24)
CALCIUM: 8.6 mg/dL — AB (ref 8.7–10.2)
CHLORIDE: 105 mmol/L (ref 96–106)
CO2: 19 mmol/L — AB (ref 20–29)
Creatinine, Ser: 5.7 mg/dL — ABNORMAL HIGH (ref 0.76–1.27)
GFR calc Af Amer: 12 mL/min/{1.73_m2} — ABNORMAL LOW (ref >59)
GFR calc non Af Amer: 11 mL/min/{1.73_m2} — ABNORMAL LOW (ref >59)
GLUCOSE: 93 mg/dL (ref 65–99)
Potassium: 4.6 mmol/L (ref 3.5–5.2)
Sodium: 139 mmol/L (ref 134–144)

## 2018-04-24 LAB — TESTOSTERONE, FREE, TOTAL, SHBG
SEX HORMONE BINDING: 52.7 nmol/L (ref 19.3–76.4)
TESTOSTERONE: 1189 ng/dL — AB (ref 264–916)
Testosterone, Free: 31.9 pg/mL — ABNORMAL HIGH (ref 7.2–24.0)

## 2018-05-01 ENCOUNTER — Encounter: Payer: Self-pay | Admitting: Family Medicine

## 2018-05-07 ENCOUNTER — Other Ambulatory Visit: Payer: Self-pay | Admitting: Family Medicine

## 2018-05-07 DIAGNOSIS — N185 Chronic kidney disease, stage 5: Principal | ICD-10-CM

## 2018-05-07 DIAGNOSIS — I120 Hypertensive chronic kidney disease with stage 5 chronic kidney disease or end stage renal disease: Secondary | ICD-10-CM

## 2018-05-12 ENCOUNTER — Other Ambulatory Visit: Payer: Self-pay | Admitting: Family Medicine

## 2018-05-12 DIAGNOSIS — E230 Hypopituitarism: Secondary | ICD-10-CM

## 2018-05-14 NOTE — Telephone Encounter (Signed)
Testosterone cypionate refill Last Refill:01/29/18  Last OV: 04/23/18 PCP: Dr. Jeananne Rama [facility]acy[facility] [place]

## 2018-05-16 ENCOUNTER — Other Ambulatory Visit: Payer: Self-pay

## 2018-05-16 DIAGNOSIS — N185 Chronic kidney disease, stage 5: Secondary | ICD-10-CM

## 2018-05-16 DIAGNOSIS — Z01812 Encounter for preprocedural laboratory examination: Secondary | ICD-10-CM

## 2018-05-22 ENCOUNTER — Other Ambulatory Visit: Payer: Self-pay | Admitting: Unknown Physician Specialty

## 2018-05-22 DIAGNOSIS — E230 Hypopituitarism: Secondary | ICD-10-CM

## 2018-05-22 NOTE — Telephone Encounter (Signed)
Refill request for Liothyronine; last refill 03/29/18; #90; no refills  Last office visit : 04/23/18  PCP: Dr. Jeananne Rama  Pharmacy: [facility] [place], [place]

## 2018-06-19 ENCOUNTER — Other Ambulatory Visit: Payer: Self-pay | Admitting: Family Medicine

## 2018-06-19 DIAGNOSIS — E230 Hypopituitarism: Secondary | ICD-10-CM

## 2018-06-19 NOTE — Telephone Encounter (Signed)
Liothyronine (Cytomel) refill Last Refill:05/22/18 #90 Last OV: 04/23/18 PCP: Dr. Jeananne Rama

## 2018-07-18 ENCOUNTER — Other Ambulatory Visit: Payer: Self-pay | Admitting: Family Medicine

## 2018-07-18 DIAGNOSIS — E230 Hypopituitarism: Secondary | ICD-10-CM

## 2018-07-25 ENCOUNTER — Other Ambulatory Visit: Payer: Self-pay

## 2018-07-25 ENCOUNTER — Ambulatory Visit (HOSPITAL_COMMUNITY)
Admission: RE | Admit: 2018-07-25 | Discharge: 2018-07-25 | Disposition: A | Discharge status: Home / Self Care | Payer: BLUE CROSS/BLUE SHIELD | Source: Ambulatory Visit | Attending: Vascular Surgery | Admitting: Vascular Surgery

## 2018-07-25 ENCOUNTER — Encounter: Payer: Self-pay | Admitting: Vascular Surgery

## 2018-07-25 ENCOUNTER — Ambulatory Visit (INDEPENDENT_AMBULATORY_CARE_PROVIDER_SITE_OTHER)
Admission: RE | Admit: 2018-07-25 | Discharge: 2018-07-25 | Disposition: A | Discharge status: Home / Self Care | Payer: BLUE CROSS/BLUE SHIELD | Source: Ambulatory Visit | Attending: Vascular Surgery | Admitting: Vascular Surgery

## 2018-07-25 ENCOUNTER — Other Ambulatory Visit: Payer: Self-pay | Admitting: *Deleted

## 2018-07-25 ENCOUNTER — Ambulatory Visit (INDEPENDENT_AMBULATORY_CARE_PROVIDER_SITE_OTHER): Payer: BLUE CROSS/BLUE SHIELD | Admitting: Vascular Surgery

## 2018-07-25 ENCOUNTER — Encounter: Payer: Self-pay | Admitting: *Deleted

## 2018-07-25 VITALS — BP 142/84 | HR 90 | Resp 20 | Ht 67.0 in | Wt 235.0 lb

## 2018-07-25 DIAGNOSIS — N185 Chronic kidney disease, stage 5: Secondary | ICD-10-CM | POA: Insufficient documentation

## 2018-07-25 DIAGNOSIS — I120 Hypertensive chronic kidney disease with stage 5 chronic kidney disease or end stage renal disease: Secondary | ICD-10-CM | POA: Insufficient documentation

## 2018-07-25 DIAGNOSIS — I2510 Atherosclerotic heart disease of native coronary artery without angina pectoris: Secondary | ICD-10-CM | POA: Diagnosis not present

## 2018-07-25 DIAGNOSIS — Z01812 Encounter for preprocedural laboratory examination: Secondary | ICD-10-CM | POA: Diagnosis not present

## 2018-07-25 DIAGNOSIS — E785 Hyperlipidemia, unspecified: Secondary | ICD-10-CM | POA: Diagnosis not present

## 2018-07-25 DIAGNOSIS — Z01810 Encounter for preprocedural cardiovascular examination: Secondary | ICD-10-CM | POA: Diagnosis not present

## 2018-07-25 DIAGNOSIS — N184 Chronic kidney disease, stage 4 (severe): Secondary | ICD-10-CM

## 2018-07-25 NOTE — H&P (View-Only) (Signed)
REASON FOR CONSULT:    To evaluate for hemodialysis access.  The patient was referred by Dr. Jimmy Footman.  HPI:   Dylan Chandler is a pleasant 52 y.o. male, who was referred for evaluation for hemodialysis access.  He is left-handed.  He would like to do home dialysis so would like to have access in his right arm so that he can use his left arm to cannulate the fistula.  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  He denies any recent uremic symptoms.  Specifically, he denies nausea, vomiting, fatigue, anorexia, or palpitations.  He does a lot of heavy weightlifting.  I have reviewed the records from the referring office.  The patient has stage V chronic kidney disease.  His last GFR was 10.  Past Medical History:  Diagnosis Date  . CAD (coronary artery disease)   . Cardiac murmur   . Chronic kidney disease, stage III (moderate) (HCC)   . Depression   . Elevated SGOT   . Hyperlipidemia   . Hypertension   . 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 Myocardial infarction ([place])   . Sleep apnea   . Thyroid disease     Family History  Problem Relation Age of Onset  . Cancer Mother   . Hyperlipidemia Mother   . Cancer Father   . Hyperlipidemia Father   . AAA (abdominal aortic aneurysm) Father     SOCIAL HISTORY: Social History   Socioeconomic History  . Marital status: Married    Spouse name: Not on file  . Number of children: Not on file  . Years of education: Not on file  . Highest education level: Not on file  Occupational History  . Not on file  Social Needs  . Financial resource strain: Not hard at all  . Food insecurity:    Worry: Patient refused    Inability: Patient refused  . Transportation needs:    Medical: Patient refused    Non-medical: Patient refused  Tobacco Use  . Smoking status: Never Smoker  . Smokeless tobacco: Never Used  Substance and Sexual Activity  . Alcohol use: No  . Drug use: No  . Sexual activity: Not on file    Lifestyle  . Physical activity:    Days per week: 5 days    Minutes per session: 60 min  . Stress: Not at all  Relationships  . Social connections:    Talks on phone: Patient refused    Gets together: Patient refused    Attends religious service: Patient refused    Active member of club or organization: Patient refused    Attends meetings of clubs or organizations: Patient refused    Relationship status: Patient refused  . Intimate partner violence:    Fear of current or ex partner: Patient refused    Emotionally abused: Patient refused    Physically abused: Patient refused    Forced sexual activity: Patient refused  Other Topics Concern  . Not on file  Social History Narrative  . Not on file    Allergies  Allergen Reactions  . Amlodipine Swelling  . Metoprolol Tartrate Other (See Comments)    fatague    Current Outpatient Medications  Medication Sig Dispense Refill  . ALPRAZolam (XANAX) 1 MG tablet Take 1 tablet (1 mg total) by mouth 2 (two) times daily. 60 tablet 5  . aspirin EC 325 MG tablet Take 325 mg by mouth daily.    . calcitRIOL (ROCALTROL) 0.25 MCG capsule  5  . liothyronine (CYTOMEL) 25 MCG tablet TAKE 3 TABLETS BY MOUTH EVERY DAY 90 tablet 2  . nefazodone (SERZONE) 150 MG tablet Take 1 tablet (150 mg total) by mouth 2 (two) times daily. 180 tablet 4  . nitroGLYCERIN (NITROSTAT) 0.4 MG SL tablet PLACE ONE TABLET UNDER THE TONGUE AS NEEDED FOR CHEST PAIN 25 tablet 6  . ramipril (ALTACE) 10 MG capsule Take 1 capsule (10 mg total) by mouth 2 (two) times daily. 180 capsule 3  . rosuvastatin (CRESTOR) 5 MG tablet Take 1 tablet (5 mg total) by mouth daily. 90 tablet 4  . sertraline (ZOLOFT) 100 MG tablet Take 2 tablets (200 mg total) by mouth 2 (two) times daily. 360 tablet 4  . sertraline (ZOLOFT) 100 MG tablet Take 2 tablets (200 mg total) by mouth 2 (two) times daily. 360 tablet 1  . testosterone cypionate (DEPOTESTOSTERONE CYPIONATE) 200 MG/ML injection INJECT  0.5ML INTO THE MUSCLE ONCE A WEEK 6 mL 1  . torsemide (DEMADEX) 20 MG tablet Take 1 tablet (20 mg total) by mouth daily. 30 tablet 7   No current facility-administered medications for this visit.     REVIEW OF SYSTEMS:  [X]  denotes positive finding, [ ]  denotes negative finding Cardiac  Comments:  Chest pain or chest pressure:    Shortness of breath upon exertion:    Short of breath when lying flat:    Irregular heart rhythm:        Vascular    Pain in calf, thigh, or hip brought on by ambulation:    Pain in feet at night that wakes you up from your sleep:     Blood clot in your veins:    Leg swelling:         Pulmonary    Oxygen at home:    Productive cough:     Wheezing:         Neurologic    Sudden weakness in arms or legs:     Sudden numbness in arms or legs:     Sudden onset of difficulty speaking or slurred speech:    Temporary loss of vision in one eye:     Problems with dizziness:         Gastrointestinal    Blood in stool:     Vomited blood:         Genitourinary    Burning when urinating:     Blood in urine:        Psychiatric    Major depression:         Hematologic    Bleeding problems:    Problems with blood clotting too easily:        Skin    Rashes or ulcers:        Constitutional    Fever or chills:     PHYSICAL EXAM:   Vitals:   07/25/18 0932  BP: (!) 142/84  Pulse: 90  Resp: 20  SpO2: 98%  Weight: 235 lb (106.6 kg)  Height: 5\' 7"  (1.702 m)    GENERAL: The patient is a well-nourished male, in no acute distress. The vital signs are documented above. CARDIAC: There is a regular rate and rhythm.  VASCULAR: I do not detect carotid bruits. He has palpable brachial and radial pulses bilaterally. PULMONARY: There is good air exchange bilaterally without wheezing or rales. ABDOMEN: Soft and non-tender with normal pitched bowel sounds.  MUSCULOSKELETAL: There are no major deformities or cyanosis. NEUROLOGIC: No focal weakness or  paresthesias are detected. SKIN: There are no ulcers or rashes noted. PSYCHIATRIC: The patient has a normal affect.  DATA:    UPPER EXTREMITY ARTERIAL DUPLEX: I have independently interpreted his upper extremity arterial duplex scan.  On the right side he has a triphasic radial and ulnar waveform.  The brachial artery is 0.64 cm in diameter.  On the left side he has a triphasic radial and ulnar waveform.  Brachial artery is 0.71 cm in diameter.  UPPER EXTREMITY VEIN MAP: I have independently interpreted his upper extremity vein map.  On the right side the forearm and upper arm cephalic vein looks reasonable in size.  The basilic vein is also reasonable in size.  On the left side the forearm and upper arm cephalic vein also looks reasonable in size as is the basilic vein.   ASSESSMENT & PLAN:   STAGE V CHRONIC KIDNEY DISEASE: This patient appears to be a good candidate for right arm fistula.  I can likely do a radiocephalic fistula.  The second choice would be a brachiocephalic fistula.  I have had a long discussion with the patient about the indications for the procedure and the potential complications, including but not limited to wound healing problems, bleeding, failure of the fistula to mature, the need for subsequent revisions, and steal syndrome.  We would likely not place an AV graft at this time if the fistula was not possible.  His surgery is scheduled for 07/31/2018.  [name] [facility] [phone]

## 2018-07-25 NOTE — Progress Notes (Signed)
REASON FOR CONSULT:    To evaluate for hemodialysis access.  The patient was referred by Dr. Jimmy Footman.  HPI:   Dylan Chandler is a pleasant 52 y.o. male, who was referred for evaluation for hemodialysis access.  He is left-handed.  He would like to do home dialysis so would like to have access in his right arm so that he can use his left arm to cannulate the fistula.  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  He denies any recent uremic symptoms.  Specifically, he denies nausea, vomiting, fatigue, anorexia, or palpitations.  He does a lot of heavy weightlifting.  I have reviewed the records from the referring office.  The patient has stage V chronic kidney disease.  His last GFR was 10.  Past Medical History:  Diagnosis Date  . CAD (coronary artery disease)   . Cardiac murmur   . Chronic kidney disease, stage III (moderate) (HCC)   . Depression   . Elevated SGOT   . Hyperlipidemia   . Hypertension   . 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 Myocardial infarction ([place])   . Sleep apnea   . Thyroid disease     Family History  Problem Relation Age of Onset  . Cancer Mother   . Hyperlipidemia Mother   . Cancer Father   . Hyperlipidemia Father   . AAA (abdominal aortic aneurysm) Father     SOCIAL HISTORY: Social History   Socioeconomic History  . Marital status: Married    Spouse name: Not on file  . Number of children: Not on file  . Years of education: Not on file  . Highest education level: Not on file  Occupational History  . Not on file  Social Needs  . Financial resource strain: Not hard at all  . Food insecurity:    Worry: Patient refused    Inability: Patient refused  . Transportation needs:    Medical: Patient refused    Non-medical: Patient refused  Tobacco Use  . Smoking status: Never Smoker  . Smokeless tobacco: Never Used  Substance and Sexual Activity  . Alcohol use: No  . Drug use: No  . Sexual activity: Not on file    Lifestyle  . Physical activity:    Days per week: 5 days    Minutes per session: 60 min  . Stress: Not at all  Relationships  . Social connections:    Talks on phone: Patient refused    Gets together: Patient refused    Attends religious service: Patient refused    Active member of club or organization: Patient refused    Attends meetings of clubs or organizations: Patient refused    Relationship status: Patient refused  . Intimate partner violence:    Fear of current or ex partner: Patient refused    Emotionally abused: Patient refused    Physically abused: Patient refused    Forced sexual activity: Patient refused  Other Topics Concern  . Not on file  Social History Narrative  . Not on file    Allergies  Allergen Reactions  . Amlodipine Swelling  . Metoprolol Tartrate Other (See Comments)    fatague    Current Outpatient Medications  Medication Sig Dispense Refill  . ALPRAZolam (XANAX) 1 MG tablet Take 1 tablet (1 mg total) by mouth 2 (two) times daily. 60 tablet 5  . aspirin EC 325 MG tablet Take 325 mg by mouth daily.    . calcitRIOL (ROCALTROL) 0.25 MCG capsule  5  . liothyronine (CYTOMEL) 25 MCG tablet TAKE 3 TABLETS BY MOUTH EVERY DAY 90 tablet 2  . nefazodone (SERZONE) 150 MG tablet Take 1 tablet (150 mg total) by mouth 2 (two) times daily. 180 tablet 4  . nitroGLYCERIN (NITROSTAT) 0.4 MG SL tablet PLACE ONE TABLET UNDER THE TONGUE AS NEEDED FOR CHEST PAIN 25 tablet 6  . ramipril (ALTACE) 10 MG capsule Take 1 capsule (10 mg total) by mouth 2 (two) times daily. 180 capsule 3  . rosuvastatin (CRESTOR) 5 MG tablet Take 1 tablet (5 mg total) by mouth daily. 90 tablet 4  . sertraline (ZOLOFT) 100 MG tablet Take 2 tablets (200 mg total) by mouth 2 (two) times daily. 360 tablet 4  . sertraline (ZOLOFT) 100 MG tablet Take 2 tablets (200 mg total) by mouth 2 (two) times daily. 360 tablet 1  . testosterone cypionate (DEPOTESTOSTERONE CYPIONATE) 200 MG/ML injection INJECT  0.5ML INTO THE MUSCLE ONCE A WEEK 6 mL 1  . torsemide (DEMADEX) 20 MG tablet Take 1 tablet (20 mg total) by mouth daily. 30 tablet 7   No current facility-administered medications for this visit.     REVIEW OF SYSTEMS:  [X]  denotes positive finding, [ ]  denotes negative finding Cardiac  Comments:  Chest pain or chest pressure:    Shortness of breath upon exertion:    Short of breath when lying flat:    Irregular heart rhythm:        Vascular    Pain in calf, thigh, or hip brought on by ambulation:    Pain in feet at night that wakes you up from your sleep:     Blood clot in your veins:    Leg swelling:         Pulmonary    Oxygen at home:    Productive cough:     Wheezing:         Neurologic    Sudden weakness in arms or legs:     Sudden numbness in arms or legs:     Sudden onset of difficulty speaking or slurred speech:    Temporary loss of vision in one eye:     Problems with dizziness:         Gastrointestinal    Blood in stool:     Vomited blood:         Genitourinary    Burning when urinating:     Blood in urine:        Psychiatric    Major depression:         Hematologic    Bleeding problems:    Problems with blood clotting too easily:        Skin    Rashes or ulcers:        Constitutional    Fever or chills:     PHYSICAL EXAM:   Vitals:   07/25/18 0932  BP: (!) 142/84  Pulse: 90  Resp: 20  SpO2: 98%  Weight: 235 lb (106.6 kg)  Height: 5\' 7"  (1.702 m)    GENERAL: The patient is a well-nourished male, in no acute distress. The vital signs are documented above. CARDIAC: There is a regular rate and rhythm.  VASCULAR: I do not detect carotid bruits. He has palpable brachial and radial pulses bilaterally. PULMONARY: There is good air exchange bilaterally without wheezing or rales. ABDOMEN: Soft and non-tender with normal pitched bowel sounds.  MUSCULOSKELETAL: There are no major deformities or cyanosis. NEUROLOGIC: No focal weakness or  paresthesias are detected. SKIN: There are no ulcers or rashes noted. PSYCHIATRIC: The patient has a normal affect.  DATA:    UPPER EXTREMITY ARTERIAL DUPLEX: I have independently interpreted his upper extremity arterial duplex scan.  On the right side he has a triphasic radial and ulnar waveform.  The brachial artery is 0.64 cm in diameter.  On the left side he has a triphasic radial and ulnar waveform.  Brachial artery is 0.71 cm in diameter.  UPPER EXTREMITY VEIN MAP: I have independently interpreted his upper extremity vein map.  On the right side the forearm and upper arm cephalic vein looks reasonable in size.  The basilic vein is also reasonable in size.  On the left side the forearm and upper arm cephalic vein also looks reasonable in size as is the basilic vein.   ASSESSMENT & PLAN:   STAGE V CHRONIC KIDNEY DISEASE: This patient appears to be a good candidate for right arm fistula.  I can likely do a radiocephalic fistula.  The second choice would be a brachiocephalic fistula.  I have had a long discussion with the patient about the indications for the procedure and the potential complications, including but not limited to wound healing problems, bleeding, failure of the fistula to mature, the need for subsequent revisions, and steal syndrome.  We would likely not place an AV graft at this time if the fistula was not possible.  His surgery is scheduled for 07/31/2018.  [name] [facility] [phone]

## 2018-07-30 ENCOUNTER — Other Ambulatory Visit: Payer: Self-pay

## 2018-07-30 ENCOUNTER — Encounter (HOSPITAL_COMMUNITY): Payer: Self-pay | Admitting: *Deleted

## 2018-07-30 NOTE — Progress Notes (Signed)
Mr Dylan Chandler denies chest pain or shortness of breath.  Mr Dylan Chandler does not see a cardiologist and did not follow up with one after stents were placed in 2017.  I asked patient why he did not follow, he replied, "everything was fine then  I did not think I had to."  Mr Dylan Chandler states that he was on a blood thinner for 6 months, but doesn't remember the name of it.  Patient has been on Aspirin, he stopped it 2 weeks ago, "I thought when I went to the vein center that I was having a procedure , so I stopped it before."   Patient denies chest pain or shortness of breath.

## 2018-07-30 NOTE — Progress Notes (Addendum)
Anesthesia Chart Review: SAME DAY WORKUP   Case:  [id number] Date/Time:  07/31/18 0943   Procedure:  ARTERIOVENOUS (AV) FISTULA CREATION ARM (Right )   Anesthesia type:  Monitor Anesthesia Care   Pre-op diagnosis:  chronic kidney disease   Location:  [place] [facility] ROOM 12 / [place] [facility]   Surgeon:  Angelia Mould, MD      DISCUSSION: 52 yo male never smoker for above procedure. Pertinent hx includes Depression, OSA, ESRD not yet on HD, HTN, MI/CAD (RCA stent 2007, STEMI s/p DES to LAD 06/09/2016).  Pt had STEMI 06/09/2016 treated with DES to LAD and advised DAPT for 1 year. It does not appear that he ever followed up with cardiology after discharge. He is seen regularly by PCP and Nephrology but it does not appear he has had repeat Echo or EKG since his STEMI. At that time Echo showed EF of 30-35%. Currently it appears he is taking only ASA 81mg  for antiplatelet. Review of notes indicates pt continues to exercise at a high intensity with heavy weights.  PAT nursing attempted to reach pt 07/28/18 but was unsuccessful. I tried to reach pt 07/30/18, no answer at home or cell. Left VM to return call.  Case discussed with Dr. Tobias Alexander. He advised that because of the nature of the surgery it is okay to proceed, however it is still very important that the pt f/u with cardiology for ongoing care.   He will need DOS eval by assigned anesthesiologist. Anticipate he can proceed as planned barring acute status change and DOS labs acceptable.   ADDENDUM 07/30/2018: Pt returned my call at 3:20. He reports he took prasugrel for 12 months after his PCI and then continued with ASA only. He is currently off his ASA and has been for the past 2 weeks as he thought he would be having this procedure sooner. I advised him that it is very important that he get back into cardiology for regular followup given his history of MI and stent. He verbalized understanding and said he would call today to make a f/u appointment.  VS: There were  no vitals taken for this visit.  PROVIDERS: Guadalupe Maple, MD  Deterding, Jeneen Rinks, MD is Nephrologist last seen 07/09/2018  LABS: Will need DOS labs  Labs Reviewed - No data to display   IMAGES: N/A   EKG: 06/09/2016: NSR. LAD. Inferior infarct, age undetermined, anterolateral infarct, age undetermine.  CV:  1. Cardiac Cath 06/09/2016 Conclusion     Prox RCA lesion, 20 %stenosed.  Mid RCA lesion, 20 %stenosed.  Mid RCA to Dist RCA lesion, 20 %stenosed.  Dist RCA lesion, 30 %stenosed.  Prox Cx lesion, 20 %stenosed.  3rd Mrg lesion, 20 %stenosed.  2nd Mrg lesion, 20 %stenosed.  Ost 1st Diag to 1st Diag lesion, 20 %stenosed.  Ost 2nd Diag to 2nd Diag lesion, 20 %stenosed.  Dist LAD lesion, 20 %stenosed.  A STENT PROMUS PREM MR 3.5X24 drug eluting stent was successfully placed.  Prox LAD to Mid LAD lesion, 99 %stenosed.  Post intervention, there is a 0% residual stenosis.  1. Acute anterior STEMI secondary to sub-totally occluded mid LAD 2. Successful PTCA/DES x 1 mid LAD 3. Patent stent in the distal RCA 4. Non-obstructive disease noted in the RCA and Circumflex    2. Echocardiogram 06/10/2016 Left ventricle: The cavity size was moderately dilated. There was mild concentric hypertrophy. Systolic function was moderately to severely reduced. The estimated ejection fraction was in the range of 30%  to 35%. Dyskinesis of the apical myocardium. Hypokinesis of the mid-apicalanteroseptal and anterior myocardium. Features are consistent with a pseudonormal left ventricular filling pattern, with concomitant abnormal relaxation and increased filling pressure (grade 2 diastolic dysfunction). No evidence of thrombus. - Mitral valve: Calcified annulus. There was severe regurgitation directed centrally, eccentrically, and toward the septum. The acceleration rate of the regurgitant jet was reduced, consistent with a low dP/dt. Severe  regurgitation is suggested by pulmonary vein systolic flow reversal. - Left atrium: The atrium was moderately dilated.   Past Medical History:  Diagnosis Date  . CAD (coronary artery disease)   . Cardiac murmur   . Chronic kidney disease, stage III (moderate) (HCC)   . Depression   . Elevated SGOT   . Hyperlipidemia   . Hypertension   . 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 Myocardial infarction ([place])   . Sleep apnea   . Thyroid disease     Past Surgical History:  Procedure Laterality Date  . CARDIAC CATHETERIZATION N/A 06/09/2016   Procedure: Left Heart Cath and Coronary Angiography;  Surgeon: Burnell Blanks, MD;  Location: [place] [facility];  Service: Cardiovascular;  Laterality: N/A;  . CARDIAC CATHETERIZATION N/A 06/09/2016   Procedure: Coronary Stent Intervention;  Surgeon: Burnell Blanks, MD;  Location: [place] [facility];  Service: Cardiovascular;  Laterality: N/A;  . LASER ABLATION Right    leg  . phlebectomy Right    leg  . SHOULDER SURGERY      MEDICATIONS: No current facility-administered medications for this encounter.    Marland Kitchen ALPRAZolam (XANAX) 1 MG tablet  . calcitRIOL (ROCALTROL) 0.25 MCG capsule  . liothyronine (CYTOMEL) 25 MCG tablet  . loratadine (CLARITIN) 10 MG tablet  . nefazodone (SERZONE) 150 MG tablet  . nitroGLYCERIN (NITROSTAT) 0.4 MG SL tablet  . ramipril (ALTACE) 10 MG capsule  . rosuvastatin (CRESTOR) 5 MG tablet  . sertraline (ZOLOFT) 100 MG tablet  . sodium bicarbonate 650 MG tablet  . testosterone cypionate (DEPOTESTOSTERONE CYPIONATE) 200 MG/ML injection  . aspirin EC 81 MG tablet  . torsemide (DEMADEX) 20 MG tablet    Wynonia Musty [facility]/Anesthesiology Phone [phone] 07/30/2018 10:17 AM

## 2018-07-31 ENCOUNTER — Telehealth: Payer: Self-pay | Admitting: Vascular Surgery

## 2018-07-31 ENCOUNTER — Encounter (HOSPITAL_COMMUNITY): Payer: Self-pay | Admitting: Anesthesiology

## 2018-07-31 ENCOUNTER — Ambulatory Visit (HOSPITAL_COMMUNITY): Payer: BLUE CROSS/BLUE SHIELD | Admitting: Physician Assistant

## 2018-07-31 ENCOUNTER — Encounter (HOSPITAL_COMMUNITY)
Admission: RE | Disposition: A | Discharge status: Home / Self Care | Payer: Self-pay | Source: Ambulatory Visit | Attending: Vascular Surgery

## 2018-07-31 ENCOUNTER — Ambulatory Visit (HOSPITAL_COMMUNITY)
Admission: RE | Admit: 2018-07-31 | Discharge: 2018-07-31 | Disposition: A | Discharge status: Home / Self Care | Payer: BLUE CROSS/BLUE SHIELD | Source: Ambulatory Visit | Attending: Vascular Surgery | Admitting: Vascular Surgery

## 2018-07-31 DIAGNOSIS — Z955 Presence of coronary angioplasty implant and graft: Secondary | ICD-10-CM | POA: Diagnosis not present

## 2018-07-31 DIAGNOSIS — Z7982 Long term (current) use of aspirin: Secondary | ICD-10-CM | POA: Insufficient documentation

## 2018-07-31 DIAGNOSIS — F329 Major depressive disorder, single episode, unspecified: Secondary | ICD-10-CM | POA: Diagnosis not present

## 2018-07-31 DIAGNOSIS — I2510 Atherosclerotic heart disease of native coronary artery without angina pectoris: Secondary | ICD-10-CM | POA: Diagnosis not present

## 2018-07-31 DIAGNOSIS — N185 Chronic kidney disease, stage 5: Secondary | ICD-10-CM | POA: Diagnosis not present

## 2018-07-31 DIAGNOSIS — E230 Hypopituitarism: Secondary | ICD-10-CM | POA: Diagnosis not present

## 2018-07-31 DIAGNOSIS — Z79890 Hormone replacement therapy (postmenopausal): Secondary | ICD-10-CM | POA: Insufficient documentation

## 2018-07-31 DIAGNOSIS — G4733 Obstructive sleep apnea (adult) (pediatric): Secondary | ICD-10-CM | POA: Diagnosis not present

## 2018-07-31 DIAGNOSIS — I120 Hypertensive chronic kidney disease with stage 5 chronic kidney disease or end stage renal disease: Secondary | ICD-10-CM | POA: Diagnosis not present

## 2018-07-31 DIAGNOSIS — N186 End stage renal disease: Secondary | ICD-10-CM | POA: Diagnosis not present

## 2018-07-31 DIAGNOSIS — G4730 Sleep apnea, unspecified: Secondary | ICD-10-CM | POA: Diagnosis not present

## 2018-07-31 DIAGNOSIS — I252 Old myocardial infarction: Secondary | ICD-10-CM | POA: Diagnosis not present

## 2018-07-31 DIAGNOSIS — Z79899 Other long term (current) drug therapy: Secondary | ICD-10-CM | POA: Diagnosis not present

## 2018-07-31 DIAGNOSIS — E785 Hyperlipidemia, unspecified: Secondary | ICD-10-CM | POA: Diagnosis not present

## 2018-07-31 DIAGNOSIS — E039 Hypothyroidism, unspecified: Secondary | ICD-10-CM | POA: Insufficient documentation

## 2018-07-31 HISTORY — DX: Anxiety disorder, unspecified: F41.9

## 2018-07-31 HISTORY — DX: Hypothyroidism, unspecified: E03.9

## 2018-07-31 HISTORY — PX: AV FISTULA PLACEMENT: SHX1204

## 2018-07-31 LAB — POCT I-STAT 4, (NA,K, GLUC, HGB,HCT)
Glucose, Bld: 89 mg/dL (ref 70–99)
HEMATOCRIT: 41 % (ref 39.0–52.0)
Hemoglobin: 13.9 g/dL (ref 13.0–17.0)
Potassium: 4.6 mmol/L (ref 3.5–5.1)
Sodium: 139 mmol/L (ref 135–145)

## 2018-07-31 SURGERY — ARTERIOVENOUS (AV) FISTULA CREATION
Anesthesia: Monitor Anesthesia Care | Site: Arm Lower | Laterality: Right

## 2018-07-31 MED ORDER — SODIUM CHLORIDE 0.9 % IV SOLN
INTRAVENOUS | Status: AC
  Filled 2018-07-31: qty 1.2

## 2018-07-31 MED ORDER — LIDOCAINE 2% (20 MG/ML) 5 ML SYRINGE
INTRAMUSCULAR | Status: DC | PRN
  Administered 2018-07-31: 80 mg via INTRAVENOUS

## 2018-07-31 MED ORDER — LIDOCAINE HCL (PF) 1 % IJ SOLN
INTRAMUSCULAR | Status: AC
  Filled 2018-07-31: qty 30

## 2018-07-31 MED ORDER — SODIUM CHLORIDE 0.9 % IV SOLN
INTRAVENOUS | Status: DC | PRN
  Administered 2018-07-31: 11:00:00

## 2018-07-31 MED ORDER — PROPOFOL 10 MG/ML IV BOLUS
INTRAVENOUS | Status: DC | PRN
  Administered 2018-07-31: 30 mg via INTRAVENOUS

## 2018-07-31 MED ORDER — OXYCODONE HCL 5 MG PO TABS: 5.0000 mg | ORAL_TABLET | ORAL | 0 refills | Status: DC | PRN

## 2018-07-31 MED ORDER — PHENYLEPHRINE 40 MCG/ML (10ML) SYRINGE FOR IV PUSH (FOR BLOOD PRESSURE SUPPORT)
PREFILLED_SYRINGE | INTRAVENOUS | Status: DC | PRN
  Administered 2018-07-31: 80 ug via INTRAVENOUS

## 2018-07-31 MED ORDER — PROPOFOL 10 MG/ML IV BOLUS
INTRAVENOUS | Status: AC
  Filled 2018-07-31: qty 20

## 2018-07-31 MED ORDER — PAPAVERINE HCL 30 MG/ML IJ SOLN
INTRAMUSCULAR | Status: DC | PRN
  Administered 2018-07-31: 60 mg

## 2018-07-31 MED ORDER — FENTANYL CITRATE (PF) 250 MCG/5ML IJ SOLN
INTRAMUSCULAR | Status: AC
  Filled 2018-07-31: qty 5

## 2018-07-31 MED ORDER — HEPARIN SODIUM (PORCINE) 1000 UNIT/ML IJ SOLN
INTRAMUSCULAR | Status: AC
  Filled 2018-07-31: qty 1

## 2018-07-31 MED ORDER — 0.9 % SODIUM CHLORIDE (POUR BTL) OPTIME
TOPICAL | Status: DC | PRN
  Administered 2018-07-31: 1000 mL

## 2018-07-31 MED ORDER — DEXAMETHASONE SODIUM PHOSPHATE 10 MG/ML IJ SOLN
INTRAMUSCULAR | Status: AC
  Filled 2018-07-31: qty 1

## 2018-07-31 MED ORDER — SODIUM CHLORIDE 0.9 % IV SOLN
INTRAVENOUS | Status: DC
  Administered 2018-07-31: 09:00:00 via INTRAVENOUS

## 2018-07-31 MED ORDER — LIDOCAINE-EPINEPHRINE (PF) 1 %-1:200000 IJ SOLN
INTRAMUSCULAR | Status: AC
  Filled 2018-07-31: qty 30

## 2018-07-31 MED ORDER — MIDAZOLAM HCL 2 MG/2ML IJ SOLN
INTRAMUSCULAR | Status: AC
  Filled 2018-07-31: qty 2

## 2018-07-31 MED ORDER — LIDOCAINE-EPINEPHRINE (PF) 1 %-1:200000 IJ SOLN
INTRAMUSCULAR | Status: DC | PRN
  Administered 2018-07-31: 11 mL

## 2018-07-31 MED ORDER — ONDANSETRON HCL 4 MG/2ML IJ SOLN
INTRAMUSCULAR | Status: AC
  Filled 2018-07-31: qty 2

## 2018-07-31 MED ORDER — PAPAVERINE HCL 30 MG/ML IJ SOLN
INTRAMUSCULAR | Status: AC
  Filled 2018-07-31: qty 2

## 2018-07-31 MED ORDER — CEFAZOLIN SODIUM-DEXTROSE 2-4 GM/100ML-% IV SOLN
2.0000 g | INTRAVENOUS | Status: AC
  Administered 2018-07-31: 2 g via INTRAVENOUS
  Filled 2018-07-31: qty 100

## 2018-07-31 MED ORDER — FENTANYL CITRATE (PF) 100 MCG/2ML IJ SOLN
INTRAMUSCULAR | Status: DC | PRN
  Administered 2018-07-31: 50 ug via INTRAVENOUS

## 2018-07-31 MED ORDER — ONDANSETRON HCL 4 MG/2ML IJ SOLN
INTRAMUSCULAR | Status: DC | PRN
  Administered 2018-07-31: 4 mg via INTRAVENOUS

## 2018-07-31 MED ORDER — PROTAMINE SULFATE 10 MG/ML IV SOLN
INTRAVENOUS | Status: AC
  Filled 2018-07-31: qty 5

## 2018-07-31 MED ORDER — PROTAMINE SULFATE 10 MG/ML IV SOLN
INTRAVENOUS | Status: DC | PRN
  Administered 2018-07-31: 10 mg via INTRAVENOUS
  Administered 2018-07-31: 20 mg via INTRAVENOUS
  Administered 2018-07-31 (×2): 10 mg via INTRAVENOUS

## 2018-07-31 MED ORDER — MIDAZOLAM HCL 5 MG/5ML IJ SOLN
INTRAMUSCULAR | Status: DC | PRN
  Administered 2018-07-31: 2 mg via INTRAVENOUS

## 2018-07-31 MED ORDER — SODIUM CHLORIDE 0.9 % IV SOLN: INTRAVENOUS | Status: DC

## 2018-07-31 MED ORDER — DEXAMETHASONE SODIUM PHOSPHATE 10 MG/ML IJ SOLN
INTRAMUSCULAR | Status: DC | PRN
  Administered 2018-07-31: 10 mg via INTRAVENOUS

## 2018-07-31 MED ORDER — HEPARIN SODIUM (PORCINE) 1000 UNIT/ML IJ SOLN
INTRAMUSCULAR | Status: DC | PRN
  Administered 2018-07-31: 10000 [IU] via INTRAVENOUS

## 2018-07-31 MED ORDER — LIDOCAINE 2% (20 MG/ML) 5 ML SYRINGE
INTRAMUSCULAR | Status: AC
  Filled 2018-07-31: qty 5

## 2018-07-31 MED ORDER — PROPOFOL 500 MG/50ML IV EMUL
INTRAVENOUS | Status: DC | PRN
  Administered 2018-07-31: 75 ug/kg/min via INTRAVENOUS

## 2018-07-31 SURGICAL SUPPLY — 34 items
ADH SKN CLS APL DERMABOND .7 (GAUZE/BANDAGES/DRESSINGS) ×1
ARMBAND PINK RESTRICT EXTREMIT (MISCELLANEOUS) ×4 IMPLANT
CANISTER SUCT 3000ML PPV (MISCELLANEOUS) ×2 IMPLANT
CANNULA VESSEL 3MM 2 BLNT TIP (CANNULA) ×3 IMPLANT
CLIP VESOCCLUDE MED 6/CT (CLIP) ×2 IMPLANT
CLIP VESOCCLUDE SM WIDE 6/CT (CLIP) ×5 IMPLANT
COVER PROBE W GEL 5X96 (DRAPES) IMPLANT
DECANTER SPIKE VIAL GLASS SM (MISCELLANEOUS) ×2 IMPLANT
DERMABOND ADVANCED (GAUZE/BANDAGES/DRESSINGS) ×1
DERMABOND ADVANCED .7 DNX12 (GAUZE/BANDAGES/DRESSINGS) ×1 IMPLANT
ELECT REM PT RETURN 9FT ADLT (ELECTROSURGICAL) ×2
ELECTRODE REM PT RTRN 9FT ADLT (ELECTROSURGICAL) ×1 IMPLANT
GLOVE BIO SURGEON STRL SZ7.5 (GLOVE) ×2 IMPLANT
GLOVE BIOGEL PI IND STRL 6.5 (GLOVE) IMPLANT
GLOVE BIOGEL PI IND STRL 8 (GLOVE) ×1 IMPLANT
GLOVE BIOGEL PI INDICATOR 6.5 (GLOVE) ×1
GLOVE BIOGEL PI INDICATOR 8 (GLOVE) ×1
GLOVE ECLIPSE 6.5 STRL STRAW (GLOVE) ×1 IMPLANT
GOWN STRL REUS W/ TWL LRG LVL3 (GOWN DISPOSABLE) ×3 IMPLANT
GOWN STRL REUS W/TWL LRG LVL3 (GOWN DISPOSABLE) ×6
KIT BASIN OR (CUSTOM PROCEDURE TRAY) ×2 IMPLANT
KIT TURNOVER KIT B (KITS) ×2 IMPLANT
NS IRRIG 1000ML POUR BTL (IV SOLUTION) ×2 IMPLANT
PACK CV ACCESS (CUSTOM PROCEDURE TRAY) ×2 IMPLANT
PAD ARMBOARD 7.5X6 YLW CONV (MISCELLANEOUS) ×4 IMPLANT
SPONGE SURGIFOAM ABS GEL 100 (HEMOSTASIS) IMPLANT
SUT PROLENE 6 0 BV (SUTURE) ×2 IMPLANT
SUT VIC AB 3-0 SH 27 (SUTURE) ×2
SUT VIC AB 3-0 SH 27X BRD (SUTURE) ×1 IMPLANT
SUT VICRYL 4-0 PS2 18IN ABS (SUTURE) ×2 IMPLANT
SYR 20CC LL (SYRINGE) ×1 IMPLANT
TOWEL GREEN STERILE (TOWEL DISPOSABLE) ×2 IMPLANT
UNDERPAD 30X30 (UNDERPADS AND DIAPERS) ×2 IMPLANT
WATER STERILE IRR 1000ML POUR (IV SOLUTION) ×2 IMPLANT

## 2018-07-31 NOTE — Telephone Encounter (Signed)
ch appt lvm mld ltr 09/12/18 8am Dialysis duplex 915am p/o MD

## 2018-07-31 NOTE — Transfer of Care (Signed)
Immediate Anesthesia Transfer of Care Note  Patient: Dylan Chandler  Procedure(s) Performed: Right Radiocephalic ARTERIOVENOUS (AV) FISTULA CREATION (Right Arm Lower)  Patient Location: PACU  Anesthesia Type:MAC  Level of Consciousness: oriented, drowsy and patient cooperative  Airway & Oxygen Therapy: Patient Spontanous Breathing and Patient connected to face mask oxygen  Post-op Assessment: Report given to RN and Post -op Vital signs reviewed and stable  Post vital signs: Reviewed  Last Vitals:  Vitals Value Taken Time  BP    Temp    Pulse 81 07/31/2018 12:50 PM  Resp    SpO2 98 % 07/31/2018 12:50 PM  Vitals shown include unvalidated device data.  Last Pain:  Vitals:   07/31/18 0806  TempSrc: Oral         Complications: No apparent anesthesia complications

## 2018-07-31 NOTE — Op Note (Signed)
    NAME: Dylan Chandler    MRN: [id number] DOB: Sep 15, 1966    DATE OF OPERATION: 07/31/2018  PREOP DIAGNOSIS:    Stage IV chronic kidney disease  POSTOP DIAGNOSIS:    Same  PROCEDURE:    Right radiocephalic AV fistula  SURGEON: Judeth Cornfield. Scot Dock, MD, FACS  ASSIST: Laurence Slate, PA  ANESTHESIA: Local with sedation  EBL: Minimal  INDICATIONS:    Ansar Skoda is a 52 y.o. male who is not yet on dialysis.  He presents for new access.  FINDINGS:   The vein was lateral therefore I made 2 separate incisions.  The upper arm cephalic vein was also fairly far lateral.  TECHNIQUE:   The patient was taken to the operating room and sedated by anesthesia.  The right arm was prepped and draped in usual sterile fashion.  After the skin was anesthetized with 1% lidocaine, a longitudinal incision was made of the cephalic vein which was dissected free.  Branches were divided between clips and 3-0 silk ties.  The vein was ligated distally and irrigated up with heparinized saline.  Separate longitudinal incision was made over the radial artery.  The artery was dissected free beneath the fascia.  The patient was heparinized.  Tunnel was graded between the 2 incisions and the vein was brought to the tunnel.  The radial artery was clamped proximally and distally and a longitudinal arteriotomy was made.  The vein was spatulated and sewn into side to the artery using continuous 6-0 Prolene suture.  At the completion was a good thrill in the fistula and a good signal in the radial artery.  The heparin was partially reversed with protamine.  The wounds were then closed with a deep layer of 3-0 Vicryl and the skin closed with 4-0 Vicryl.  [name], MD, FACS [facility]  DATE OF DICTATION:   07/31/2018

## 2018-07-31 NOTE — Anesthesia Procedure Notes (Signed)
Procedure Name: MAC Date/Time: 07/31/2018 11:04 AM Performed by: Jenne Campus, CRNA Pre-anesthesia Checklist: Patient identified, Emergency Drugs available, Suction available and Patient being monitored Oxygen Delivery Method: Simple face mask

## 2018-07-31 NOTE — Anesthesia Preprocedure Evaluation (Addendum)
Anesthesia Evaluation  Patient identified by MRN, date of birth, ID band  Reviewed: Allergy & Precautions  Airway Mallampati: II  TM Distance: >3 FB Neck ROM: Full    Dental  (+) Teeth Intact, Dental Advisory Given   Pulmonary sleep apnea ,    breath sounds clear to auscultation       Cardiovascular hypertension, Pt. on medications + CAD and + Past MI  + Valvular Problems/Murmurs  Rhythm:Regular Rate:Normal     Neuro/Psych    GI/Hepatic negative GI ROS, Neg liver ROS,   Endo/Other  Hypothyroidism   Renal/GU Renal disease     Musculoskeletal   Abdominal   Peds  Hematology   Anesthesia Other Findings   Reproductive/Obstetrics                            Anesthesia Physical Anesthesia Plan  ASA: III  Anesthesia Plan: MAC   Post-op Pain Management:    Induction: Intravenous  PONV Risk Score and Plan: Ondansetron, Dexamethasone and Midazolam  Airway Management Planned: Simple Face Mask and Nasal Cannula  Additional Equipment:   Intra-op Plan:   Post-operative Plan:   Informed Consent: I have reviewed the patients History and Physical, chart, labs and discussed the procedure including the risks, benefits and alternatives for the proposed anesthesia with the patient or authorized representative who has indicated his/her understanding and acceptance.   Dental advisory given  Plan Discussed with: CRNA and Anesthesiologist  Anesthesia Plan Comments:         Anesthesia Quick Evaluation

## 2018-07-31 NOTE — Anesthesia Postprocedure Evaluation (Signed)
Anesthesia Post Note  Patient: Keland Peyton  Procedure(s) Performed: Right Radiocephalic ARTERIOVENOUS (AV) FISTULA CREATION (Right Arm Lower)     Patient location during evaluation: Endoscopy Anesthesia Type: MAC Level of consciousness: awake Pain management: pain level controlled Vital Signs Assessment: post-procedure vital signs reviewed and stable Respiratory status: spontaneous breathing Cardiovascular status: stable Postop Assessment: no apparent nausea or vomiting Anesthetic complications: no    Last Vitals:  Vitals:   07/31/18 0806 07/31/18 1251  BP: 112/79 (P) 97/69  Pulse: 90   Resp: 20   Temp: 36.5 C (P) 36.7 C  SpO2: 98%     Last Pain:  Vitals:   07/31/18 0806  TempSrc: Oral                 Jaylyn Booher

## 2018-07-31 NOTE — Interval H&P Note (Signed)
History and Physical Interval Note:  07/31/2018 10:13 AM  Dylan Chandler  has presented today for surgery, with the diagnosis of chronic kidney disease  The various methods of treatment have been discussed with the patient and family. After consideration of risks, benefits and other options for treatment, the patient has consented to  Procedure(s): ARTERIOVENOUS (AV) FISTULA CREATION ARM (Right) as a surgical intervention .  The patient's history has been reviewed, patient examined, no change in status, stable for surgery.  I have reviewed the patient's chart and labs.  Questions were answered to the patient's satisfaction.     [name]

## 2018-08-01 ENCOUNTER — Encounter (HOSPITAL_COMMUNITY): Payer: Self-pay | Admitting: Vascular Surgery

## 2018-08-02 ENCOUNTER — Other Ambulatory Visit: Payer: Self-pay

## 2018-08-02 DIAGNOSIS — N184 Chronic kidney disease, stage 4 (severe): Secondary | ICD-10-CM

## 2018-08-09 ENCOUNTER — Other Ambulatory Visit: Payer: Self-pay | Admitting: Adult Health

## 2018-08-12 ENCOUNTER — Other Ambulatory Visit: Payer: Self-pay | Admitting: Family Medicine

## 2018-08-12 DIAGNOSIS — E230 Hypopituitarism: Secondary | ICD-10-CM

## 2018-08-13 NOTE — Telephone Encounter (Signed)
Liothyronine (Cytomel) refill Last Refill:07/18/18 #90/2 refills Last OV: 04/23/18 PCP: Dr. Jeananne Rama Pharmacy: [id number] - [place], [place] - [address] [phone] (Phone) [phone] (Fax)

## 2018-09-12 ENCOUNTER — Ambulatory Visit (INDEPENDENT_AMBULATORY_CARE_PROVIDER_SITE_OTHER): Payer: BLUE CROSS/BLUE SHIELD | Admitting: Vascular Surgery

## 2018-09-12 ENCOUNTER — Encounter: Payer: Self-pay | Admitting: Vascular Surgery

## 2018-09-12 ENCOUNTER — Other Ambulatory Visit: Payer: Self-pay

## 2018-09-12 ENCOUNTER — Ambulatory Visit (HOSPITAL_COMMUNITY)
Admission: RE | Admit: 2018-09-12 | Discharge: 2018-09-12 | Disposition: A | Discharge status: Home / Self Care | Payer: BLUE CROSS/BLUE SHIELD | Source: Ambulatory Visit | Attending: Vascular Surgery | Admitting: Vascular Surgery

## 2018-09-12 VITALS — BP 135/28 | HR 80 | Temp 97.5°F | Resp 18 | Ht 69.0 in | Wt 233.5 lb

## 2018-09-12 DIAGNOSIS — N184 Chronic kidney disease, stage 4 (severe): Secondary | ICD-10-CM

## 2018-09-12 NOTE — Progress Notes (Signed)
Patient name: Savion Washam MRN: [id number] DOB: 1965/12/24 Sex: male  REASON FOR VISIT:   Follow-up after right radiocephalic AV fistula.  HPI:   Gaither Biehn is a pleasant 52 y.o. male who had a right radiocephalic fistula placed on 07/31/2018.  He was not yet on dialysis.  He comes in for his first outpatient visit.  He denies any specific complaints.  He seems very motivated to try to keep his kidney function stable and is considering a plant-based diet after the [employer] has finally been released.  He has no pain or paresthesias in his left arm.  Current Outpatient Medications  Medication Sig Dispense Refill  . ALPRAZolam (XANAX) 1 MG tablet Take 1 tablet (1 mg total) by mouth 2 (two) times daily. 60 tablet 5  . aspirin EC 81 MG tablet Take 243 mg by mouth at bedtime.    . calcitRIOL (ROCALTROL) 0.25 MCG capsule Take 0.25-0.5 mcg by mouth See admin instructions. Alternate taking 0.25 mcg the first day then take 0.5 mcg the next  5  . liothyronine (CYTOMEL) 25 MCG tablet TAKE 3 TABLETS BY MOUTH EVERY DAY 90 tablet 2  . nefazodone (SERZONE) 150 MG tablet Take 1 tablet (150 mg total) by mouth 2 (two) times daily. 180 tablet 4  . ramipril (ALTACE) 10 MG capsule TAKE 1 CAPSULE (10 MG TOTAL) BY MOUTH 2 (TWO) TIMES DAILY. 180 capsule 0  . rosuvastatin (CRESTOR) 5 MG tablet Take 1 tablet (5 mg total) by mouth daily. (Patient taking differently: Take 5 mg by mouth every Monday, Wednesday, and Friday. ) 90 tablet 4  . sertraline (ZOLOFT) 100 MG tablet Take 2 tablets (200 mg total) by mouth 2 (two) times daily. 360 tablet 1  . sodium bicarbonate 650 MG tablet Take 1,300 mg by mouth 2 (two) times daily.    Marland Kitchen testosterone cypionate (DEPOTESTOSTERONE CYPIONATE) 200 MG/ML injection INJECT 0.5ML INTO THE MUSCLE ONCE A WEEK (Patient taking differently: Inject 100 mg into the muscle every Friday. ) 6 mL 1  . loratadine (CLARITIN) 10 MG tablet Take 10 mg by mouth daily as needed  for allergies.    . nitroGLYCERIN (NITROSTAT) 0.4 MG SL tablet PLACE ONE TABLET UNDER THE TONGUE AS NEEDED FOR CHEST PAIN (Patient not taking: No sig reported) 25 tablet 6  . oxyCODONE (ROXICODONE) 5 MG immediate release tablet Take 1 tablet (5 mg total) by mouth every 4 (four) hours as needed. (Patient not taking: Reported on 09/12/2018) 15 tablet 0   No current facility-administered medications for this visit.     REVIEW OF SYSTEMS:  [X]  denotes positive finding, [ ]  denotes negative finding Vascular    Leg swelling    Cardiac    Chest pain or chest pressure:    Shortness of breath upon exertion:    Short of breath when lying flat:    Irregular heart rhythm:    Constitutional    Fever or chills:     PHYSICAL EXAM:   Vitals:   09/12/18 0849  BP: (!) 135/28  Pulse: 80  Resp: 18  Temp: (!) 97.5 F (36.4 C)  TempSrc: Oral  SpO2: 98%  Weight: 233 lb 8 oz (105.9 kg)  Height: 5\' 9"  (1.753 m)    GENERAL: The patient is a well-nourished male, in no acute distress. The vital signs are documented above. CARDIOVASCULAR: There is a regular rate and rhythm. PULMONARY: There is good air exchange bilaterally without wheezing or rales. There is right  forearm fistula has an excellent thrill.  His incisions are healing nicely.  DATA:   DUPLEX: I have independently interpreted his duplex of his AV fistula today.  The diameters of the fistula ranged from 0.62-0.82 cm.  Depth ranged from 0.3-0.47 cm.  There are couple small competing branches identified.  MEDICAL ISSUES:   STATUS POST RIGHT RADIOCEPHALIC AV FISTULA: This fistula is maturing nicely.  This should be ready for access in approximately 1 more month.  However he is not on dialysis and hopefully will not need it for a long time.  I will see him back as needed.  [name] [facility] [phone]

## 2018-10-06 ENCOUNTER — Other Ambulatory Visit: Payer: Self-pay | Admitting: Family Medicine

## 2018-10-06 DIAGNOSIS — F329 Major depressive disorder, single episode, unspecified: Secondary | ICD-10-CM

## 2018-10-06 DIAGNOSIS — F32A Depression, unspecified: Secondary | ICD-10-CM

## 2018-10-08 NOTE — Telephone Encounter (Signed)
Requested medication (s) are due for refill today: yes  Requested medication (s) are on the active medication list: yes  Last refill:  10/04/17 for 180 tabs and 4 refills  Future visit scheduled: yes  Notes to clinic:  Med expired on 10/04/18. Nefazodone failed. Next office visit 10/31/18  Requested Prescriptions  Pending Prescriptions Disp Refills   nefazodone (SERZONE) 150 MG tablet [Pharmacy Med Name: NEFAZODONE HCL 150 MG TABLET] 60 tablet 14    Sig: TAKE 1 TABLET BY MOUTH TWICE A DAY     Psychiatry: Antidepressants - Serotonin Modulator - nefazodone Failed - 10/06/2018  2:00 AM      Failed - AST in normal range and within 360 days    AST (SGOT) [place], Waived  Date Value Ref Range Status  04/23/2018 29 11 - 38 U/L Final         Passed - ALT in normal range and within 360 days    ALT (SGPT) Piccolo, Waived  Date Value Ref Range Status  04/23/2018 45 10 - 47 U/L Final         Passed - Completed PHQ-2 or PHQ-9 in the last 360 days.      Passed - Valid encounter within last 6 months    Recent Outpatient Visits          5 months ago Essential hypertension   [place] Criss[place], Jeannette How, MD   1 year ago Essential hypertension   [place] Crissman, Jeannette How, MD   1 year ago Essential hypertension   [place], Jeannette How, MD   1 year ago Hypogonadism male   [place], Jeannette How, MD   2 years ago Routine general medical examination at a health care facility   [place], MD      Future Appointments            In 3 weeks Crissman, Jeannette How, MD [facility]

## 2018-10-15 ENCOUNTER — Other Ambulatory Visit: Payer: Self-pay | Admitting: Family Medicine

## 2018-10-15 DIAGNOSIS — F329 Major depressive disorder, single episode, unspecified: Secondary | ICD-10-CM

## 2018-10-15 DIAGNOSIS — F32A Depression, unspecified: Secondary | ICD-10-CM

## 2018-10-15 NOTE — Telephone Encounter (Signed)
Requested medication (s) are due for refill today: yes  Requested medication (s) are on the active medication list: yes    Last refill: 04/23/18  #60  5 refills  Future visit scheduled yes 10/31/18 Dr. Jeananne Rama  Notes to clinic:not delegated  Requested Prescriptions  Pending Prescriptions Disp Refills   ALPRAZolam (XANAX) 1 MG tablet [Pharmacy Med Name: ALPRAZOLAM 1 MG TABLET] 60 tablet     Sig: TAKE 1 TABLET BY MOUTH TWICE A DAY     Not Delegated - Psychiatry:  Anxiolytics/Hypnotics Failed - 10/15/2018 12:47 PM      Failed - This refill cannot be delegated      Failed - Urine Drug Screen completed in last 360 days.      Passed - Valid encounter within last 6 months    Recent Outpatient Visits          5 months ago Essential hypertension   [place] Crissman, Jeannette How, MD   1 year ago Essential hypertension   [place] Crissman, Jeannette How, MD   1 year ago Essential hypertension   [place], Jeannette How, MD   1 year ago Hypogonadism male   [place], Jeannette How, MD   2 years ago Routine general medical examination at a health care facility   [place], MD      Future Appointments            In 2 weeks Crissman, Jeannette How, MD [facility], [facility]           yes

## 2018-10-31 ENCOUNTER — Ambulatory Visit (INDEPENDENT_AMBULATORY_CARE_PROVIDER_SITE_OTHER): Payer: BLUE CROSS/BLUE SHIELD | Admitting: Family Medicine

## 2018-10-31 ENCOUNTER — Other Ambulatory Visit: Payer: Self-pay | Admitting: Family Medicine

## 2018-10-31 ENCOUNTER — Encounter: Payer: Self-pay | Admitting: Family Medicine

## 2018-10-31 VITALS — BP 120/70 | HR 81 | Temp 98.3°F | Ht 68.0 in | Wt 234.8 lb

## 2018-10-31 DIAGNOSIS — E7800 Pure hypercholesterolemia, unspecified: Secondary | ICD-10-CM

## 2018-10-31 DIAGNOSIS — F32A Depression, unspecified: Secondary | ICD-10-CM

## 2018-10-31 DIAGNOSIS — F419 Anxiety disorder, unspecified: Secondary | ICD-10-CM

## 2018-10-31 DIAGNOSIS — F329 Major depressive disorder, single episode, unspecified: Secondary | ICD-10-CM

## 2018-10-31 DIAGNOSIS — I120 Hypertensive chronic kidney disease with stage 5 chronic kidney disease or end stage renal disease: Secondary | ICD-10-CM

## 2018-10-31 DIAGNOSIS — E230 Hypopituitarism: Secondary | ICD-10-CM

## 2018-10-31 DIAGNOSIS — E790 Hyperuricemia without signs of inflammatory arthritis and tophaceous disease: Secondary | ICD-10-CM

## 2018-10-31 DIAGNOSIS — N185 Chronic kidney disease, stage 5: Secondary | ICD-10-CM

## 2018-10-31 DIAGNOSIS — Z114 Encounter for screening for human immunodeficiency virus [HIV]: Secondary | ICD-10-CM | POA: Diagnosis not present

## 2018-10-31 DIAGNOSIS — I2102 ST elevation (STEMI) myocardial infarction involving left anterior descending coronary artery: Secondary | ICD-10-CM

## 2018-10-31 DIAGNOSIS — Z0000 Encounter for general adult medical examination without abnormal findings: Secondary | ICD-10-CM

## 2018-10-31 DIAGNOSIS — I10 Essential (primary) hypertension: Secondary | ICD-10-CM

## 2018-10-31 LAB — URINALYSIS, ROUTINE W REFLEX MICROSCOPIC
BILIRUBIN UA: NEGATIVE
GLUCOSE, UA: NEGATIVE
KETONES UA: NEGATIVE
Leukocytes, UA: NEGATIVE
NITRITE UA: NEGATIVE
Specific Gravity, UA: 1.015 (ref 1.005–1.030)
Urobilinogen, Ur: 0.2 mg/dL (ref 0.2–1.0)
pH, UA: 5.5 (ref 5.0–7.5)

## 2018-10-31 MED ORDER — LIOTHYRONINE SODIUM 25 MCG PO TABS: 50.0000 ug | ORAL_TABLET | Freq: Every day | ORAL | 4 refills | Status: DC

## 2018-10-31 MED ORDER — TESTOSTERONE CYPIONATE 200 MG/ML IM SOLN: INTRAMUSCULAR | 1 refills | Status: DC

## 2018-10-31 MED ORDER — SERTRALINE HCL 100 MG PO TABS: 200.0000 mg | ORAL_TABLET | Freq: Two times a day (BID) | ORAL | 4 refills | Status: DC

## 2018-10-31 MED ORDER — ROSUVASTATIN CALCIUM 5 MG PO TABS: 5.0000 mg | ORAL_TABLET | Freq: Every day | ORAL | 4 refills | Status: DC

## 2018-10-31 MED ORDER — NEFAZODONE HCL 150 MG PO TABS: 150.0000 mg | ORAL_TABLET | Freq: Two times a day (BID) | ORAL | 14 refills | Status: DC

## 2018-10-31 MED ORDER — RAMIPRIL 10 MG PO CAPS: 10.0000 mg | ORAL_CAPSULE | Freq: Two times a day (BID) | ORAL | 4 refills | Status: DC

## 2018-10-31 NOTE — Progress Notes (Signed)
BP 120/70 (BP Location: Left Arm, Patient Position: Sitting, Cuff Size: Large)   Pulse 81   Temp 98.3 F (36.8 C) (Oral)   Ht 5\' 8"  (1.727 m)   Wt 234 lb 12.8 oz (106.5 kg)   SpO2 97%   BMI 35.70 kg/m    Subjective:    Patient ID: Dylan Chandler, male    DOB: 01/25/1966, 52 y.o.   MRN: [id number]  HPI: Dylan Chandler is a 52 y.o. male  Chief Complaint  Patient presents with  . Annual Exam   Patient all in all doing well renal function has stayed stable has fistula in place which is healing well. Is concerned about with renal failure his insulin growth factor which will be checked. Patient doing well with his testosterone replacement.  Took his shot yesterday Blood pressures stayed stable. Situational depression anxiety nerves stable patient remained stable on taking high dose antidepressants.  And Xanax 1 mg twice a day.  Usage has remained stable for years. Also doing well with cholesterol low dose taking every other day without problems or myalgias.   Relevant past medical, surgical, family and social history reviewed and updated as indicated. Interim medical history since our last visit reviewed. Allergies and medications reviewed and updated.  Review of Systems  Constitutional: Negative.   HENT: Negative.   Eyes: Negative.   Respiratory: Negative.   Cardiovascular: Negative.   Gastrointestinal: Negative.   Endocrine: Negative.   Genitourinary: Negative.   Musculoskeletal: Negative.   Skin: Negative.   Allergic/Immunologic: Negative.   Neurological: Negative.   Hematological: Negative.   Psychiatric/Behavioral: Negative.     Per HPI unless specifically indicated above     Objective:    BP 120/70 (BP Location: Left Arm, Patient Position: Sitting, Cuff Size: Large)   Pulse 81   Temp 98.3 F (36.8 C) (Oral)   Ht 5\' 8"  (1.727 m)   Wt 234 lb 12.8 oz (106.5 kg)   SpO2 97%   BMI 35.70 kg/m   Wt Readings from Last 3 Encounters:  10/31/18 234 lb 12.8  oz (106.5 kg)  09/12/18 233 lb 8 oz (105.9 kg)  07/31/18 235 lb (106.6 kg)    Physical Exam Constitutional:      Appearance: He is well-developed.  HENT:     Head: Normocephalic and atraumatic.     Right Ear: External ear normal.     Left Ear: External ear normal.  Eyes:     Conjunctiva/sclera: Conjunctivae normal.     Pupils: Pupils are equal, round, and reactive to light.  Neck:     Musculoskeletal: Normal range of motion and neck supple.  Cardiovascular:     Rate and Rhythm: Normal rate and regular rhythm.     Heart sounds: Normal heart sounds.  Pulmonary:     Effort: Pulmonary effort is normal.     Breath sounds: Normal breath sounds.  Abdominal:     General: Bowel sounds are normal.     Palpations: Abdomen is soft. There is no hepatomegaly or splenomegaly.  Genitourinary:    Penis: Normal.      Prostate: Normal.     Rectum: Normal.  Musculoskeletal: Normal range of motion.  Skin:    Findings: No erythema or rash.  Neurological:     Mental Status: He is alert and oriented to person, place, and time.     Deep Tendon Reflexes: Reflexes are normal and symmetric.  Psychiatric:        Behavior: Behavior normal.  Thought Content: Thought content normal.        Judgment: Judgment normal.     Results for orders placed or performed during the hospital encounter of 07/31/18  I-STAT 4, (NA,K, GLUC, HGB,HCT)  Result Value Ref Range   Sodium 139 135 - 145 mmol/L   Potassium 4.6 3.5 - 5.1 mmol/L   Glucose, Bld 89 70 - 99 mg/dL   HCT 41.0 39.0 - 52.0 %   Hemoglobin 13.9 13.0 - 17.0 g/dL      Assessment & Plan:   Problem List Items Addressed This Visit      Cardiovascular and Mediastinum   Hypertension    The current medical regimen is effective;  continue present plan and medications.       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)    The current medical regimen is effective;  continue present plan and medications.       Relevant Medications   testosterone cypionate (DEPOTESTOSTERONE CYPIONATE) 200 MG/ML injection   liothyronine (CYTOMEL) 25 MCG tablet   Other Relevant Orders   Testosterone     Genitourinary   CKD stage 5 secondary to hypertension ([place])    Followed by nephrology       Relevant Orders   Insulin-like growth factor   Comprehensive metabolic panel   Lipid panel   CBC with Differential/Platelet   TSH   Urinalysis, Routine w reflex microscopic   PSA   Uric acid     Other   Hyperlipidemia    The current medical regimen is effective;  continue present plan and medications.       Relevant Medications   ramipril (ALTACE) 10 MG capsule   rosuvastatin (CRESTOR) 5 MG tablet   Other Relevant Orders   Lipid panel   Depression    The current medical regimen is effective;  continue present plan and medications.       Relevant Medications   nefazodone (SERZONE) 150 MG tablet   sertraline (ZOLOFT) 100 MG tablet   Elevated uric acid in blood    Check labs      Anxiety    Stable for years on current meds      Relevant Medications   nefazodone (SERZONE) 150 MG tablet   sertraline (ZOLOFT) 100 MG tablet    Other Visit Diagnoses    Encounter for screening for HIV    -  Primary   Relevant Orders   HIV Antibody (routine testing w rflx)   PE (physical exam), annual       Relevant Orders   Comprehensive metabolic panel   Lipid panel   CBC with Differential/Platelet   TSH   Urinalysis, Routine w reflex microscopic   PSA   Uric acid       Follow up plan: Return in about 6 months (around 05/02/2019) for testosterone, PSA, CBC.

## 2018-10-31 NOTE — Assessment & Plan Note (Signed)
The current medical regimen is effective;  continue present plan and medications.  

## 2018-10-31 NOTE — Assessment & Plan Note (Signed)
Check labs 

## 2018-10-31 NOTE — Assessment & Plan Note (Signed)
Stable for years on current meds

## 2018-10-31 NOTE — Assessment & Plan Note (Signed)
Followed by nephrology. 

## 2018-11-01 LAB — HIV ANTIBODY (ROUTINE TESTING W REFLEX): HIV SCREEN 4TH GENERATION: NONREACTIVE

## 2018-11-01 LAB — COMPREHENSIVE METABOLIC PANEL
ALBUMIN: 4 g/dL (ref 3.5–5.5)
ALK PHOS: 77 IU/L (ref 39–117)
ALT: 22 IU/L (ref 0–44)
AST: 18 IU/L (ref 0–40)
Albumin/Globulin Ratio: 1.9 (ref 1.2–2.2)
BUN / CREAT RATIO: 16 (ref 9–20)
BUN: 99 mg/dL (ref 6–24)
CHLORIDE: 108 mmol/L — AB (ref 96–106)
CO2: 15 mmol/L — ABNORMAL LOW (ref 20–29)
Calcium: 8.9 mg/dL (ref 8.7–10.2)
Creatinine, Ser: 6.35 mg/dL — ABNORMAL HIGH (ref 0.76–1.27)
GFR calc Af Amer: 11 mL/min/{1.73_m2} — ABNORMAL LOW (ref >59)
GFR calc non Af Amer: 9 mL/min/{1.73_m2} — ABNORMAL LOW (ref >59)
GLOBULIN, TOTAL: 2.1 g/dL (ref 1.5–4.5)
Glucose: 97 mg/dL (ref 65–99)
Potassium: 4.8 mmol/L (ref 3.5–5.2)
SODIUM: 140 mmol/L (ref 134–144)
Total Protein: 6.1 g/dL (ref 6.0–8.5)

## 2018-11-01 LAB — CBC WITH DIFFERENTIAL/PLATELET
BASOS: 1 %
Basophils Absolute: 0 10*3/uL (ref 0.0–0.2)
EOS (ABSOLUTE): 0.2 10*3/uL (ref 0.0–0.4)
EOS: 3 %
HEMATOCRIT: 40.8 % (ref 37.5–51.0)
HEMOGLOBIN: 13.9 g/dL (ref 13.0–17.7)
IMMATURE GRANS (ABS): 0 10*3/uL (ref 0.0–0.1)
IMMATURE GRANULOCYTES: 0 %
LYMPHS: 20 %
Lymphocytes Absolute: 1.5 10*3/uL (ref 0.7–3.1)
MCH: 30.3 pg (ref 26.6–33.0)
MCHC: 34.1 g/dL (ref 31.5–35.7)
MCV: 89 fL (ref 79–97)
MONOCYTES: 10 %
Monocytes Absolute: 0.7 10*3/uL (ref 0.1–0.9)
NEUTROS PCT: 66 %
Neutrophils Absolute: 5.1 10*3/uL (ref 1.4–7.0)
PLATELETS: 143 10*3/uL — AB (ref 150–450)
RBC: 4.58 x10E6/uL (ref 4.14–5.80)
RDW: 13.5 % (ref 12.3–15.4)
WBC: 7.6 10*3/uL (ref 3.4–10.8)

## 2018-11-01 LAB — LIPID PANEL
CHOLESTEROL TOTAL: 156 mg/dL (ref 100–199)
Chol/HDL Ratio: 5 ratio (ref 0.0–5.0)
HDL: 31 mg/dL — ABNORMAL LOW (ref >39)
LDL Calculated: 91 mg/dL (ref 0–99)
Triglycerides: 168 mg/dL — ABNORMAL HIGH (ref 0–149)
VLDL Cholesterol Cal: 34 mg/dL (ref 5–40)

## 2018-11-01 LAB — TSH: TSH: 0.127 u[IU]/mL — ABNORMAL LOW (ref 0.450–4.500)

## 2018-11-01 LAB — URIC ACID: URIC ACID: 9.3 mg/dL — AB (ref 3.7–8.6)

## 2018-11-01 LAB — PSA: Prostate Specific Ag, Serum: 0.6 ng/mL (ref 0.0–4.0)

## 2018-11-01 LAB — INSULIN-LIKE GROWTH FACTOR: Insulin-Like GF-1: 193 ng/mL (ref 61–200)

## 2018-11-02 ENCOUNTER — Encounter: Payer: Self-pay | Admitting: Family Medicine

## 2019-01-26 ENCOUNTER — Other Ambulatory Visit: Payer: Self-pay | Admitting: Family Medicine

## 2019-01-26 DIAGNOSIS — E230 Hypopituitarism: Secondary | ICD-10-CM

## 2019-01-28 NOTE — Telephone Encounter (Signed)
Requested medication (s) are due for refill today: yes  Requested medication (s) are on the active medication list: yes  Last refill:  10/31/2018 for 180 tabs and 4 refills  Future visit scheduled: yes  Notes to clinic:  Hypothyroid agents failed.  Requested Prescriptions  Pending Prescriptions Disp Refills   liothyronine (CYTOMEL) 25 MCG tablet [Pharmacy Med Name: LIOTHYRONINE SOD 25 MCG TAB] 270 tablet 0    Sig: TAKE 3 TABLETS BY MOUTH EVERY DAY     Endocrinology:  Hypothyroid Agents Failed - 01/26/2019  9:48 AM      Failed - TSH needs to be rechecked within 3 months after an abnormal result. Refill until TSH is due.      Failed - TSH in normal range and within 360 days    TSH  Date Value Ref Range Status  10/31/2018 0.127 (L) 0.450 - 4.500 uIU/mL Final         Passed - Valid encounter within last 12 months    Recent Outpatient Visits          2 months ago Encounter for screening for HIV   [place] [facility], Jeannette How, MD   9 months ago Essential hypertension   [place] [facility], Jeannette How, MD   1 year ago Essential hypertension   [place] [facility], Jeannette How, MD   1 year ago Essential hypertension   [place], Jeannette How, MD   2 years ago Hypogonadism male   [place], Jeannette How, MD      Future Appointments            In 3 months [facility], Jeannette How, MD [facility]

## 2019-03-06 ENCOUNTER — Other Ambulatory Visit: Payer: Self-pay | Admitting: Family Medicine

## 2019-03-06 DIAGNOSIS — F329 Major depressive disorder, single episode, unspecified: Secondary | ICD-10-CM

## 2019-03-06 DIAGNOSIS — F32A Depression, unspecified: Secondary | ICD-10-CM

## 2019-03-06 NOTE — Telephone Encounter (Signed)
Requested medication (s) are due for refill today:  No; see note from pharmacy  Requested medication (s) are on the active medication list:  yes  Future visit scheduled:  yes  Last Refill: 10/31/18; #360; RF x 4   Note to clinic: per pharmacy note "this Sig is declined by insurance; please contact them to see what they will cover; this med is expensive for the patient."   Requested Prescriptions  Pending Prescriptions Disp Refills   sertraline (ZOLOFT) 100 MG tablet [Pharmacy Med Name: SERTRALINE HCL 100 MG TABLET] 360 tablet 1    Sig: Take 2 tablets (200 mg total) by mouth 2 (two) times daily.     Psychiatry:  Antidepressants - SSRI Passed - 03/06/2019 11:45 AM      Passed - Completed PHQ-2 or PHQ-9 in the last 360 days.      Passed - Valid encounter within last 6 months    Recent Outpatient Visits          4 months ago Encounter for screening for HIV   [place] [facility], Jeannette How, MD   10 months ago Essential hypertension   [place] [facility], Jeannette How, MD   1 year ago Essential hypertension   [place] [facility], Jeannette How, MD   1 year ago Essential hypertension   [place], Jeannette How, MD   2 years ago Hypogonadism male   [place], Jeannette How, MD      Future Appointments            In 2 months [facility], Jeannette How, MD [facility]

## 2019-03-26 ENCOUNTER — Encounter: Payer: Self-pay | Admitting: Family Medicine

## 2019-03-27 ENCOUNTER — Other Ambulatory Visit: Payer: Self-pay | Admitting: Family Medicine

## 2019-03-27 DIAGNOSIS — N185 Chronic kidney disease, stage 5: Secondary | ICD-10-CM

## 2019-03-27 DIAGNOSIS — I120 Hypertensive chronic kidney disease with stage 5 chronic kidney disease or end stage renal disease: Secondary | ICD-10-CM

## 2019-03-27 NOTE — Progress Notes (Signed)
Phone call Discussed with patient due to insurance changes patient can no longer see Dr. Rondell Reams for nephrology in [place] has to go to Dr. Tamala Julian at [facility] will go ahead with those changes for patient's class V CKD.

## 2019-04-09 ENCOUNTER — Other Ambulatory Visit: Payer: Self-pay | Admitting: Family Medicine

## 2019-04-09 DIAGNOSIS — F329 Major depressive disorder, single episode, unspecified: Secondary | ICD-10-CM

## 2019-04-09 DIAGNOSIS — F32A Depression, unspecified: Secondary | ICD-10-CM

## 2019-04-28 ENCOUNTER — Other Ambulatory Visit: Payer: Self-pay | Admitting: Family Medicine

## 2019-04-28 DIAGNOSIS — E230 Hypopituitarism: Secondary | ICD-10-CM

## 2019-05-03 ENCOUNTER — Telehealth: Payer: Self-pay

## 2019-05-03 ENCOUNTER — Other Ambulatory Visit: Payer: Self-pay | Admitting: Family Medicine

## 2019-05-03 MED ORDER — SILDENAFIL CITRATE 20 MG PO TABS: 20.0000 mg | ORAL_TABLET | Freq: Every day | ORAL | 12 refills | Status: DC | PRN

## 2019-05-03 NOTE — Telephone Encounter (Signed)
Patient requesting 30 tablets of Sildenafil 100mg .  No Dx for ED or previous Rx for Sildenafil.

## 2019-05-08 ENCOUNTER — Ambulatory Visit: Payer: BLUE CROSS/BLUE SHIELD | Admitting: Family Medicine

## 2019-05-25 ENCOUNTER — Other Ambulatory Visit: Payer: Self-pay | Admitting: Family Medicine

## 2019-05-25 ENCOUNTER — Encounter: Payer: Self-pay | Admitting: Family Medicine

## 2019-05-25 DIAGNOSIS — E230 Hypopituitarism: Secondary | ICD-10-CM

## 2019-05-26 NOTE — Telephone Encounter (Signed)
Requested medication (s) are due for refill today: yes  Requested medication (s) are on the active medication list: yes  Last refill:  10/31/18  Future visit scheduled: ye  Notes to clinic:  Testosterone controlled substance not delegated to NT to refill   Requested Prescriptions  Pending Prescriptions Disp Refills   testosterone cypionate (DEPOTESTOSTERONE CYPIONATE) 200 MG/ML injection [Pharmacy Med Name: TESTOSTERONE CYP 200 MG/ML] 6 mL     Sig: INJECT 0.5ML INTO THE MUSCLE ONCE A WEEK     Off-Protocol Failed - 05/25/2019  9:23 AM      Failed - Medication not assigned to a protocol, review manually.      Passed - Valid encounter within last 12 months    Recent Outpatient Visits          6 months ago Encounter for screening for HIV   [facility], Jeannette How, MD   1 year ago Essential hypertension   [place] [facility], Jeannette How, MD   1 year ago Essential hypertension   [place] [facility], Jeannette How, MD   2 years ago Essential hypertension   [place], Jeannette How, MD   2 years ago Hypogonadism male   [place], Jeannette How, MD      Future Appointments            In 3 days [facility], Jeannette How, MD [facility]

## 2019-05-29 ENCOUNTER — Encounter: Payer: Self-pay | Admitting: Family Medicine

## 2019-05-29 ENCOUNTER — Ambulatory Visit (INDEPENDENT_AMBULATORY_CARE_PROVIDER_SITE_OTHER): Payer: Self-pay | Admitting: Family Medicine

## 2019-05-29 DIAGNOSIS — E230 Hypopituitarism: Secondary | ICD-10-CM

## 2019-05-29 DIAGNOSIS — I10 Essential (primary) hypertension: Secondary | ICD-10-CM

## 2019-05-29 DIAGNOSIS — N185 Chronic kidney disease, stage 5: Secondary | ICD-10-CM

## 2019-05-29 DIAGNOSIS — F339 Major depressive disorder, recurrent, unspecified: Secondary | ICD-10-CM

## 2019-05-29 DIAGNOSIS — F330 Major depressive disorder, recurrent, mild: Secondary | ICD-10-CM | POA: Insufficient documentation

## 2019-05-29 DIAGNOSIS — I120 Hypertensive chronic kidney disease with stage 5 chronic kidney disease or end stage renal disease: Secondary | ICD-10-CM

## 2019-05-29 NOTE — Assessment & Plan Note (Signed)
Discussed medication and potassium

## 2019-05-29 NOTE — Assessment & Plan Note (Signed)
Getting ready to start dialysis

## 2019-05-29 NOTE — Progress Notes (Signed)
There were no vitals taken for this visit.   Subjective:    Patient ID: Dylan Chandler, male    DOB: 09-04-1966, 53 y.o.   MRN: [id number]  HPI: Dylan Chandler is a 53 y.o. male  Med check Discussed medication with patient and depression nerves.  Patient feeling stable but had a blow with recommendation from nephrology this week to start on dialysis and transplant list.  Also discussed was transplant list including hepatitis C positive patients with ability to treat hepatitis C. Reviewed dialysis medications anxiety and depression medications which are stable. Patient also stable with testosterone replacement  Relevant past medical, surgical, family and social history reviewed and updated as indicated. Interim medical history since our last visit reviewed. Allergies and medications reviewed and updated.  Review of Systems  Constitutional: Negative.   Respiratory: Negative.   Cardiovascular: Negative.     Per HPI unless specifically indicated above     Objective:    There were no vitals taken for this visit.  Wt Readings from Last 3 Encounters:  10/31/18 234 lb 12.8 oz (106.5 kg)  09/12/18 233 lb 8 oz (105.9 kg)  07/31/18 235 lb (106.6 kg)    Physical Exam  Results for orders placed or performed in visit on 10/31/18  HIV Antibody (routine testing w rflx)  Result Value Ref Range   HIV Screen 4th Generation wRfx Non Reactive Non Reactive  Insulin-like growth factor  Result Value Ref Range   Insulin-Like GF-1 193 61 - 200 ng/mL  Comprehensive metabolic panel  Result Value Ref Range   Glucose 97 65 - 99 mg/dL   BUN 99 (HH) 6 - 24 mg/dL   Creatinine, Ser 6.35 (H) 0.76 - 1.27 mg/dL   GFR calc non Af Amer 9 (L) >59 mL/min/1.73   GFR calc Af Amer 11 (L) >59 mL/min/1.73   BUN/Creatinine Ratio 16 9 - 20   Sodium 140 134 - 144 mmol/L   Potassium 4.8 3.5 - 5.2 mmol/L   Chloride 108 (H) 96 - 106 mmol/L   CO2 15 (L) 20 - 29 mmol/L   Calcium 8.9 8.7 - 10.2 mg/dL   Total Protein 6.1 6.0 - 8.5 g/dL   Albumin 4.0 3.5 - 5.5 g/dL   Globulin, Total 2.1 1.5 - 4.5 g/dL   Albumin/Globulin Ratio 1.9 1.2 - 2.2   Bilirubin Total <0.2 0.0 - 1.2 mg/dL   Alkaline Phosphatase 77 39 - 117 IU/L   AST 18 0 - 40 IU/L   ALT 22 0 - 44 IU/L  Lipid panel  Result Value Ref Range   Cholesterol, Total 156 100 - 199 mg/dL   Triglycerides 168 (H) 0 - 149 mg/dL   HDL 31 (L) >39 mg/dL   VLDL Cholesterol Cal 34 5 - 40 mg/dL   LDL Calculated 91 0 - 99 mg/dL   Chol/HDL Ratio 5.0 0.0 - 5.0 ratio  CBC with Differential/Platelet  Result Value Ref Range   WBC 7.6 3.4 - 10.8 x10E3/uL   RBC 4.58 4.14 - 5.80 x10E6/uL   Hemoglobin 13.9 13.0 - 17.7 g/dL   Hematocrit 40.8 37.5 - 51.0 %   MCV 89 79 - 97 fL   MCH 30.3 26.6 - 33.0 pg   MCHC 34.1 31.5 - 35.7 g/dL   RDW 13.5 12.3 - 15.4 %   Platelets 143 (L) 150 - 450 x10E3/uL   Neutrophils 66 Not Estab. %   Lymphs 20 Not Estab. %   Monocytes 10 Not Estab. %  Eos 3 Not Estab. %   Basos 1 Not Estab. %   Neutrophils Absolute 5.1 1.4 - 7.0 x10E3/uL   Lymphocytes Absolute 1.5 0.7 - 3.1 x10E3/uL   Monocytes Absolute 0.7 0.1 - 0.9 x10E3/uL   EOS (ABSOLUTE) 0.2 0.0 - 0.4 x10E3/uL   Basophils Absolute 0.0 0.0 - 0.2 x10E3/uL   Immature Granulocytes 0 Not Estab. %   Immature Grans (Abs) 0.0 0.0 - 0.1 x10E3/uL  TSH  Result Value Ref Range   TSH 0.127 (L) 0.450 - 4.500 uIU/mL  Urinalysis, Routine w reflex microscopic  Result Value Ref Range   Specific Gravity, UA 1.015 1.005 - 1.030   pH, UA 5.5 5.0 - 7.5   Color, UA Yellow Yellow   Appearance Ur Clear Clear   Leukocytes, UA Negative Negative   Protein, UA 2+ (A) Negative/Trace   Glucose, UA Negative Negative   Ketones, UA Negative Negative   RBC, UA 2+ (A) Negative   Bilirubin, UA Negative Negative   Urobilinogen, Ur 0.2 0.2 - 1.0 mg/dL   Nitrite, UA Negative Negative  PSA  Result Value Ref Range   Prostate Specific Ag, Serum 0.6 0.0 - 4.0 ng/mL  Uric acid  Result Value Ref  Range   Uric Acid 9.3 (H) 3.7 - 8.6 mg/dL      Assessment & Plan:   Problem List Items Addressed This Visit      Cardiovascular and Mediastinum   Hypertension    Discussed medication and potassium        Endocrine   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       Mild episode of recurrent major depressive disorder [facility])      Telemedicine using audio/video telecommunications for a synchronous communication visit. Today's visit due to COVID-19 isolation precautions I connected with and verified that I am speaking with the correct person using two identifiers.   I discussed the limitations, risks, security and privacy concerns of performing an evaluation and management service by telecommunication and the availability of in person appointments. I also discussed with the patient that there may be a patient responsible charge related to this service. The patient expressed understanding and agreed to proceed. The patient's location is home. I am at home.   I discussed the assessment and treatment plan with the patient. The patient was provided an opportunity to ask questions and all were answered. The patient agreed with the plan and demonstrated an understanding of the instructions.   The patient was advised to call back or seek an in-person evaluation if the symptoms worsen or if the condition fails to improve as anticipated.   I provided 21+ minutes of time during this encounter. Follow up plan: Return in about 4 weeks (around 06/26/2019).

## 2019-05-29 NOTE — Assessment & Plan Note (Signed)
The current medical regimen is effective;  continue present plan and medications.  

## 2019-06-12 ENCOUNTER — Ambulatory Visit
Admission: RE | Admit: 2019-06-12 | Discharge: 2019-06-12 | Disposition: A | Discharge status: Home / Self Care | Payer: PRIVATE HEALTH INSURANCE | Attending: Nephrology | Admitting: Nephrology

## 2019-06-12 ENCOUNTER — Ambulatory Visit
Admission: RE | Admit: 2019-06-12 | Discharge: 2019-06-12 | Disposition: A | Discharge status: Home / Self Care | Payer: PRIVATE HEALTH INSURANCE | Source: Ambulatory Visit | Attending: Nephrology | Admitting: Nephrology

## 2019-06-12 ENCOUNTER — Other Ambulatory Visit: Payer: Self-pay | Admitting: Nephrology

## 2019-06-12 ENCOUNTER — Other Ambulatory Visit: Payer: Self-pay

## 2019-06-12 DIAGNOSIS — N186 End stage renal disease: Secondary | ICD-10-CM | POA: Insufficient documentation

## 2019-07-02 ENCOUNTER — Other Ambulatory Visit: Payer: Self-pay

## 2019-07-02 DIAGNOSIS — N184 Chronic kidney disease, stage 4 (severe): Secondary | ICD-10-CM

## 2019-07-03 ENCOUNTER — Encounter: Payer: Self-pay | Admitting: Family Medicine

## 2019-07-03 ENCOUNTER — Ambulatory Visit (INDEPENDENT_AMBULATORY_CARE_PROVIDER_SITE_OTHER): Payer: PRIVATE HEALTH INSURANCE | Admitting: Vascular Surgery

## 2019-07-03 ENCOUNTER — Ambulatory Visit (HOSPITAL_COMMUNITY)
Admission: RE | Admit: 2019-07-03 | Discharge: 2019-07-03 | Disposition: A | Discharge status: Home / Self Care | Payer: PRIVATE HEALTH INSURANCE | Source: Ambulatory Visit | Attending: Vascular Surgery | Admitting: Vascular Surgery

## 2019-07-03 ENCOUNTER — Ambulatory Visit (HOSPITAL_COMMUNITY)
Admission: RE | Admit: 2019-07-03 | Discharge: 2019-07-03 | Disposition: A | Discharge status: Home / Self Care | Payer: PRIVATE HEALTH INSURANCE | Source: Ambulatory Visit | Attending: Family | Admitting: Family

## 2019-07-03 ENCOUNTER — Encounter: Payer: Self-pay | Admitting: Vascular Surgery

## 2019-07-03 ENCOUNTER — Ambulatory Visit (INDEPENDENT_AMBULATORY_CARE_PROVIDER_SITE_OTHER): Payer: PRIVATE HEALTH INSURANCE | Admitting: Family Medicine

## 2019-07-03 ENCOUNTER — Other Ambulatory Visit: Payer: Self-pay

## 2019-07-03 ENCOUNTER — Encounter (HOSPITAL_COMMUNITY): Payer: Self-pay

## 2019-07-03 VITALS — BP 134/81 | HR 80 | Temp 98.0°F | Resp 14 | Ht 69.0 in | Wt 233.6 lb

## 2019-07-03 DIAGNOSIS — N186 End stage renal disease: Secondary | ICD-10-CM

## 2019-07-03 DIAGNOSIS — F419 Anxiety disorder, unspecified: Secondary | ICD-10-CM

## 2019-07-03 DIAGNOSIS — F339 Major depressive disorder, recurrent, unspecified: Secondary | ICD-10-CM

## 2019-07-03 DIAGNOSIS — Z992 Dependence on renal dialysis: Secondary | ICD-10-CM | POA: Diagnosis not present

## 2019-07-03 DIAGNOSIS — N185 Chronic kidney disease, stage 5: Secondary | ICD-10-CM

## 2019-07-03 DIAGNOSIS — F32A Depression, unspecified: Secondary | ICD-10-CM

## 2019-07-03 DIAGNOSIS — F329 Major depressive disorder, single episode, unspecified: Secondary | ICD-10-CM | POA: Diagnosis not present

## 2019-07-03 DIAGNOSIS — N184 Chronic kidney disease, stage 4 (severe): Secondary | ICD-10-CM

## 2019-07-03 DIAGNOSIS — I120 Hypertensive chronic kidney disease with stage 5 chronic kidney disease or end stage renal disease: Secondary | ICD-10-CM

## 2019-07-03 MED ORDER — ALPRAZOLAM 1 MG PO TABS: ORAL_TABLET | ORAL | 5 refills | Status: DC

## 2019-07-03 NOTE — Progress Notes (Signed)
Patient name: Dylan Chandler MRN: [id number] DOB: 1966/10/06 Sex: male  REASON FOR VISIT:   Poorly functioning hemodialysis access.  The consult is requested by [facility].   HPI:   Dylan Chandler is a pleasant 53 y.o. male who had a right radiocephalic fistula placed a year ago.  This was on 07/31/2018.  He began dialysis a month ago and initially the fistula was working well.  He then had an infiltrate a couple of weeks ago.  Then last Monday which was a week ago he accidentally moved his arm and had another infiltrate.  He comes in to have this fistula evaluated.  He was scheduled for vein mapping and arterial study however he refused to have noninvasive studies in the office today.  He has no specific complaints referrable to the fistula.  Past Medical History:  Diagnosis Date   Anxiety    CAD (coronary artery disease)    Cardiac murmur    Depression    Elevated SGOT    Hyperlipidemia    Hypertension    Hypogonadotropic hypogonadism 1 (HCC)    Hypothyroidism    Myocardial infarction ([place])    Sleep apnea    Thyroid disease     Family History  Problem Relation Age of Onset   Cancer Mother    Hyperlipidemia Mother    Cancer Father    Hyperlipidemia Father    AAA (abdominal aortic aneurysm) Father     SOCIAL HISTORY: Social History   Tobacco Use   Smoking status: Never Smoker   Smokeless tobacco: Never Used  Substance Use Topics   Alcohol use: No    Allergies  Allergen Reactions   Amlodipine Swelling   Metoprolol Tartrate Other (See Comments)    fatague    Current Outpatient Medications  Medication Sig Dispense Refill   ALPRAZolam (XANAX) 1 MG tablet Take 1 in AM 1/2 at 4pm and 1 bedtime 75 tablet 5   aspirin EC 81 MG tablet Take 243 mg by mouth at bedtime.     Cholecalciferol (VITAMIN D3) 125 MCG (5000 UT) CAPS Take by mouth.     liothyronine (CYTOMEL) 25 MCG tablet TAKE 3 TABLETS BY MOUTH EVERY DAY (Patient taking  differently: Take 2 tablets by mouth daily) 270 tablet 0   loratadine (CLARITIN) 10 MG tablet Take 10 mg by mouth daily as needed for allergies.     nefazodone (SERZONE) 150 MG tablet Take 1 tablet (150 mg total) by mouth 2 (two) times daily. 60 tablet 14   nitroGLYCERIN (NITROSTAT) 0.4 MG SL tablet PLACE ONE TABLET UNDER THE TONGUE AS NEEDED FOR CHEST PAIN 25 tablet 6   ramipril (ALTACE) 10 MG capsule Take 1 capsule (10 mg total) by mouth 2 (two) times daily. 180 capsule 4   rosuvastatin (CRESTOR) 5 MG tablet Take 1 tablet (5 mg total) by mouth daily. 90 tablet 4   sertraline (ZOLOFT) 100 MG tablet TAKE 2 TABLETS (200 MG TOTAL) BY MOUTH 2 (TWO) TIMES DAILY. 360 tablet 1   sildenafil (REVATIO) 20 MG tablet Take 1 tablet (20 mg total) by mouth daily as needed. 50 tablet 12   sodium bicarbonate 650 MG tablet Take 1,300 mg by mouth 2 (two) times daily.     testosterone cypionate (DEPOTESTOSTERONE CYPIONATE) 200 MG/ML injection INJECT 0.5ML INTO THE MUSCLE ONCE A WEEK 6 mL 5   No current facility-administered medications for this visit.     REVIEW OF SYSTEMS:  [X]  denotes positive finding, [ ]  denotes  negative finding Cardiac  Comments:  Chest pain or chest pressure:    Shortness of breath upon exertion:    Short of breath when lying flat:    Irregular heart rhythm:        Vascular    Pain in calf, thigh, or hip brought on by ambulation:    Pain in feet at night that wakes you up from your sleep:     Blood clot in your veins:    Leg swelling:         Pulmonary    Oxygen at home:    Productive cough:     Wheezing:         Neurologic    Sudden weakness in arms or legs:     Sudden numbness in arms or legs:     Sudden onset of difficulty speaking or slurred speech:    Temporary loss of vision in one eye:     Problems with dizziness:         Gastrointestinal    Blood in stool:     Vomited blood:         Genitourinary    Burning when urinating:     Blood in urine:          Psychiatric    Major depression:         Hematologic    Bleeding problems:    Problems with blood clotting too easily:        Skin    Rashes or ulcers:        Constitutional    Fever or chills:     PHYSICAL EXAM:   Vitals:   07/03/19 1439  BP: 134/81  Pulse: 80  Resp: 14  Temp: 98 F (36.7 C)  TempSrc: Temporal  SpO2: 98%  Weight: 233 lb 9.6 oz (106 kg)  Height: 5\' 9"  (1.753 m)    GENERAL: The patient is a well-nourished male, in no acute distress. The vital signs are documented above. CARDIAC: There is a regular rate and rhythm.  VASCULAR: He has an excellent thrill in the fistula in the distal forearm. The right hand is warm and well-perfused. PULMONARY: There is good air exchange bilaterally without wheezing or rales. ABDOMEN: Soft and non-tender with normal pitched bowel sounds.  MUSCULOSKELETAL: There are no major deformities or cyanosis. NEUROLOGIC: No focal weakness or paresthesias are detected. SKIN: There are no ulcers or rashes noted. PSYCHIATRIC: The patient has a normal affect.  DATA:    No new data.  MEDICAL ISSUES:   STATUS POST RIGHT RADIOCEPHALIC AV FISTULA: The fistula appears to be working well despite the fact that he is had 2 infiltrates.  There appears to be adequate room to still cannulate the fistula.  I explained that if they have problems accessing the fistula the best option would be to place a catheter in order to give the swelling and infiltrate more time to resolve.  If there was still having problems we could potentially do a fistulogram.  However currently there appears to be adequate room to access the fistula with 2 needles.  In addition, he is very much opposed to even considering a temporary catheter.  I will be happy to see him back if he has further problems accessing his fistula.  [name] [facility] [phone]

## 2019-07-03 NOTE — Assessment & Plan Note (Signed)
Reviewed anxiety use of medications will give prescription for 1-1/2 tablets a day.

## 2019-07-03 NOTE — Assessment & Plan Note (Signed)
Will review with nephrology about medication and stability are need to change dosing with dialysis along with anxiety medication.

## 2019-07-03 NOTE — Progress Notes (Signed)
There were no vitals taken for this visit.   Subjective:    Patient ID: Dylan Chandler, male    DOB: 1966/03/05, 53 y.o.   MRN: [id number]  HPI: Dylan Chandler is a 53 y.o. male  Med check Discussed with patient started dialysis and doing well not sure if alprazolam is getting dialyzed off but is taking an extra half a tablet in the late afternoon which seems to help a great deal.  We will go ahead and give patient a prescription for increased dosage of Xanax. Blood pressure in dialysis doing okay had problems with his shunt with infiltration which is being checked. Depression otherwise stable.  Relevant past medical, surgical, family and social history reviewed and updated as indicated. Interim medical history since our last visit reviewed. Allergies and medications reviewed and updated.  Review of Systems  Constitutional: Negative.   Respiratory: Negative.   Cardiovascular: Negative.     Per HPI unless specifically indicated above     Objective:    There were no vitals taken for this visit.  Wt Readings from Last 3 Encounters:  10/31/18 234 lb 12.8 oz (106.5 kg)  09/12/18 233 lb 8 oz (105.9 kg)  07/31/18 235 lb (106.6 kg)    Physical Exam  Results for orders placed or performed in visit on 10/31/18  HIV Antibody (routine testing w rflx)  Result Value Ref Range   HIV Screen 4th Generation wRfx Non Reactive Non Reactive  Insulin-like growth factor  Result Value Ref Range   Insulin-Like GF-1 193 61 - 200 ng/mL  Comprehensive metabolic panel  Result Value Ref Range   Glucose 97 65 - 99 mg/dL   BUN 99 (HH) 6 - 24 mg/dL   Creatinine, Ser 6.35 (H) 0.76 - 1.27 mg/dL   GFR calc non Af Amer 9 (L) >59 mL/min/1.73   GFR calc Af Amer 11 (L) >59 mL/min/1.73   BUN/Creatinine Ratio 16 9 - 20   Sodium 140 134 - 144 mmol/L   Potassium 4.8 3.5 - 5.2 mmol/L   Chloride 108 (H) 96 - 106 mmol/L   CO2 15 (L) 20 - 29 mmol/L   Calcium 8.9 8.7 - 10.2 mg/dL   Total Protein 6.1  6.0 - 8.5 g/dL   Albumin 4.0 3.5 - 5.5 g/dL   Globulin, Total 2.1 1.5 - 4.5 g/dL   Albumin/Globulin Ratio 1.9 1.2 - 2.2   Bilirubin Total <0.2 0.0 - 1.2 mg/dL   Alkaline Phosphatase 77 39 - 117 IU/L   AST 18 0 - 40 IU/L   ALT 22 0 - 44 IU/L  Lipid panel  Result Value Ref Range   Cholesterol, Total 156 100 - 199 mg/dL   Triglycerides 168 (H) 0 - 149 mg/dL   HDL 31 (L) >39 mg/dL   VLDL Cholesterol Cal 34 5 - 40 mg/dL   LDL Calculated 91 0 - 99 mg/dL   Chol/HDL Ratio 5.0 0.0 - 5.0 ratio  CBC with Differential/Platelet  Result Value Ref Range   WBC 7.6 3.4 - 10.8 x10E3/uL   RBC 4.58 4.14 - 5.80 x10E6/uL   Hemoglobin 13.9 13.0 - 17.7 g/dL   Hematocrit 40.8 37.5 - 51.0 %   MCV 89 79 - 97 fL   MCH 30.3 26.6 - 33.0 pg   MCHC 34.1 31.5 - 35.7 g/dL   RDW 13.5 12.3 - 15.4 %   Platelets 143 (L) 150 - 450 x10E3/uL   Neutrophils 66 Not Estab. %   Lymphs 20  Not Estab. %   Monocytes 10 Not Estab. %   Eos 3 Not Estab. %   Basos 1 Not Estab. %   Neutrophils Absolute 5.1 1.4 - 7.0 x10E3/uL   Lymphocytes Absolute 1.5 0.7 - 3.1 x10E3/uL   Monocytes Absolute 0.7 0.1 - 0.9 x10E3/uL   EOS (ABSOLUTE) 0.2 0.0 - 0.4 x10E3/uL   Basophils Absolute 0.0 0.0 - 0.2 x10E3/uL   Immature Granulocytes 0 Not Estab. %   Immature Grans (Abs) 0.0 0.0 - 0.1 x10E3/uL  TSH  Result Value Ref Range   TSH 0.127 (L) 0.450 - 4.500 uIU/mL  Urinalysis, Routine w reflex microscopic  Result Value Ref Range   Specific Gravity, UA 1.015 1.005 - 1.030   pH, UA 5.5 5.0 - 7.5   Color, UA Yellow Yellow   Appearance Ur Clear Clear   Leukocytes, UA Negative Negative   Protein, UA 2+ (A) Negative/Trace   Glucose, UA Negative Negative   Ketones, UA Negative Negative   RBC, UA 2+ (A) Negative   Bilirubin, UA Negative Negative   Urobilinogen, Ur 0.2 0.2 - 1.0 mg/dL   Nitrite, UA Negative Negative  PSA  Result Value Ref Range   Prostate Specific Ag, Serum 0.6 0.0 - 4.0 ng/mL  Uric acid  Result Value Ref Range   Uric Acid  9.3 (H) 3.7 - 8.6 mg/dL      Assessment & Plan:   Problem List Items Addressed This Visit      Genitourinary   CKD stage 5 secondary to hypertension ([place])    Stable blood pressure on dialysis        Other   Depression, recurrent ([place])    Will review with nephrology about medication and stability are need to change dosing with dialysis along with anxiety medication.      Relevant Medications   ALPRAZolam (XANAX) 1 MG tablet   Anxiety    Reviewed anxiety use of medications will give prescription for 1-1/2 tablets a day.      Relevant Medications   ALPRAZolam (XANAX) 1 MG tablet   Dialysis patient [facility])    On dialysis for CKD       Other Visit Diagnoses    Depression, unspecified depression type       Relevant Medications   ALPRAZolam (XANAX) 1 MG tablet       Follow up plan: Return in about 6 months (around 01/03/2020) for Physical Exam.

## 2019-07-03 NOTE — Assessment & Plan Note (Signed)
On dialysis for CKD

## 2019-07-03 NOTE — Assessment & Plan Note (Signed)
Stable blood pressure on dialysis

## 2019-07-24 ENCOUNTER — Other Ambulatory Visit: Payer: Self-pay | Admitting: Family Medicine

## 2019-07-24 DIAGNOSIS — E230 Hypopituitarism: Secondary | ICD-10-CM

## 2019-07-24 NOTE — Telephone Encounter (Signed)
Requested medication (s) are due for refill today: yes  Requested medication (s) are on the active medication list: yes  Last refill:  04/29/2019  Future visit scheduled: yes  Notes to clinic: ordering provider and pcp are different    Requested Prescriptions  Pending Prescriptions Disp Refills   liothyronine (CYTOMEL) 25 MCG tablet [Pharmacy Med Name: LIOTHYRONINE SOD 25 MCG TAB] 270 tablet 0    Sig: TAKE 3 TABLETS BY MOUTH EVERY DAY     Endocrinology:  Hypothyroid Agents Failed - 07/24/2019  1:33 AM      Failed - TSH needs to be rechecked within 3 months after an abnormal result. Refill until TSH is due.      Failed - TSH in normal range and within 360 days    TSH  Date Value Ref Range Status  10/31/2018 0.127 (L) 0.450 - 4.500 uIU/mL Final         Passed - Valid encounter within last 12 months    Recent Outpatient Visits          3 weeks ago Depression, unspecified depression type   [facility], Jeannette How, MD   1 month ago Mild episode of recurrent major depressive disorder ([place])   [facility] Crissman, Jeannette How, MD   8 months ago Encounter for screening for HIV   [place] Crissman, Jeannette How, MD   1 year ago Essential hypertension   [place], Jeannette How, MD   1 year ago Essential hypertension   [place], Jeannette How, MD      Future Appointments            In 3 months Johnson, Barb Merino, DO [employer], [place]

## 2019-07-24 NOTE — Telephone Encounter (Signed)
Routing to provider, see PEC message below.

## 2019-07-30 ENCOUNTER — Other Ambulatory Visit
Admission: RE | Admit: 2019-07-30 | Discharge: 2019-07-30 | Disposition: A | Discharge status: Home / Self Care | Payer: PRIVATE HEALTH INSURANCE | Source: Ambulatory Visit | Attending: Nephrology | Admitting: Nephrology

## 2019-07-31 ENCOUNTER — Other Ambulatory Visit
Admission: RE | Admit: 2019-07-31 | Discharge: 2019-07-31 | Disposition: A | Discharge status: Home / Self Care | Payer: PRIVATE HEALTH INSURANCE | Source: Ambulatory Visit | Attending: Nephrology | Admitting: Nephrology

## 2019-07-31 DIAGNOSIS — E875 Hyperkalemia: Secondary | ICD-10-CM | POA: Diagnosis not present

## 2019-07-31 LAB — POTASSIUM: Potassium: 5.5 mmol/L — ABNORMAL HIGH (ref 3.5–5.1)

## 2019-08-19 ENCOUNTER — Other Ambulatory Visit: Payer: Self-pay

## 2019-08-19 DIAGNOSIS — Z20828 Contact with and (suspected) exposure to other viral communicable diseases: Secondary | ICD-10-CM

## 2019-08-19 DIAGNOSIS — Z20822 Contact with and (suspected) exposure to covid-19: Secondary | ICD-10-CM

## 2019-08-20 LAB — NOVEL CORONAVIRUS, NAA: SARS-CoV-2, NAA: NOT DETECTED

## 2019-08-26 ENCOUNTER — Other Ambulatory Visit
Admission: RE | Admit: 2019-08-26 | Discharge: 2019-08-26 | Disposition: A | Discharge status: Home / Self Care | Payer: PRIVATE HEALTH INSURANCE | Source: Ambulatory Visit | Attending: Family Medicine | Admitting: Family Medicine

## 2019-08-26 ENCOUNTER — Other Ambulatory Visit
Admission: RE | Admit: 2019-08-26 | Discharge: 2019-08-26 | Disposition: A | Discharge status: Home / Self Care | Payer: PRIVATE HEALTH INSURANCE | Source: Ambulatory Visit | Attending: Nephrology | Admitting: Nephrology

## 2019-08-26 ENCOUNTER — Other Ambulatory Visit: Payer: Self-pay | Admitting: Nephrology

## 2019-08-26 DIAGNOSIS — E230 Hypopituitarism: Secondary | ICD-10-CM | POA: Diagnosis present

## 2019-08-26 DIAGNOSIS — N186 End stage renal disease: Secondary | ICD-10-CM

## 2019-08-26 DIAGNOSIS — E875 Hyperkalemia: Secondary | ICD-10-CM

## 2019-08-26 LAB — RENAL FUNCTION PANEL
Albumin: 3.8 g/dL (ref 3.5–5.0)
Anion gap: 9 (ref 5–15)
BUN: 118 mg/dL — ABNORMAL HIGH (ref 6–20)
CO2: 21 mmol/L — ABNORMAL LOW (ref 22–32)
Calcium: 8.8 mg/dL — ABNORMAL LOW (ref 8.9–10.3)
Chloride: 107 mmol/L (ref 98–111)
Creatinine, Ser: 11.2 mg/dL — ABNORMAL HIGH (ref 0.61–1.24)
GFR calc Af Amer: 5 mL/min — ABNORMAL LOW (ref >60)
GFR calc non Af Amer: 5 mL/min — ABNORMAL LOW (ref >60)
Glucose, Bld: 100 mg/dL — ABNORMAL HIGH (ref 70–99)
Phosphorus: 8.6 mg/dL — ABNORMAL HIGH (ref 2.5–4.6)
Potassium: 5.7 mmol/L — ABNORMAL HIGH (ref 3.5–5.1)
Sodium: 137 mmol/L (ref 135–145)

## 2019-08-26 NOTE — Addendum Note (Signed)
Addended by: Santiago Bur on: 08/26/2019 11:50 AM   Modules accepted: Orders

## 2019-08-27 ENCOUNTER — Encounter: Payer: Self-pay | Admitting: Family Medicine

## 2019-08-27 LAB — TESTOSTERONE: Testosterone: 722 ng/dL (ref 264–916)

## 2019-08-28 ENCOUNTER — Other Ambulatory Visit: Payer: Self-pay | Admitting: *Deleted

## 2019-08-28 ENCOUNTER — Telehealth: Payer: Self-pay | Admitting: *Deleted

## 2019-08-28 DIAGNOSIS — Z992 Dependence on renal dialysis: Secondary | ICD-10-CM

## 2019-08-28 DIAGNOSIS — N186 End stage renal disease: Secondary | ICD-10-CM

## 2019-08-28 NOTE — Telephone Encounter (Signed)
Patient called wanting an appointment with Dr. Scot Dock due to continued infiltrations of HD access "according to patient". Patient refused non-invasive studies at last office visit with Dr. Scot Dock and was told then he would need [facility]  If future problems. I informed patient of appt time and that non-invasive studies would need to be done at this visit. I instructed him to call [facility] to make a plan for HD today.  Spoke with BRANDY at [employer]. Patient is non-compliant. Will not allow even a blood draw for labs. Did not show up for HD the entire month of September. According to records no problem with HD on 08/16/2019. She is going to call patient and let him know she has been in touch with this office. She would like to be called at appointment with patient 08/29/2019. Number on chart records for provider tomorrow.

## 2019-08-29 ENCOUNTER — Other Ambulatory Visit (HOSPITAL_COMMUNITY)
Admission: RE | Admit: 2019-08-29 | Discharge: 2019-08-29 | Disposition: A | Discharge status: Home / Self Care | Payer: PRIVATE HEALTH INSURANCE | Source: Ambulatory Visit | Attending: Vascular Surgery | Admitting: Vascular Surgery

## 2019-08-29 ENCOUNTER — Encounter: Payer: Self-pay | Admitting: *Deleted

## 2019-08-29 ENCOUNTER — Ambulatory Visit (HOSPITAL_COMMUNITY)
Admission: RE | Admit: 2019-08-29 | Discharge: 2019-08-29 | Disposition: A | Discharge status: Home / Self Care | Payer: PRIVATE HEALTH INSURANCE | Source: Ambulatory Visit | Attending: Family | Admitting: Family

## 2019-08-29 ENCOUNTER — Other Ambulatory Visit: Payer: Self-pay

## 2019-08-29 ENCOUNTER — Ambulatory Visit (INDEPENDENT_AMBULATORY_CARE_PROVIDER_SITE_OTHER): Payer: PRIVATE HEALTH INSURANCE | Admitting: Physician Assistant

## 2019-08-29 VITALS — BP 140/85 | HR 74 | Temp 97.9°F | Resp 14 | Ht 69.0 in | Wt 235.6 lb

## 2019-08-29 DIAGNOSIS — Z992 Dependence on renal dialysis: Secondary | ICD-10-CM

## 2019-08-29 DIAGNOSIS — N186 End stage renal disease: Secondary | ICD-10-CM

## 2019-08-29 DIAGNOSIS — Z20828 Contact with and (suspected) exposure to other viral communicable diseases: Secondary | ICD-10-CM | POA: Insufficient documentation

## 2019-08-29 LAB — SARS CORONAVIRUS 2 (TAT 6-24 HRS): SARS Coronavirus 2: NEGATIVE

## 2019-08-29 NOTE — H&P (View-Only) (Signed)
HISTORY AND PHYSICAL     CC:  dialysis access Requesting Provider:  Guadalupe Maple, MD  HPI: This is a 53 y.o. male here for evaluation of his hemodialysis access.  He underwent a right RC AVF on 07/31/18 by Dr. Scot Dock.  He recently went on dialysis.  He was seen in August by Dr. Scot Dock and at that time was supposed to have vein mapping and arterial study but refused them.  He was having infiltration problems then, but at that time, there appeared to be adequate room to access the fistula.  Pt was very much opposed to even considering a temporary catheter.   He presents today for evaluation of his fistula.  He was seen at [facility] last week for transplant workup per [facility].  Dr. Theda Sers was concerned that the pt may have a central venous stenosis as he has swelling in his right arm.   He states that he was started on Torsemide on Monday.   He states that sometimes they have difficulty accessing his fistula.  He states that if they stick it in certain places, it works better than other times.  He states that sometimes it clots and sometimes it runs fine.  He states he has not had HD since 10/2.  I spoke with Theadora Rama and the HD center and he has not had dialysis regularly in several weeks.    The pt is left hand dominant.   Pt is on dialysis in [place] at [facility].   The pt is on a statin for cholesterol management.  The pt is not diabetic.   The pt is on ACEI for hypertension.   Tobacco hx:  never The pt is on a daily aspirin. Other AC:  none  Past Medical History:  Diagnosis Date  . Anxiety   . CAD (coronary artery disease)   . Cardiac murmur   . Depression   . Elevated SGOT   . Hyperlipidemia   . Hypertension   . 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 Hypothyroidism   . Myocardial infarction ([place])   . Sleep apnea   . Thyroid disease     Past Surgical History:  Procedure Laterality Date  . AV FISTULA PLACEMENT Right 07/31/2018   Procedure: Right Radiocephalic  ARTERIOVENOUS (AV) FISTULA CREATION;  Surgeon: Angelia Mould, MD;  Location: [facility];  Service: Vascular;  Laterality: Right;  . CARDIAC CATHETERIZATION N/A 06/09/2016   Procedure: Left Heart Cath and Coronary Angiography;  Surgeon: Burnell Blanks, MD;  Location: [facility];  Service: Cardiovascular;  Laterality: N/A;  . CARDIAC CATHETERIZATION N/A 06/09/2016   Procedure: Coronary Stent Intervention;  Surgeon: Burnell Blanks, MD;  Location: [place] [facility];  Service: Cardiovascular;  Laterality: N/A;  . KNEE ARTHROSCOPY Left   . LASER ABLATION Right    leg  . phlebectomy Right    leg  . SHOULDER SURGERY Right     Allergies  Allergen Reactions  . Amlodipine Swelling  . Metoprolol Tartrate Other (See Comments)    fatague    Current Outpatient Medications  Medication Sig Dispense Refill  . ALPRAZolam (XANAX) 1 MG tablet Take 1 in AM 1/2 at 4pm and 1 bedtime 75 tablet 5  . aspirin EC 81 MG tablet Take 243 mg by mouth at bedtime.    . Cholecalciferol (VITAMIN D3) 125 MCG (5000 UT) CAPS Take by mouth.    . liothyronine (CYTOMEL) 25 MCG tablet TAKE 3 TABLETS BY MOUTH EVERY DAY 270  tablet 0  . loratadine (CLARITIN) 10 MG tablet Take 10 mg by mouth daily as needed for allergies.    . nefazodone (SERZONE) 150 MG tablet Take 1 tablet (150 mg total) by mouth 2 (two) times daily. 60 tablet 14  . nitroGLYCERIN (NITROSTAT) 0.4 MG SL tablet PLACE ONE TABLET UNDER THE TONGUE AS NEEDED FOR CHEST PAIN 25 tablet 6  . ramipril (ALTACE) 10 MG capsule Take 1 capsule (10 mg total) by mouth 2 (two) times daily. 180 capsule 4  . rosuvastatin (CRESTOR) 5 MG tablet Take 1 tablet (5 mg total) by mouth daily. 90 tablet 4  . sertraline (ZOLOFT) 100 MG tablet TAKE 2 TABLETS (200 MG TOTAL) BY MOUTH 2 (TWO) TIMES DAILY. 360 tablet 1  . sildenafil (REVATIO) 20 MG tablet Take 1 tablet (20 mg total) by mouth daily as needed. 50 tablet 12  . sodium bicarbonate 650 MG tablet Take 1,300 mg  by mouth 2 (two) times daily.    Marland Kitchen testosterone cypionate (DEPOTESTOSTERONE CYPIONATE) 200 MG/ML injection INJECT 0.5ML INTO THE MUSCLE ONCE A WEEK 6 mL 5   No current facility-administered medications for this visit.     Family History  Problem Relation Age of Onset  . Cancer Mother   . Hyperlipidemia Mother   . Cancer Father   . Hyperlipidemia Father   . AAA (abdominal aortic aneurysm) Father     Social History   Socioeconomic History  . Marital status: Married    Spouse name: Not on file  . Number of children: Not on file  . Years of education: Not on file  . Highest education level: Not on file  Occupational History  . Not on file  Social Needs  . Financial resource strain: Not hard at all  . Food insecurity    Worry: Patient refused    Inability: Patient refused  . Transportation needs    Medical: Patient refused    Non-medical: Patient refused  Tobacco Use  . Smoking status: Never Smoker  . Smokeless tobacco: Never Used  Substance and Sexual Activity  . Alcohol use: No  . Drug use: No  . Sexual activity: Not on file  Lifestyle  . Physical activity    Days per week: 5 days    Minutes per session: 60 min  . Stress: Not at all  Relationships  . Social Herbalist on phone: Patient refused    Gets together: Patient refused    Attends religious service: Patient refused    Active member of club or organization: Patient refused    Attends meetings of clubs or organizations: Patient refused    Relationship status: Patient refused  . Intimate partner violence    Fear of current or ex partner: Patient refused    Emotionally abused: Patient refused    Physically abused: Patient refused    Forced sexual activity: Patient refused  Other Topics Concern  . Not on file  Social History Narrative  . Not on file     ROS: [x]  Positive   [ ]  Negative   [ ]  All sytems reviewed and are negative  Cardiac: [x]  high blood pressure   Vascular: []  denies leg  swelling  [x]  right arm swelling   Pulmonary: [x]  OSA   Neurologic: []  weakness in []  arms []  legs []  numbness in []  arms []  legs [] difficulty speaking or slurred speech []  temporary loss of vision in one eye []  dizziness  Hematologic: []  bleeding problems  GI []   GERD  GU: [x]  CKD/renal failure  [x]  HD []  burning with urination []  blood in urine  Psychiatric: [x]  anxiety   Integumentary: []  rashes []  ulcers  Constitutional: []  fever []  chills  PHYSICAL EXAMINATION:  Today's Vitals   08/29/19 1342  BP: 140/85  Pulse: 74  Resp: 14  Temp: 97.9 F (36.6 C)  TempSrc: Temporal  SpO2: 98%  Weight: 235 lb 9.6 oz (106.9 kg)  Height: 5\' 9"  (1.753 m)  PainSc: 4    Body mass index is 34.79 kg/m.    General:  WDWN male in NAD Gait: Not observed HENT: WNL Pulmonary: normal non-labored breathing , without Rales, rhonchi,  wheezing Cardiac: regular, without  Murmurs without carotid bruits Abdomen: soft, NT, no masses Skin: without rashes, without ulcers  Vascular Exam/Pulses:  +thrill and audible bruit in fistula but decreases proximally Extremities:  without ischemic changes, without Gangrene, without cellulitis; without open wounds; swelling right arm with mild pitting edema right hand.  He does not have any leg swelling bilaterally Musculoskeletal: no muscle wasting or atrophy  Neurologic: A&O X 3; Moving all extremities equally;  Speech is fluent/normal  Non-Invasive Vascular Imaging:   Dialysis duplex 08/29/2019: Findings: +--------------------+----------+-----------------+--------+ AVF                 PSV (cm/s)Flow Vol (mL/min)Comments +--------------------+----------+-----------------+--------+ Native artery inflow   170          1083                +--------------------+----------+-----------------+--------+ AVF Anastomosis        512                              +--------------------+----------+-----------------+--------+     +------------+----------+-------------+----------+-----------------------------+ OUTFLOW VEINPSV (cm/s)Diameter (cm)Depth (cm)          Describe            +------------+----------+-------------+----------+-----------------------------+ Dist UA         21        0.57        0.24          Retained valve         +------------+----------+-------------+----------+-----------------------------+ AC Fossa       136        0.67        0.30   Retained valve and competing                                                          branch             +------------+----------+-------------+----------+-----------------------------+ Prox Forearm   128        0.64        0.37         competing branch        +------------+----------+-------------+----------+-----------------------------+ Mid Forearm    160        0.70        0.42   competing branch and Retained  valve             +------------+----------+-------------+----------+-----------------------------+ Dist Forearm   101        0.79        0.33         competing branch        +------------+----------+-------------+----------+-----------------------------+   Summary: Patent arteriovenous fistula with several retained valves and multiple competing branches as listed above. Velocities less than 100cm/s noted in the distal upper arm.   ASSESSMENT/PLAN: 53 y.o. male with hx of right RC AVF and is here for evaluation of his hemodialysis access and right arm swelling  -pt with persistent arm swelling in right arm with right RC AVF.  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  He has velocities < 100cm/s in the distal upper arm.  He will need a fistulogram.    -His most recent K+ was 5.7 on Monday.  Pt opposed to getting HD catheter unless absolutely necessary.  Discussed with Dr. Oneida Alar and if K+ is around 5.7 on day of procedure, we can proceed.  Pt understands  that if his K+ is much higher than that, his procedure will be cancelled.  He understands that he may need a catheter in the future for HD as hyperkalemia is life threatening, which I discussed with him. 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   -will plan for fistulogram tomorrow afternoon, 08/30/2019 of the right arm fistula by Dr. Scot Dock.   -called Theadora Rama at HD center to discuss plan.    Leontine Locket, PA-C [facility] [phone]  Clinic MD:   Scot Dock

## 2019-08-29 NOTE — Progress Notes (Signed)
HISTORY AND PHYSICAL     CC:  dialysis access Requesting Provider:  Guadalupe Maple, MD  HPI: This is a 53 y.o. male here for evaluation of his hemodialysis access.  He underwent a right RC AVF on 07/31/18 by Dr. Scot Dock.  He recently went on dialysis.  He was seen in August by Dr. Scot Dock and at that time was supposed to have vein mapping and arterial study but refused them.  He was having infiltration problems then, but at that time, there appeared to be adequate room to access the fistula.  Pt was very much opposed to even considering a temporary catheter.   He presents today for evaluation of his fistula.  He was seen at [facility] last week for transplant workup per [facility].  Dr. Theda Sers was concerned that the pt may have a central venous stenosis as he has swelling in his right arm.   He states that he was started on Torsemide on Monday.   He states that sometimes they have difficulty accessing his fistula.  He states that if they stick it in certain places, it works better than other times.  He states that sometimes it clots and sometimes it runs fine.  He states he has not had HD since 10/2.  I spoke with Theadora Rama and the HD center and he has not had dialysis regularly in several weeks.    The pt is left hand dominant.   Pt is on dialysis in [place] at [facility].   The pt is on a statin for cholesterol management.  The pt is not diabetic.   The pt is on ACEI for hypertension.   Tobacco hx:  never The pt is on a daily aspirin. Other AC:  none  Past Medical History:  Diagnosis Date  . Anxiety   . CAD (coronary artery disease)   . Cardiac murmur   . Depression   . Elevated SGOT   . Hyperlipidemia   . Hypertension   . 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 Hypothyroidism   . Myocardial infarction ([place])   . Sleep apnea   . Thyroid disease     Past Surgical History:  Procedure Laterality Date  . AV FISTULA PLACEMENT Right 07/31/2018   Procedure: Right Radiocephalic  ARTERIOVENOUS (AV) FISTULA CREATION;  Surgeon: Angelia Mould, MD;  Location: [place];  Service: Vascular;  Laterality: Right;  . CARDIAC CATHETERIZATION N/A 06/09/2016   Procedure: Left Heart Cath and Coronary Angiography;  Surgeon: Burnell Blanks, MD;  Location: [place] [facility];  Service: Cardiovascular;  Laterality: N/A;  . CARDIAC CATHETERIZATION N/A 06/09/2016   Procedure: Coronary Stent Intervention;  Surgeon: Burnell Blanks, MD;  Location: [place] [facility];  Service: Cardiovascular;  Laterality: N/A;  . KNEE ARTHROSCOPY Left   . LASER ABLATION Right    leg  . phlebectomy Right    leg  . SHOULDER SURGERY Right     Allergies  Allergen Reactions  . Amlodipine Swelling  . Metoprolol Tartrate Other (See Comments)    fatague    Current Outpatient Medications  Medication Sig Dispense Refill  . ALPRAZolam (XANAX) 1 MG tablet Take 1 in AM 1/2 at 4pm and 1 bedtime 75 tablet 5  . aspirin EC 81 MG tablet Take 243 mg by mouth at bedtime.    . Cholecalciferol (VITAMIN D3) 125 MCG (5000 UT) CAPS Take by mouth.    . liothyronine (CYTOMEL) 25 MCG tablet TAKE 3 TABLETS BY MOUTH EVERY DAY 270  tablet 0  . loratadine (CLARITIN) 10 MG tablet Take 10 mg by mouth daily as needed for allergies.    . nefazodone (SERZONE) 150 MG tablet Take 1 tablet (150 mg total) by mouth 2 (two) times daily. 60 tablet 14  . nitroGLYCERIN (NITROSTAT) 0.4 MG SL tablet PLACE ONE TABLET UNDER THE TONGUE AS NEEDED FOR CHEST PAIN 25 tablet 6  . ramipril (ALTACE) 10 MG capsule Take 1 capsule (10 mg total) by mouth 2 (two) times daily. 180 capsule 4  . rosuvastatin (CRESTOR) 5 MG tablet Take 1 tablet (5 mg total) by mouth daily. 90 tablet 4  . sertraline (ZOLOFT) 100 MG tablet TAKE 2 TABLETS (200 MG TOTAL) BY MOUTH 2 (TWO) TIMES DAILY. 360 tablet 1  . sildenafil (REVATIO) 20 MG tablet Take 1 tablet (20 mg total) by mouth daily as needed. 50 tablet 12  . sodium bicarbonate 650 MG tablet Take 1,300 mg  by mouth 2 (two) times daily.    Marland Kitchen testosterone cypionate (DEPOTESTOSTERONE CYPIONATE) 200 MG/ML injection INJECT 0.5ML INTO THE MUSCLE ONCE A WEEK 6 mL 5   No current facility-administered medications for this visit.     Family History  Problem Relation Age of Onset  . Cancer Mother   . Hyperlipidemia Mother   . Cancer Father   . Hyperlipidemia Father   . AAA (abdominal aortic aneurysm) Father     Social History   Socioeconomic History  . Marital status: Married    Spouse name: Not on file  . Number of children: Not on file  . Years of education: Not on file  . Highest education level: Not on file  Occupational History  . Not on file  Social Needs  . Financial resource strain: Not hard at all  . Food insecurity    Worry: Patient refused    Inability: Patient refused  . Transportation needs    Medical: Patient refused    Non-medical: Patient refused  Tobacco Use  . Smoking status: Never Smoker  . Smokeless tobacco: Never Used  Substance and Sexual Activity  . Alcohol use: No  . Drug use: No  . Sexual activity: Not on file  Lifestyle  . Physical activity    Days per week: 5 days    Minutes per session: 60 min  . Stress: Not at all  Relationships  . Social Herbalist on phone: Patient refused    Gets together: Patient refused    Attends religious service: Patient refused    Active member of club or organization: Patient refused    Attends meetings of clubs or organizations: Patient refused    Relationship status: Patient refused  . Intimate partner violence    Fear of current or ex partner: Patient refused    Emotionally abused: Patient refused    Physically abused: Patient refused    Forced sexual activity: Patient refused  Other Topics Concern  . Not on file  Social History Narrative  . Not on file     ROS: [x]  Positive   [ ]  Negative   [ ]  All sytems reviewed and are negative  Cardiac: [x]  high blood pressure   Vascular: []  denies leg  swelling  [x]  right arm swelling   Pulmonary: [x]  OSA   Neurologic: []  weakness in []  arms []  legs []  numbness in []  arms []  legs [] difficulty speaking or slurred speech []  temporary loss of vision in one eye []  dizziness  Hematologic: []  bleeding problems  GI []   GERD  GU: [x]  CKD/renal failure  [x]  HD []  burning with urination []  blood in urine  Psychiatric: [x]  anxiety   Integumentary: []  rashes []  ulcers  Constitutional: []  fever []  chills  PHYSICAL EXAMINATION:  Today's Vitals   08/29/19 1342  BP: 140/85  Pulse: 74  Resp: 14  Temp: 97.9 F (36.6 C)  TempSrc: Temporal  SpO2: 98%  Weight: 235 lb 9.6 oz (106.9 kg)  Height: 5\' 9"  (1.753 m)  PainSc: 4    Body mass index is 34.79 kg/m.    General:  WDWN male in NAD Gait: Not observed HENT: WNL Pulmonary: normal non-labored breathing , without Rales, rhonchi,  wheezing Cardiac: regular, without  Murmurs without carotid bruits Abdomen: soft, NT, no masses Skin: without rashes, without ulcers  Vascular Exam/Pulses:  +thrill and audible bruit in fistula but decreases proximally Extremities:  without ischemic changes, without Gangrene, without cellulitis; without open wounds; swelling right arm with mild pitting edema right hand.  He does not have any leg swelling bilaterally Musculoskeletal: no muscle wasting or atrophy  Neurologic: A&O X 3; Moving all extremities equally;  Speech is fluent/normal  Non-Invasive Vascular Imaging:   Dialysis duplex 08/29/2019: Findings: +--------------------+----------+-----------------+--------+ AVF                 PSV (cm/s)Flow Vol (mL/min)Comments +--------------------+----------+-----------------+--------+ Native artery inflow   170          1083                +--------------------+----------+-----------------+--------+ AVF Anastomosis        512                              +--------------------+----------+-----------------+--------+     +------------+----------+-------------+----------+-----------------------------+ OUTFLOW VEINPSV (cm/s)Diameter (cm)Depth (cm)          Describe            +------------+----------+-------------+----------+-----------------------------+ Dist UA         21        0.57        0.24          Retained valve         +------------+----------+-------------+----------+-----------------------------+ AC Fossa       136        0.67        0.30   Retained valve and competing                                                          branch             +------------+----------+-------------+----------+-----------------------------+ Prox Forearm   128        0.64        0.37         competing branch        +------------+----------+-------------+----------+-----------------------------+ Mid Forearm    160        0.70        0.42   competing branch and Retained  valve             +------------+----------+-------------+----------+-----------------------------+ Dist Forearm   101        0.79        0.33         competing branch        +------------+----------+-------------+----------+-----------------------------+   Summary: Patent arteriovenous fistula with several retained valves and multiple competing branches as listed above. Velocities less than 100cm/s noted in the distal upper arm.   ASSESSMENT/PLAN: 53 y.o. male with hx of right RC AVF and is here for evaluation of his hemodialysis access and right arm swelling  -pt with persistent arm swelling in right arm with right RC AVF.  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  He has velocities < 100cm/s in the distal upper arm.  He will need a fistulogram.    -His most recent K+ was 5.7 on Monday.  Pt opposed to getting HD catheter unless absolutely necessary.  Discussed with Dr. Oneida Alar and if K+ is around 5.7 on day of procedure, we can proceed.  Pt understands  that if his K+ is much higher than that, his procedure will be cancelled.  He understands that he may need a catheter in the future for HD as hyperkalemia is life threatening, which I discussed with him. 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   -will plan for fistulogram tomorrow afternoon, 08/30/2019 of the right arm fistula by Dr. Scot Dock.   -called Theadora Rama at HD center to discuss plan.    Leontine Locket, PA-C [facility] [phone]  Clinic MD:   Scot Dock

## 2019-08-30 ENCOUNTER — Other Ambulatory Visit: Payer: Self-pay | Admitting: *Deleted

## 2019-08-30 ENCOUNTER — Other Ambulatory Visit: Payer: Self-pay

## 2019-08-30 ENCOUNTER — Encounter (HOSPITAL_COMMUNITY)
Admission: RE | Disposition: A | Discharge status: Home / Self Care | Payer: Self-pay | Source: Home / Self Care | Attending: Vascular Surgery

## 2019-08-30 ENCOUNTER — Ambulatory Visit (HOSPITAL_COMMUNITY)
Admission: RE | Admit: 2019-08-30 | Discharge: 2019-08-30 | Disposition: A | Discharge status: Home / Self Care | Payer: PRIVATE HEALTH INSURANCE | Attending: Vascular Surgery | Admitting: Vascular Surgery

## 2019-08-30 DIAGNOSIS — N186 End stage renal disease: Secondary | ICD-10-CM | POA: Insufficient documentation

## 2019-08-30 DIAGNOSIS — T82858A Stenosis of vascular prosthetic devices, implants and grafts, initial encounter: Secondary | ICD-10-CM | POA: Diagnosis not present

## 2019-08-30 DIAGNOSIS — F329 Major depressive disorder, single episode, unspecified: Secondary | ICD-10-CM | POA: Diagnosis not present

## 2019-08-30 DIAGNOSIS — Y841 Kidney dialysis as the cause of abnormal reaction of the patient, or of later complication, without mention of misadventure at the time of the procedure: Secondary | ICD-10-CM | POA: Insufficient documentation

## 2019-08-30 DIAGNOSIS — Z7982 Long term (current) use of aspirin: Secondary | ICD-10-CM | POA: Diagnosis not present

## 2019-08-30 DIAGNOSIS — Z79899 Other long term (current) drug therapy: Secondary | ICD-10-CM | POA: Diagnosis not present

## 2019-08-30 DIAGNOSIS — F419 Anxiety disorder, unspecified: Secondary | ICD-10-CM | POA: Insufficient documentation

## 2019-08-30 DIAGNOSIS — I2510 Atherosclerotic heart disease of native coronary artery without angina pectoris: Secondary | ICD-10-CM | POA: Diagnosis not present

## 2019-08-30 DIAGNOSIS — G4730 Sleep apnea, unspecified: Secondary | ICD-10-CM | POA: Diagnosis not present

## 2019-08-30 DIAGNOSIS — I252 Old myocardial infarction: Secondary | ICD-10-CM | POA: Insufficient documentation

## 2019-08-30 DIAGNOSIS — I120 Hypertensive chronic kidney disease with stage 5 chronic kidney disease or end stage renal disease: Secondary | ICD-10-CM | POA: Diagnosis not present

## 2019-08-30 DIAGNOSIS — N185 Chronic kidney disease, stage 5: Secondary | ICD-10-CM | POA: Diagnosis not present

## 2019-08-30 DIAGNOSIS — Z888 Allergy status to other drugs, medicaments and biological substances status: Secondary | ICD-10-CM | POA: Insufficient documentation

## 2019-08-30 DIAGNOSIS — E785 Hyperlipidemia, unspecified: Secondary | ICD-10-CM | POA: Diagnosis not present

## 2019-08-30 DIAGNOSIS — E039 Hypothyroidism, unspecified: Secondary | ICD-10-CM | POA: Diagnosis not present

## 2019-08-30 DIAGNOSIS — Z992 Dependence on renal dialysis: Secondary | ICD-10-CM | POA: Diagnosis not present

## 2019-08-30 HISTORY — PX: PERIPHERAL VASCULAR BALLOON ANGIOPLASTY: CATH118281

## 2019-08-30 HISTORY — PX: A/V FISTULAGRAM: CATH118298

## 2019-08-30 LAB — POCT I-STAT, CHEM 8
BUN: >140 mg/dL — ABNORMAL HIGH (ref 6–20)
Calcium, Ion: 1.17 mmol/L (ref 1.15–1.40)
Chloride: 109 mmol/L (ref 98–111)
Creatinine, Ser: 12.2 mg/dL — ABNORMAL HIGH (ref 0.61–1.24)
Glucose, Bld: 95 mg/dL (ref 70–99)
HCT: 38 % — ABNORMAL LOW (ref 39.0–52.0)
Hemoglobin: 12.9 g/dL — ABNORMAL LOW (ref 13.0–17.0)
Potassium: 5.6 mmol/L — ABNORMAL HIGH (ref 3.5–5.1)
Sodium: 140 mmol/L (ref 135–145)
TCO2: 20 mmol/L — ABNORMAL LOW (ref 22–32)

## 2019-08-30 SURGERY — A/V FISTULAGRAM
Anesthesia: LOCAL | Laterality: Right

## 2019-08-30 MED ORDER — HEPARIN (PORCINE) IN NACL 2000-0.9 UNIT/L-% IV SOLN
INTRAVENOUS | Status: AC
  Filled 2019-08-30: qty 1000

## 2019-08-30 MED ORDER — HEPARIN SODIUM (PORCINE) 1000 UNIT/ML IJ SOLN
INTRAMUSCULAR | Status: DC | PRN
  Administered 2019-08-30: 7000 [IU] via INTRAVENOUS

## 2019-08-30 MED ORDER — HEPARIN SODIUM (PORCINE) 1000 UNIT/ML IJ SOLN
INTRAMUSCULAR | Status: AC
  Filled 2019-08-30: qty 1

## 2019-08-30 MED ORDER — IODIXANOL 320 MG/ML IV SOLN
INTRAVENOUS | Status: DC | PRN
  Administered 2019-08-30: 13:00:00 50 mL

## 2019-08-30 MED ORDER — LIDOCAINE HCL (PF) 1 % IJ SOLN
INTRAMUSCULAR | Status: AC
  Filled 2019-08-30: qty 30

## 2019-08-30 MED ORDER — HEPARIN (PORCINE) IN NACL 1000-0.9 UT/500ML-% IV SOLN
INTRAVENOUS | Status: AC
  Filled 2019-08-30: qty 500

## 2019-08-30 MED ORDER — SODIUM CHLORIDE 0.9% FLUSH: 3.0000 mL | INTRAVENOUS | Status: DC | PRN

## 2019-08-30 MED ORDER — LIDOCAINE HCL (PF) 1 % IJ SOLN
INTRAMUSCULAR | Status: DC | PRN
  Administered 2019-08-30: 5 mL via INTRADERMAL

## 2019-08-30 MED ORDER — HEPARIN (PORCINE) IN NACL 1000-0.9 UT/500ML-% IV SOLN
INTRAVENOUS | Status: DC | PRN
  Administered 2019-08-30: 500 mL

## 2019-08-30 SURGICAL SUPPLY — 14 items
BAG SNAP BAND KOVER 36X36 (MISCELLANEOUS) ×3 IMPLANT
BALLN ATLAS 12X40X75 (BALLOONS) ×3
BALLOON ATLAS 12X40X75 (BALLOONS) IMPLANT
COVER DOME SNAP 22 D (MISCELLANEOUS) ×3 IMPLANT
KIT ENCORE 26 ADVANTAGE (KITS) ×1 IMPLANT
KIT MICROPUNCTURE NIT STIFF (SHEATH) ×1 IMPLANT
PROTECTION STATION PRESSURIZED (MISCELLANEOUS) ×3
SHEATH PINNACLE R/O II 7F 4CM (SHEATH) ×1 IMPLANT
SHEATH PROBE COVER 6X72 (BAG) ×1 IMPLANT
STATION PROTECTION PRESSURIZED (MISCELLANEOUS) ×2 IMPLANT
STOPCOCK MORSE 400PSI 3WAY (MISCELLANEOUS) ×3 IMPLANT
TRAY PV CATH (CUSTOM PROCEDURE TRAY) ×3 IMPLANT
TUBING CIL FLEX 10 FLL-RA (TUBING) ×1 IMPLANT
WIRE BENTSON .035X145CM (WIRE) ×1 IMPLANT

## 2019-08-30 NOTE — Interval H&P Note (Signed)
History and Physical Interval Note:  08/30/2019 12:17 PM  Dylan Chandler  has presented today for surgery, with the diagnosis of Complication of fistula.  The various methods of treatment have been discussed with the patient and family. After consideration of risks, benefits and other options for treatment, the patient has consented to  Procedure(s): A/V FISTULAGRAM (Right) as a surgical intervention.  The patient's history has been reviewed, patient examined, no change in status, stable for surgery.  I have reviewed the patient's chart and labs.  Questions were answered to the patient's satisfaction.     [name]

## 2019-08-30 NOTE — Interval H&P Note (Signed)
History and Physical Interval Note:  08/30/2019 12:18 PM  Dylan Chandler  has presented today for surgery, with the diagnosis of Complication of fistula.  The various methods of treatment have been discussed with the patient and family. After consideration of risks, benefits and other options for treatment, the patient has consented to  Procedure(s): A/V FISTULAGRAM (Right) as a surgical intervention.  The patient's history has been reviewed, patient examined, no change in status, stable for surgery.  I have reviewed the patient's chart and labs.  Questions were answered to the patient's satisfaction.     [name]

## 2019-08-30 NOTE — Discharge Instructions (Signed)

## 2019-08-30 NOTE — Op Note (Signed)
   PATIENT: Dylan Chandler      MRN: [id number] DOB: 07/27/1966    DATE OF PROCEDURE: 08/30/2019  INDICATIONS:    Dylan Chandler is a 53 y.o. male with a poorly functioning right radiocephalic fistula.  He was set up for a fistulogram.  PROCEDURE:    1.  Ultrasound-guided access to right radiocephalic fistula 2.  Fistulogram right radiocephalic fistula 3.  Angioplasty of right subclavian vein with 12 mm x 4 cm Atlas balloon  SURGEON: Judeth Cornfield. Scot Dock, MD, FACS  ANESTHESIA: Local  EBL: Minimal  TECHNIQUE: The patient was taken to the peripheral vascular lab.  The right arm was prepped and draped in usual sterile fashion.  Under ultrasound guidance, after the skin was anesthetized, I cannulated the proximal fistula with a micropuncture needle and a micropuncture sheath was introduced over the wire.  A fistulogram was then obtained evaluating the fistula from the cannulation point to include the central veins.  We did inflate a blood pressure cuff on the forearm for a retrograde shot to demonstrate the proximal fistula.  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  I exchanged the micropuncture sheath for a 7 French sheath over a Bentson wire.  The patient was then heparinized with 7000 units of IV heparin.  The 12 x 40 mm Atlas balloon was positioned across the stenosis and inflated to 6 atm for 1 minute.  Completion film showed some recoil.  I therefore went back and inflated to 12 atm for 1 minute.  Again there was some persistent recoil but no significant waste.  I think the stenosis was improved at the completion.  There was good thrill in the fistula.  The cannulation site was closed with a 4-0 Monocryl suture.  There was good hemostasis.  FINDINGS:   1.  Patent right radiocephalic fistula. 2.  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  There was improvement in  the stenosis however at the completion. 3.  Multiple competing branches in the proximal fistula.  CLINICAL NOTE: The patient will be scheduled for ligation of multiple competing branches in the proximal fistula next week.  [name], MD, FACS [facility]  DATE OF DICTATION:   08/30/2019

## 2019-08-30 NOTE — Progress Notes (Signed)
Left message on voice mail for patient to call this office back ASAP for instructions for 09/05/2019 surgery with Dr. Trula Slade.

## 2019-09-02 ENCOUNTER — Encounter (HOSPITAL_COMMUNITY): Payer: Self-pay | Admitting: Vascular Surgery

## 2019-09-03 ENCOUNTER — Encounter (HOSPITAL_COMMUNITY): Payer: Self-pay | Admitting: Vascular Surgery

## 2019-09-11 ENCOUNTER — Other Ambulatory Visit: Payer: Self-pay | Admitting: *Deleted

## 2019-09-25 ENCOUNTER — Other Ambulatory Visit: Payer: Self-pay

## 2019-09-25 ENCOUNTER — Other Ambulatory Visit
Admission: RE | Admit: 2019-09-25 | Discharge: 2019-09-25 | Disposition: A | Discharge status: Home / Self Care | Payer: PRIVATE HEALTH INSURANCE | Source: Ambulatory Visit | Attending: Surgery | Admitting: Surgery

## 2019-09-25 DIAGNOSIS — Z01812 Encounter for preprocedural laboratory examination: Secondary | ICD-10-CM | POA: Diagnosis not present

## 2019-09-25 DIAGNOSIS — Z20828 Contact with and (suspected) exposure to other viral communicable diseases: Secondary | ICD-10-CM | POA: Diagnosis not present

## 2019-09-25 DIAGNOSIS — Z20822 Contact with and (suspected) exposure to covid-19: Secondary | ICD-10-CM

## 2019-09-26 ENCOUNTER — Other Ambulatory Visit: Payer: Self-pay

## 2019-09-26 ENCOUNTER — Encounter (HOSPITAL_COMMUNITY): Payer: Self-pay | Admitting: *Deleted

## 2019-09-26 LAB — SARS CORONAVIRUS 2 (TAT 6-24 HRS): SARS Coronavirus 2: NEGATIVE

## 2019-09-26 NOTE — Progress Notes (Signed)
Dylan Chandler denies chest pain or shortness of breath.  Patient reported that he stopped taking Aspirin a week ago, "no one told me to, I just know it is usually stopped prior to  Surgery."  Dylan Chandler tested negative negative for Covid, and has been in qurantine, except to go to dialysis.

## 2019-09-26 NOTE — Progress Notes (Addendum)
Anesthesia Chart Review:  Same day workup   Case: [id number] Date/Time: 09/27/19 0715   Procedure: LIGATION OF MULTIPLE COMPETING BRANCHES RIGHT RADIOCEPHALIC ARTERIOVENOUS FISTULA (Right )   Anesthesia type: Choice   Pre-op diagnosis: COMPLICATION OF ARTERIOVENOUS FISTULA RIGHT ARM   Location: [facility] OR ROOM 11 / [place] OR   Surgeon: Serafina Mitchell, MD      DISCUSSION:  ESRD on HD. Hx of CAD s/p stent to RCA in 2007 and STEMI 06/09/2016 treated with DES to LAD and advised DAPT for 1 year (per pt he did complete 1 year of DAPT). It does not appear that he ever followed up with cardiology after discharge. He is seen regularly by PCP and Nephrology but it does not appear he has had repeat Echo since his STEMI. At that time Echo showed EF of 30-35% with severe MR. 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 Denies CV symptoms.   Discussed case with Dr. Tobias Alexander, okay to proceed as planned. He will need DOS labs and eval.  VS: There were no vitals taken for this visit.  PROVIDERS: Guadalupe Maple, MD is PCP   LABS: Will need DOS labs  Labs Reviewed - No data to display   IMAGES: CHEST - 2 VIEW 06/12/19  COMPARISON:  None.  FINDINGS: Cardiomegaly. Both lungs are clear. Disc degenerative disease and ankylosis of the thoracic spine.  IMPRESSION: Cardiomegaly without acute abnormality of the lungs.   CV: Cardiac Cath 06/09/2016 Conclusion    Prox RCA lesion, 20 %stenosed.  Mid RCA lesion, 20 %stenosed.  Mid RCA to Dist RCA lesion, 20 %stenosed.  Dist RCA lesion, 30 %stenosed.  Prox Cx lesion, 20 %stenosed.  3rd Mrg lesion, 20 %stenosed.  2nd Mrg lesion, 20 %stenosed.  Ost 1st Diag to 1st Diag lesion, 20 %stenosed.  Ost 2nd Diag to 2nd Diag lesion, 20 %stenosed.  Dist LAD lesion, 20 %stenosed.  A STENT PROMUS PREM MR 3.5X24 drug  eluting stent was successfully placed.  Prox LAD to Mid LAD lesion, 99 %stenosed.  Post intervention, there is a 0% residual stenosis.  1. Acute anterior STEMI secondary to sub-totally occluded mid LAD 2. Successful PTCA/DES x 1 mid LAD 3. Patent stent in the distal RCA 4. Non-obstructive disease noted in the RCA and Circumflex    Echocardiogram 06/10/2016 Left ventricle: The cavity size was moderately dilated. There was mild concentric hypertrophy. Systolic function was moderately to severely reduced. The estimated ejection fraction was in the range of 30% to 35%. Dyskinesis of the apical myocardium. Hypokinesis of the mid-apicalanteroseptal and anterior myocardium. Features are consistent with a pseudonormal left ventricular filling pattern, with concomitant abnormal relaxation and increased filling pressure (grade 2 diastolic dysfunction). No evidence of thrombus. - Mitral valve: Calcified annulus. There was severe regurgitation directed centrally, eccentrically, and toward the septum. The acceleration rate of the regurgitant jet was reduced, consistent with a low dP/dt. Severe regurgitation is suggested by pulmonary vein systolic flow reversal. - Left atrium: The atrium was moderately dilated.  Past Medical History:  Diagnosis Date  . Anxiety   . CAD (coronary artery disease)   . Cardiac murmur   . Chronic kidney disease   . Depression   . Elevated SGOT   . Hyperlipidemia   . Hypertension   . 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 Hypothyroidism   . Myocardial infarction ([place])  2008, 2017  . Sleep apnea   . Thyroid disease     Past Surgical History:  Procedure Laterality Date  . A/V FISTULAGRAM Right 08/30/2019   Procedure: A/V FISTULAGRAM;  Surgeon: Angelia Mould, MD;  Location: [place] [facility];  Service: Cardiovascular;  Laterality: Right;  . AV FISTULA PLACEMENT Right 07/31/2018   Procedure: Right Radiocephalic  ARTERIOVENOUS (AV) FISTULA CREATION;  Surgeon: Angelia Mould, MD;  Location: [place];  Service: Vascular;  Laterality: Right;  . CARDIAC CATHETERIZATION N/A 06/09/2016   Procedure: Left Heart Cath and Coronary Angiography;  Surgeon: Burnell Blanks, MD;  Location: [place] [facility];  Service: Cardiovascular;  Laterality: N/A;  . CARDIAC CATHETERIZATION N/A 06/09/2016   Procedure: Coronary Stent Intervention;  Surgeon: Burnell Blanks, MD;  Location: [place] [facility];  Service: Cardiovascular;  Laterality: N/A;  . KNEE ARTHROSCOPY Left   . LASER ABLATION Right    leg  . PERIPHERAL VASCULAR BALLOON ANGIOPLASTY  08/30/2019   Procedure: PERIPHERAL VASCULAR BALLOON ANGIOPLASTY;  Surgeon: Angelia Mould, MD;  Location: [place] [facility];  Service: Cardiovascular;;  . phlebectomy Right    leg  . SHOULDER SURGERY Right     MEDICATIONS: No current facility-administered medications for this encounter.    Marland Kitchen ALPRAZolam (XANAX) 1 MG tablet  . aspirin EC 81 MG tablet  . calcitRIOL (ROCALTROL) 0.25 MCG capsule  . ibuprofen (ADVIL) 200 MG tablet  . lidocaine-prilocaine (EMLA) cream  . liothyronine (CYTOMEL) 25 MCG tablet  . nefazodone (SERZONE) 150 MG tablet  . nitroGLYCERIN (NITROSTAT) 0.4 MG SL tablet  . ramipril (ALTACE) 10 MG capsule  . rosuvastatin (CRESTOR) 5 MG tablet  . sertraline (ZOLOFT) 100 MG tablet  . sevelamer carbonate (RENVELA) 800 MG tablet  . sildenafil (VIAGRA) 100 MG tablet  . sodium bicarbonate 650 MG tablet  . testosterone cypionate (DEPOTESTOSTERONE CYPIONATE) 200 MG/ML injection  . torsemide (DEMADEX) 20 MG tablet  . Zinc 50 MG CAPS  . sildenafil (REVATIO) 20 MG tablet   Wynonia Musty [facility] Short Stay Center/Anesthesiology Phone [phone] 09/26/2019 3:41 PM

## 2019-09-26 NOTE — Anesthesia Preprocedure Evaluation (Addendum)
Anesthesia Evaluation  Patient identified by MRN, date of birth, ID band Patient awake    Reviewed: Allergy & Precautions, NPO status , Patient's Chart, lab work & pertinent test results  History of Anesthesia Complications Negative for: history of anesthetic complications  Airway Mallampati: II  TM Distance: >3 FB Neck ROM: Full    Dental  (+) Dental Advisory Given, Teeth Intact   Pulmonary sleep apnea ,    breath sounds clear to auscultation       Cardiovascular hypertension, Pt. on medications + CAD and + Past MI   Rhythm:Regular     Neuro/Psych PSYCHIATRIC DISORDERS Anxiety Depression negative neurological ROS     GI/Hepatic negative GI ROS, Neg liver ROS,   Endo/Other  Hypothyroidism   Renal/GU ESRF and DialysisRenal disease     Musculoskeletal negative musculoskeletal ROS (+)   Abdominal   Peds  Hematology  (+) Blood dyscrasia, anemia ,   Anesthesia Other Findings Hypogonadotropic hypogonadism 1   Reproductive/Obstetrics                                                            Anesthesia Evaluation  Patient identified by MRN, date of birth, ID band  Reviewed: Allergy & Precautions  Airway Mallampati: II  TM Distance: >3 FB Neck ROM: Full    Dental  (+) Teeth Intact, Dental Advisory Given   Pulmonary sleep apnea ,    breath sounds clear to auscultation       Cardiovascular hypertension, Pt. on medications + CAD and + Past MI  + Valvular Problems/Murmurs  Rhythm:Regular Rate:Normal     Neuro/Psych    GI/Hepatic negative GI ROS, Neg liver ROS,   Endo/Other  Hypothyroidism   Renal/GU Renal disease     Musculoskeletal   Abdominal   Peds  Hematology   Anesthesia Other Findings   Reproductive/Obstetrics                            Anesthesia Physical Anesthesia Plan  ASA: III  Anesthesia Plan: MAC   Post-op Pain  Management:    Induction: Intravenous  PONV Risk Score and Plan: Ondansetron, Dexamethasone and Midazolam  Airway Management Planned: Simple Face Mask and Nasal Cannula  Additional Equipment:   Intra-op Plan:   Post-operative Plan:   Informed Consent: I have reviewed the patients History and Physical, chart, labs and discussed the procedure including the risks, benefits and alternatives for the proposed anesthesia with the patient or authorized representative who has indicated his/her understanding and acceptance.   Dental advisory given  Plan Discussed with: CRNA and Anesthesiologist  Anesthesia Plan Comments:         Anesthesia Quick Evaluation  Anesthesia Physical Anesthesia Plan  ASA: III  Anesthesia Plan: MAC   Post-op Pain Management:    Induction: Intravenous  PONV Risk Score and Plan: 1 and Treatment may vary due to age or medical condition and Propofol infusion  Airway Management Planned: Nasal Cannula  Additional Equipment: None  Intra-op Plan:   Post-operative Plan:   Informed Consent: I have reviewed the patients History and Physical, chart, labs and discussed the procedure including the risks, benefits and alternatives for the proposed anesthesia with the patient or authorized representative who has indicated his/her understanding and acceptance.  Dental advisory given  Plan Discussed with: CRNA and Surgeon  Anesthesia Plan Comments: (ESRD on HD. Hx of CAD s/p stent to RCA in 2007 and STEMI 06/09/2016 treated with DES to LAD and advised DAPT for 1 year (per pt he did complete 1 year of DAPT). It does not appear that he ever followed up with cardiology after discharge. He is seen regularly by PCP and Nephrology but it does not appear he has had repeat Echo since his STEMI. At that time Echo showed EF of 30-35% with severe MR. 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 Denies CV symptoms.   Discussed case with Dr. Tobias Alexander, okay to proceed as planned. He will need DOS labs and eval.  Cardiac Cath 06/09/2016 Prox RCA lesion, 20 %stenosed. Mid RCA lesion, 20 %stenosed. Mid RCA to Dist RCA lesion, 20 %stenosed. Dist RCA lesion, 30 %stenosed. Prox Cx lesion, 20 %stenosed. 3rd Mrg lesion, 20 %stenosed. 2nd Mrg lesion, 20 %stenosed. Ost 1st Diag to 1st Diag lesion, 20 %stenosed. Ost 2nd Diag to 2nd Diag lesion, 20 %stenosed. Dist LAD lesion, 20 %stenosed. A STENT PROMUS PREM MR 3.5X24 drug eluting stent was successfully placed. Prox LAD to Mid LAD lesion, 99 %stenosed. Post intervention, there is a 0% residual stenosis.  1. Acute anterior STEMI secondary to sub-totally occluded mid LAD 2. Successful PTCA/DES x 1 mid LAD 3. Patent stent in the distal RCA 4. Non-obstructive disease noted in the RCA and Circumflex   Echocardiogram 06/10/2016 Left ventricle: The cavity size was moderately dilated. There was mild concentric hypertrophy. Systolic function was moderately to severely reduced. The estimated ejection fraction was in the range of 30% to 35%. Dyskinesis of the apical myocardium. Hypokinesis of the mid-apicalanteroseptal and anterior myocardium. Features are consistent with a pseudonormal left ventricular filling pattern, with concomitant abnormal relaxation and increased filling pressure (grade 2 diastolic dysfunction). No evidence of thrombus. - Mitral valve: Calcified annulus. There was severe regurgitation directed centrally, eccentrically, and toward the septum. The acceleration rate of the regurgitant jet was reduced, consistent with a low dP/dt. Severe regurgitation is suggested by pulmonary vein systolic flow reversal. - Left atrium: The atrium was moderately dilated.)      Anesthesia Quick Evaluation

## 2019-09-27 ENCOUNTER — Ambulatory Visit (HOSPITAL_COMMUNITY): Payer: PRIVATE HEALTH INSURANCE | Admitting: Certified Registered"

## 2019-09-27 ENCOUNTER — Other Ambulatory Visit: Payer: Self-pay

## 2019-09-27 ENCOUNTER — Ambulatory Visit (HOSPITAL_COMMUNITY)
Admission: RE | Admit: 2019-09-27 | Discharge: 2019-09-27 | Disposition: A | Discharge status: Home / Self Care | Payer: PRIVATE HEALTH INSURANCE | Attending: Surgery | Admitting: Surgery

## 2019-09-27 ENCOUNTER — Encounter (HOSPITAL_COMMUNITY): Payer: Self-pay

## 2019-09-27 ENCOUNTER — Encounter (HOSPITAL_COMMUNITY)
Admission: RE | Disposition: A | Discharge status: Home / Self Care | Payer: Self-pay | Source: Home / Self Care | Attending: Surgery

## 2019-09-27 DIAGNOSIS — I871 Compression of vein: Secondary | ICD-10-CM | POA: Diagnosis not present

## 2019-09-27 DIAGNOSIS — T82898A Other specified complication of vascular prosthetic devices, implants and grafts, initial encounter: Secondary | ICD-10-CM

## 2019-09-27 DIAGNOSIS — R011 Cardiac murmur, unspecified: Secondary | ICD-10-CM | POA: Insufficient documentation

## 2019-09-27 DIAGNOSIS — Z7982 Long term (current) use of aspirin: Secondary | ICD-10-CM | POA: Insufficient documentation

## 2019-09-27 DIAGNOSIS — Z888 Allergy status to other drugs, medicaments and biological substances status: Secondary | ICD-10-CM | POA: Insufficient documentation

## 2019-09-27 DIAGNOSIS — Z791 Long term (current) use of non-steroidal anti-inflammatories (NSAID): Secondary | ICD-10-CM | POA: Insufficient documentation

## 2019-09-27 DIAGNOSIS — Z79899 Other long term (current) drug therapy: Secondary | ICD-10-CM | POA: Insufficient documentation

## 2019-09-27 DIAGNOSIS — Z992 Dependence on renal dialysis: Secondary | ICD-10-CM | POA: Insufficient documentation

## 2019-09-27 DIAGNOSIS — Z955 Presence of coronary angioplasty implant and graft: Secondary | ICD-10-CM | POA: Insufficient documentation

## 2019-09-27 DIAGNOSIS — I252 Old myocardial infarction: Secondary | ICD-10-CM | POA: Diagnosis not present

## 2019-09-27 DIAGNOSIS — E039 Hypothyroidism, unspecified: Secondary | ICD-10-CM | POA: Diagnosis not present

## 2019-09-27 DIAGNOSIS — Z8249 Family history of ischemic heart disease and other diseases of the circulatory system: Secondary | ICD-10-CM | POA: Insufficient documentation

## 2019-09-27 DIAGNOSIS — I2510 Atherosclerotic heart disease of native coronary artery without angina pectoris: Secondary | ICD-10-CM | POA: Diagnosis not present

## 2019-09-27 DIAGNOSIS — E785 Hyperlipidemia, unspecified: Secondary | ICD-10-CM | POA: Diagnosis not present

## 2019-09-27 DIAGNOSIS — N186 End stage renal disease: Secondary | ICD-10-CM | POA: Insufficient documentation

## 2019-09-27 DIAGNOSIS — G4730 Sleep apnea, unspecified: Secondary | ICD-10-CM | POA: Insufficient documentation

## 2019-09-27 DIAGNOSIS — Z809 Family history of malignant neoplasm, unspecified: Secondary | ICD-10-CM | POA: Insufficient documentation

## 2019-09-27 DIAGNOSIS — F329 Major depressive disorder, single episode, unspecified: Secondary | ICD-10-CM | POA: Insufficient documentation

## 2019-09-27 DIAGNOSIS — I120 Hypertensive chronic kidney disease with stage 5 chronic kidney disease or end stage renal disease: Secondary | ICD-10-CM | POA: Diagnosis not present

## 2019-09-27 DIAGNOSIS — F419 Anxiety disorder, unspecified: Secondary | ICD-10-CM | POA: Diagnosis not present

## 2019-09-27 HISTORY — DX: Chronic kidney disease, unspecified: N18.9

## 2019-09-27 HISTORY — PX: LIGATION OF ARTERIOVENOUS  FISTULA: SHX5948

## 2019-09-27 LAB — POCT I-STAT, CHEM 8
BUN: 120 mg/dL — ABNORMAL HIGH (ref 6–20)
Calcium, Ion: 1.19 mmol/L (ref 1.15–1.40)
Chloride: 103 mmol/L (ref 98–111)
Creatinine, Ser: 9.7 mg/dL — ABNORMAL HIGH (ref 0.61–1.24)
Glucose, Bld: 98 mg/dL (ref 70–99)
HCT: 33 % — ABNORMAL LOW (ref 39.0–52.0)
Hemoglobin: 11.2 g/dL — ABNORMAL LOW (ref 13.0–17.0)
Potassium: 4.7 mmol/L (ref 3.5–5.1)
Sodium: 140 mmol/L (ref 135–145)
TCO2: 22 mmol/L (ref 22–32)

## 2019-09-27 SURGERY — LIGATION OF ARTERIOVENOUS  FISTULA
Anesthesia: Monitor Anesthesia Care | Laterality: Right

## 2019-09-27 MED ORDER — FENTANYL CITRATE (PF) 100 MCG/2ML IJ SOLN
INTRAMUSCULAR | Status: DC | PRN
  Administered 2019-09-27 (×2): 50 ug via INTRAVENOUS

## 2019-09-27 MED ORDER — FENTANYL CITRATE (PF) 100 MCG/2ML IJ SOLN
INTRAMUSCULAR | Status: AC
  Filled 2019-09-27: qty 2

## 2019-09-27 MED ORDER — ACETAMINOPHEN 160 MG/5ML PO SOLN: 1000.0000 mg | Freq: Once | ORAL | Status: DC | PRN

## 2019-09-27 MED ORDER — 0.9 % SODIUM CHLORIDE (POUR BTL) OPTIME
TOPICAL | Status: DC | PRN
  Administered 2019-09-27: 1000 mL

## 2019-09-27 MED ORDER — FENTANYL CITRATE (PF) 250 MCG/5ML IJ SOLN
INTRAMUSCULAR | Status: AC
  Filled 2019-09-27: qty 5

## 2019-09-27 MED ORDER — CHLORHEXIDINE GLUCONATE 4 % EX LIQD: 60.0000 mL | Freq: Once | CUTANEOUS | Status: DC

## 2019-09-27 MED ORDER — LIDOCAINE 2% (20 MG/ML) 5 ML SYRINGE
INTRAMUSCULAR | Status: DC | PRN
  Administered 2019-09-27: 20 mg via INTRAVENOUS

## 2019-09-27 MED ORDER — OXYCODONE HCL 5 MG/5ML PO SOLN: 5.0000 mg | Freq: Once | ORAL | Status: AC | PRN

## 2019-09-27 MED ORDER — ACETAMINOPHEN 10 MG/ML IV SOLN: 1000.0000 mg | Freq: Once | INTRAVENOUS | Status: DC | PRN

## 2019-09-27 MED ORDER — ONDANSETRON HCL 4 MG/2ML IJ SOLN
INTRAMUSCULAR | Status: DC | PRN
  Administered 2019-09-27: 4 mg via INTRAVENOUS

## 2019-09-27 MED ORDER — FENTANYL CITRATE (PF) 100 MCG/2ML IJ SOLN
25.0000 ug | INTRAMUSCULAR | Status: DC | PRN
  Administered 2019-09-27: 25 ug via INTRAVENOUS

## 2019-09-27 MED ORDER — MIDAZOLAM HCL 5 MG/5ML IJ SOLN
INTRAMUSCULAR | Status: DC | PRN
  Administered 2019-09-27: 2 mg via INTRAVENOUS

## 2019-09-27 MED ORDER — OXYCODONE HCL 5 MG PO TABS
5.0000 mg | ORAL_TABLET | Freq: Once | ORAL | Status: AC | PRN
  Administered 2019-09-27: 5 mg via ORAL

## 2019-09-27 MED ORDER — CEFAZOLIN SODIUM-DEXTROSE 2-4 GM/100ML-% IV SOLN
2.0000 g | INTRAVENOUS | Status: AC
  Administered 2019-09-27: 2 g via INTRAVENOUS
  Filled 2019-09-27: qty 100

## 2019-09-27 MED ORDER — OXYCODONE HCL 5 MG PO TABS
ORAL_TABLET | ORAL | Status: AC
  Filled 2019-09-27: qty 1

## 2019-09-27 MED ORDER — LIDOCAINE-EPINEPHRINE 1 %-1:100000 IJ SOLN
INTRAMUSCULAR | Status: AC
  Filled 2019-09-27: qty 1

## 2019-09-27 MED ORDER — SODIUM CHLORIDE 0.9 % IV SOLN
INTRAVENOUS | Status: AC
  Filled 2019-09-27: qty 1.2

## 2019-09-27 MED ORDER — PROPOFOL 10 MG/ML IV BOLUS
INTRAVENOUS | Status: AC
  Filled 2019-09-27: qty 20

## 2019-09-27 MED ORDER — SODIUM CHLORIDE 0.9 % IV SOLN
INTRAVENOUS | Status: DC | PRN
  Administered 2019-09-27: 500 mL

## 2019-09-27 MED ORDER — OXYCODONE-ACETAMINOPHEN 5-325 MG PO TABS: 1.0000 | ORAL_TABLET | Freq: Four times a day (QID) | ORAL | 0 refills | Status: DC | PRN

## 2019-09-27 MED ORDER — SODIUM CHLORIDE 0.9 % IV SOLN
INTRAVENOUS | Status: DC
  Administered 2019-09-27: 07:00:00 via INTRAVENOUS

## 2019-09-27 MED ORDER — ACETAMINOPHEN 500 MG PO TABS: 1000.0000 mg | ORAL_TABLET | Freq: Once | ORAL | Status: DC | PRN

## 2019-09-27 MED ORDER — MIDAZOLAM HCL 2 MG/2ML IJ SOLN
INTRAMUSCULAR | Status: AC
  Filled 2019-09-27: qty 2

## 2019-09-27 MED ORDER — LIDOCAINE HCL (PF) 1 % IJ SOLN
INTRAMUSCULAR | Status: AC
  Filled 2019-09-27: qty 30

## 2019-09-27 MED ORDER — LIDOCAINE-EPINEPHRINE 1 %-1:100000 IJ SOLN
INTRAMUSCULAR | Status: DC | PRN
  Administered 2019-09-27: 8 mL

## 2019-09-27 MED ORDER — PROPOFOL 500 MG/50ML IV EMUL
INTRAVENOUS | Status: DC | PRN
  Administered 2019-09-27: 40 ug/kg/min via INTRAVENOUS

## 2019-09-27 SURGICAL SUPPLY — 37 items
ADH SKN CLS APL DERMABOND .7 (GAUZE/BANDAGES/DRESSINGS) ×1
BNDG ELASTIC 4X5.8 VLCR STR LF (GAUZE/BANDAGES/DRESSINGS) ×1 IMPLANT
CANISTER SUCT 3000ML PPV (MISCELLANEOUS) ×2 IMPLANT
CLIP VESOCCLUDE MED 6/CT (CLIP) ×2 IMPLANT
CLIP VESOCCLUDE SM WIDE 6/CT (CLIP) ×2 IMPLANT
COVER WAND RF STERILE (DRAPES) ×1 IMPLANT
DERMABOND ADVANCED (GAUZE/BANDAGES/DRESSINGS) ×1
DERMABOND ADVANCED .7 DNX12 (GAUZE/BANDAGES/DRESSINGS) ×1 IMPLANT
ELECT REM PT RETURN 9FT ADLT (ELECTROSURGICAL) ×2
ELECTRODE REM PT RTRN 9FT ADLT (ELECTROSURGICAL) ×1 IMPLANT
GLOVE BIOGEL PI IND STRL 6.5 (GLOVE) IMPLANT
GLOVE BIOGEL PI IND STRL 7.5 (GLOVE) ×1 IMPLANT
GLOVE BIOGEL PI INDICATOR 6.5 (GLOVE) ×2
GLOVE BIOGEL PI INDICATOR 7.5 (GLOVE) ×2
GLOVE SURG SS PI 6.5 STRL IVOR (GLOVE) ×2 IMPLANT
GLOVE SURG SS PI 7.5 STRL IVOR (GLOVE) ×2 IMPLANT
GOWN STRL REUS W/ TWL LRG LVL3 (GOWN DISPOSABLE) ×2 IMPLANT
GOWN STRL REUS W/ TWL XL LVL3 (GOWN DISPOSABLE) ×1 IMPLANT
GOWN STRL REUS W/TWL LRG LVL3 (GOWN DISPOSABLE) ×4
GOWN STRL REUS W/TWL XL LVL3 (GOWN DISPOSABLE) ×2
HEMOSTAT SNOW SURGICEL 2X4 (HEMOSTASIS) IMPLANT
KIT BASIN OR (CUSTOM PROCEDURE TRAY) ×2 IMPLANT
KIT TURNOVER KIT B (KITS) ×2 IMPLANT
NS IRRIG 1000ML POUR BTL (IV SOLUTION) ×2 IMPLANT
PACK CV ACCESS (CUSTOM PROCEDURE TRAY) ×2 IMPLANT
PAD ARMBOARD 7.5X6 YLW CONV (MISCELLANEOUS) ×4 IMPLANT
SUT ETHILON 3 0 PS 1 (SUTURE) IMPLANT
SUT PROLENE 5 0 C 1 24 (SUTURE) ×1 IMPLANT
SUT PROLENE 6 0 BV (SUTURE) ×3 IMPLANT
SUT PROLENE 7 0 BV 1 (SUTURE) ×1 IMPLANT
SUT SILK 0 TIES 10X30 (SUTURE) ×2 IMPLANT
SUT VIC AB 3-0 SH 27 (SUTURE) ×2
SUT VIC AB 3-0 SH 27X BRD (SUTURE) ×1 IMPLANT
SUT VICRYL 4-0 PS2 18IN ABS (SUTURE) ×1 IMPLANT
TOWEL GREEN STERILE (TOWEL DISPOSABLE) ×2 IMPLANT
UNDERPAD 30X30 (UNDERPADS AND DIAPERS) ×2 IMPLANT
WATER STERILE IRR 1000ML POUR (IV SOLUTION) ×2 IMPLANT

## 2019-09-27 NOTE — Transfer of Care (Signed)
Immediate Anesthesia Transfer of Care Note  Patient: Dylan Chandler  Procedure(s) Performed: LIGATION OF MULTIPLE COMPETING BRANCHES RIGHT RADIOCEPHALIC ARTERIOVENOUS FISTULA (Right )  Patient Location: PACU  Anesthesia Type:MAC  Level of Consciousness: awake, alert  and oriented  Airway & Oxygen Therapy: Patient connected to nasal cannula oxygen  Post-op Assessment: Post -op Vital signs reviewed and stable  Post vital signs: stable  Last Vitals:  Vitals Value Taken Time  BP    Temp    Pulse    Resp    SpO2      Last Pain:  Vitals:   09/27/19 0635  TempSrc:   PainSc: 0-No pain         Complications: No apparent anesthesia complications

## 2019-09-27 NOTE — Discharge Instructions (Signed)
° °  [facility] [place] ° °Discharge Instructions ° °AV Fistula or Graft Surgery for Dialysis Access ° °Please refer to the following instructions for your post-procedure care. Your surgeon or physician assistant will discuss any changes with you. ° °Activity ° °You may drive the day following your surgery, if you are comfortable and no longer taking prescription pain medication. Resume full activity as the soreness in your incision resolves. ° °Bathing/Showering ° °You may shower after you go home. Keep your incision dry for 48 hours. Do not soak in a bathtub, hot tub, or swim until the incision heals completely. You may not shower if you have a hemodialysis catheter. ° °Incision Care ° °Clean your incision with mild soap and water after 48 hours. Pat the area dry with a clean towel. You do not need a bandage unless otherwise instructed. Do not apply any ointments or creams to your incision. You may have skin glue on your incision. Do not peel it off. It will come off on its own in about one week. Your arm may swell a bit after surgery. To reduce swelling use pillows to elevate your arm so it is above your heart. Your doctor will tell you if you need to lightly wrap your arm with an ACE bandage. ° °Diet ° °Resume your normal diet. There are not special food restrictions following this procedure. In order to heal from your surgery, it is CRITICAL to get adequate nutrition. Your body requires vitamins, minerals, and protein. Vegetables are the best source of vitamins and minerals. Vegetables also provide the perfect balance of protein. Processed food has little nutritional value, so try to avoid this. ° °Medications ° °Resume taking all of your medications. If your incision is causing pain, you may take over-the counter pain relievers such as acetaminophen (Tylenol). If you were prescribed a stronger pain medication, please be aware these medications can cause nausea and constipation. Prevent  nausea by taking the medication with a snack or meal. Avoid constipation by drinking plenty of fluids and eating foods with high amount of fiber, such as fruits, vegetables, and grains. Do not take Tylenol if you are taking prescription pain medications. ° ° ° ° °Follow up °Your surgeon may want to see you in the office following your access surgery. If so, this will be arranged at the time of your surgery. ° °Please call us immediately for any of the following conditions: ° °Increased pain, redness, drainage (pus) from your incision site °Fever of 101 degrees or higher °Severe or worsening pain at your incision site °Hand pain or numbness. ° °Reduce your risk of vascular disease: ° °Stop smoking. If you would like help, call [facility] at 1-800-QUIT-NOW (1-800-784-8669) or [place] at 336-586-4000 ° °Manage your cholesterol °Maintain a desired weight °Control your diabetes °Keep your blood pressure down ° °Dialysis ° °It will take several weeks to several months for your new dialysis access to be ready for use. Your surgeon will determine when it is OK to use it. Your nephrologist will continue to direct your dialysis. You can continue to use your Permcath until your new access is ready for use. ° °If you have any questions, please call the office at [phone]. ° °

## 2019-09-27 NOTE — H&P (Signed)
   Patient name: Dylan Chandler MRN: [id number] DOB: [birth year] Sex: male    HISTORY OF PRESENT ILLNESS:   Dylan Chandler is a 53 y.o. male with ESRD.  He is herer today for branch ligation  CURRENT MEDICATIONS:    Current Facility-Administered Medications  Medication Dose Route Frequency Provider Last Rate Last Dose  . 0.9 %  sodium chloride infusion   Intravenous Continuous Serafina Mitchell, MD      . ceFAZolin (ANCEF) IVPB 2g/100 mL premix  2 g Intravenous 30 min Pre-Op Serafina Mitchell, MD      . chlorhexidine (HIBICLENS) 4 % liquid 4 application  60 mL Topical Once Serafina Mitchell, MD       And  . [name] ON 09/28/2019] chlorhexidine (HIBICLENS) 4 % liquid 4 application  60 mL Topical Once Serafina Mitchell, MD        REVIEW OF SYSTEMS:   [X]  denotes positive finding, [ ]  denotes negative finding Cardiac  Comments:  Chest pain or chest pressure:    Shortness of breath upon exertion:    Short of breath when lying flat:    Irregular heart rhythm:    Constitutional    Fever or chills:      PHYSICAL EXAM:   Vitals:   09/27/19 0626  BP: (!) 146/89  Pulse: 87  Resp: 18  Temp: 98.3 F (36.8 C)  TempSrc: Oral  SpO2: 96%  Weight: 106.6 kg  Height: 5\' 9"  (1.753 m)    GENERAL: The patient is a well-nourished male, in no acute distress. The vital signs are documented above. CARDIOVASCULAR: There is a regular rate and rhythm. PULMONARY: Non-labored respirations   STUDIES:      MEDICAL ISSUES:   Branch ligation of right arm fistula  Dylan Alf, MD, FACS [facility] [phone] Pager [phone]

## 2019-09-27 NOTE — Anesthesia Postprocedure Evaluation (Signed)
Anesthesia Post Note  Patient: Dylan Chandler  Procedure(s) Performed: LIGATION OF MULTIPLE COMPETING BRANCHES RIGHT RADIOCEPHALIC ARTERIOVENOUS FISTULA (Right )     Patient location during evaluation: PACU Anesthesia Type: MAC Level of consciousness: awake and alert Pain management: pain level controlled Vital Signs Assessment: post-procedure vital signs reviewed and stable Respiratory status: spontaneous breathing, nonlabored ventilation, respiratory function stable and patient connected to nasal cannula oxygen Cardiovascular status: stable and blood pressure returned to baseline Postop Assessment: no apparent nausea or vomiting Anesthetic complications: no    Last Vitals:  Vitals:   09/27/19 1010 09/27/19 1020  BP: 139/77 (!) 149/87  Pulse: 75 84  Resp: 15 16  Temp: 36.7 C 36.6 C  SpO2: 93% 97%    Last Pain:  Vitals:   09/27/19 1020  TempSrc:   PainSc: 4                  Guadalupe Kerekes

## 2019-09-27 NOTE — Op Note (Signed)
    Patient name: Dylan Chandler MRN: [id number] DOB: May 29, 1966 Sex: male  09/27/2019 Pre-operative Diagnosis: Nonmaturing right radiocephalic fistula Post-operative diagnosis:  Same Surgeon:  Annamarie Major Assistants: None Procedure:   Branch ligation x4, right radiocephalic fistula Anesthesia: MAC Blood Loss: Minimal Specimens: None  Findings: The vein was of adequate diameter.  There were multiple large branches which were ligated between silk ties.  Indications: The patient is having difficulty with cannulation of his fistula.  He recently underwent a fistulogram and had venoplasty with central venous stenosis.  He is also noted to have multiple branches in the distal forearm and he has been scheduled for ligation of these branches.  Procedure:  The patient was identified in the holding area and taken to [facility] [address]  The patient was then placed supine on the table. MAC anesthesia was administered.  The patient was prepped and draped in the usual sterile fashion.  A time out was called and antibiotics were administered.  Ultrasound was used to mark out the location of the branches.  They were clustered together and so I made 1 incision.  Prior to the incision the area was anesthetized with 1% lidocaine.  I dissected out the fistula within the incision and identified for branches there were all isolated and ligated between silk ties.  After this was completed there remained a palpable thrill within the fistula.  The wound was irrigated.  Hemostasis was achieved.  The incision was closed with 2 layers of 3-0 Vicryl followed by Dermabond.  A Ace wrap was placed over the wound.  There were no complications.   Disposition: To PACU stable.   Theotis Burrow, M.D., [facility] [place] Office: [phone] Pager:  [phone]

## 2019-09-28 ENCOUNTER — Encounter (HOSPITAL_COMMUNITY): Payer: Self-pay | Admitting: Surgery

## 2019-10-16 ENCOUNTER — Other Ambulatory Visit: Payer: Self-pay

## 2019-10-16 ENCOUNTER — Ambulatory Visit (INDEPENDENT_AMBULATORY_CARE_PROVIDER_SITE_OTHER): Payer: Self-pay | Admitting: Physician Assistant

## 2019-10-16 VITALS — BP 121/75 | HR 97 | Temp 97.6°F | Resp 16 | Ht 69.0 in | Wt 233.9 lb

## 2019-10-16 DIAGNOSIS — Z992 Dependence on renal dialysis: Secondary | ICD-10-CM

## 2019-10-16 NOTE — Progress Notes (Signed)
    Postoperative Access Visit   History of Present Illness   Dylan Chandler is a 53 y.o. year old male who presents for postoperative follow-up for: right radiocephalic arteriovenous fistula branch ligation by Dr. Trula Slade 09/27/19.  He underwent fistulogram with subclavian vein angioplasty prior to surgery.  He is dialyzing on a MWF schedule at [facility]  Patient states the dialysis technicians are avoiding area of incision.  He believes the fistula is working better since branch ligation.  He still has some swelling in his forearm however this is much improved compared to before outflow angioplasty.  He denies any signs or symptoms of a steal syndrome in his right hand.   Physical Examination   Vitals:   10/16/19 1325  BP: 121/75  Pulse: 97  Resp: 16  Temp: 97.6 F (36.4 C)  TempSrc: Temporal  SpO2: 97%  Weight: 233 lb 14.4 oz (106.1 kg)  Height: 5\' 9"  (1.753 m)   Body mass index is 34.54 kg/m.  right arm Incision is healing well, hand grip is 5/5, sensation in digits is intact, palpable thrill, bruit can be auscultated     Medical Decision Making   Dylan Chandler is a 53 y.o. year old male who presents s/p right radiocephalic arteriovenous fistula branch ligation   Patent radiocephalic fistula with palpable thrill and without signs or symptoms of steal syndrome  I recommended continuing to avoid area of incision for an additional 2 weeks to ensure complete healing of skin incision and underlying tissue  The patient may follow up on a prn basis   Dagoberto Ligas PA-C [facility] [place] Office: [phone]  Clinic MD: Dr. Scot Dock

## 2019-10-25 ENCOUNTER — Other Ambulatory Visit: Payer: Self-pay

## 2019-10-25 DIAGNOSIS — E230 Hypopituitarism: Secondary | ICD-10-CM

## 2019-10-25 NOTE — Telephone Encounter (Signed)
Called and left patient a VM asking for him to please return my call.  

## 2019-10-25 NOTE — Telephone Encounter (Signed)
Can we find out if he has enough to make it to his appointment

## 2019-10-25 NOTE — Telephone Encounter (Signed)
Patient last seen 07/03/19 and has appointment 11/04/19

## 2019-10-28 NOTE — Telephone Encounter (Signed)
Called and LVM asking for patient to please return my call.  

## 2019-10-28 NOTE — Telephone Encounter (Signed)
Patient returned my call. States he has enough of his medication to get to his appointment next week.

## 2019-10-29 ENCOUNTER — Other Ambulatory Visit: Payer: Self-pay

## 2019-10-29 DIAGNOSIS — E230 Hypopituitarism: Secondary | ICD-10-CM

## 2019-10-29 DIAGNOSIS — F329 Major depressive disorder, single episode, unspecified: Secondary | ICD-10-CM

## 2019-10-29 DIAGNOSIS — F32A Depression, unspecified: Secondary | ICD-10-CM

## 2019-10-29 NOTE — Telephone Encounter (Addendum)
Medication refill and 90 day supply request for Nefazodone 150mg  and Liothyronine 35mcg.  LOV: 07/03/2019 Upcoming appt with Dr. Wynetta Emery 11/04/2019

## 2019-10-29 NOTE — Telephone Encounter (Deleted)
Medication refill request for Liothyronine 25mg .  LOV: 07/03/2019 Upcoming appt 11/04/2019 with Dr. Wynetta Emery.

## 2019-10-29 NOTE — Telephone Encounter (Signed)
Can we see if he has enough to get to his appointment in 6 days

## 2019-11-04 ENCOUNTER — Encounter: Payer: Self-pay | Admitting: Family Medicine

## 2019-11-04 ENCOUNTER — Telehealth: Payer: Self-pay | Admitting: Family Medicine

## 2019-11-04 DIAGNOSIS — F32A Depression, unspecified: Secondary | ICD-10-CM

## 2019-11-04 DIAGNOSIS — F329 Major depressive disorder, single episode, unspecified: Secondary | ICD-10-CM

## 2019-11-04 DIAGNOSIS — E230 Hypopituitarism: Secondary | ICD-10-CM

## 2019-11-04 NOTE — Telephone Encounter (Signed)
Patient calling to request refills. Patient has upcoming [facility] appointment in January. Patient was scheduled for appointment today but had dialis.      sertraline (ZOLOFT) 100 MG tablet  nefazodone (SERZONE) 150 MG tablet  liothyronine (CYTOMEL) 25 MCG tablet  ALPRAZolam (XANAX) 1 MG tablet  testosterone cypionate (DEPOTESTOSTERONE CYPIONATE) 200 MG/ML injection   Pharmacy:  [id number] - [place], [place] - [address] Phone:  [phone]  Fax:  [phone]

## 2019-11-05 MED ORDER — SERTRALINE HCL 100 MG PO TABS: 200.0000 mg | ORAL_TABLET | Freq: Every day | ORAL | 0 refills | Status: DC

## 2019-11-05 MED ORDER — LIOTHYRONINE SODIUM 25 MCG PO TABS: 75.0000 ug | ORAL_TABLET | Freq: Every day | ORAL | 0 refills | Status: DC

## 2019-11-05 MED ORDER — NEFAZODONE HCL 150 MG PO TABS: 150.0000 mg | ORAL_TABLET | Freq: Two times a day (BID) | ORAL | 0 refills | Status: DC

## 2019-11-05 NOTE — Telephone Encounter (Signed)
I will refill his cytomel and his serzone. I will not write 400mg  of sertraline a day- I will only write 200 mg. He should not be due for either testosterone or alprazolam.

## 2019-11-05 NOTE — Telephone Encounter (Signed)
Routing to provider  

## 2019-11-20 MED ORDER — SODIUM BICARBONATE 650 MG PO TABS
1300.00 | ORAL_TABLET | ORAL | Status: DC
Start: 2019-11-20 — End: 2019-11-20

## 2019-11-20 MED ORDER — RAMIPRIL 10 MG PO CAPS
10.00 | ORAL_CAPSULE | ORAL | Status: DC
Start: 2019-11-20 — End: 2019-11-20

## 2019-11-20 MED ORDER — GENERIC EXTERNAL MEDICATION
Status: DC
Start: ? — End: 2019-11-20

## 2019-11-20 MED ORDER — ALPRAZOLAM 0.5 MG PO TABS
1.00 | ORAL_TABLET | ORAL | Status: DC
Start: 2019-11-20 — End: 2019-11-20

## 2019-11-20 MED ORDER — AMOXICILLIN-POT CLAVULANATE 500-125 MG PO TABS
500.00 | ORAL_TABLET | ORAL | Status: DC
Start: 2019-11-20 — End: 2019-11-20

## 2019-11-20 MED ORDER — NEFAZODONE HCL 150 MG PO TABS
150.00 | ORAL_TABLET | ORAL | Status: DC
Start: 2019-11-20 — End: 2019-11-20

## 2019-11-20 MED ORDER — LIOTHYRONINE SODIUM 25 MCG PO TABS
50.00 | ORAL_TABLET | ORAL | Status: DC
Start: 2019-11-21 — End: 2019-11-20

## 2019-11-20 MED ORDER — ROSUVASTATIN CALCIUM 10 MG PO TABS
5.00 | ORAL_TABLET | ORAL | Status: DC
Start: 2019-11-22 — End: 2019-11-20

## 2019-11-20 MED ORDER — NITROGLYCERIN 0.4 MG SL SUBL
0.40 | SUBLINGUAL_TABLET | SUBLINGUAL | Status: DC
Start: ? — End: 2019-11-20

## 2019-11-20 MED ORDER — CALCITRIOL 0.25 MCG PO CAPS
0.25 | ORAL_CAPSULE | ORAL | Status: DC
Start: 2019-11-21 — End: 2019-11-20

## 2019-11-20 MED ORDER — BENZONATATE 100 MG PO CAPS
100.00 | ORAL_CAPSULE | ORAL | Status: DC
Start: 2019-11-20 — End: 2019-11-20

## 2019-11-20 MED ORDER — ASPIRIN 81 MG PO TBEC
81.00 | DELAYED_RELEASE_TABLET | ORAL | Status: DC
Start: 2019-11-21 — End: 2019-11-20

## 2019-11-20 MED ORDER — HEPARIN SODIUM (PORCINE) 5000 UNIT/ML IJ SOLN
5000.00 | INTRAMUSCULAR | Status: DC
Start: 2019-11-20 — End: 2019-11-20

## 2019-11-20 MED ORDER — ACETAMINOPHEN 325 MG PO TABS
650.00 | ORAL_TABLET | ORAL | Status: DC
Start: ? — End: 2019-11-20

## 2019-11-20 MED ORDER — SERTRALINE HCL 50 MG PO TABS
200.00 | ORAL_TABLET | ORAL | Status: DC
Start: 2019-11-20 — End: 2019-11-20

## 2019-11-20 MED ORDER — TORSEMIDE 20 MG PO TABS
40.00 | ORAL_TABLET | ORAL | Status: DC
Start: ? — End: 2019-11-20

## 2019-11-20 MED ORDER — LIDOCAINE HCL 1 % IJ SOLN
0.50 | INTRAMUSCULAR | Status: DC
Start: ? — End: 2019-11-20

## 2019-11-20 MED ORDER — GENERIC EXTERNAL MEDICATION
12.50 | Status: DC
Start: 2019-11-20 — End: 2019-11-20

## 2019-11-20 MED ORDER — DOXYCYCLINE MONOHYDRATE 100 MG PO TABS
100.00 | ORAL_TABLET | ORAL | Status: DC
Start: 2019-11-20 — End: 2019-11-20

## 2019-11-22 MED ORDER — GENERIC EXTERNAL MEDICATION
Status: DC
Start: ? — End: 2019-11-22

## 2019-11-29 ENCOUNTER — Other Ambulatory Visit: Payer: Self-pay | Admitting: Family Medicine

## 2019-11-29 DIAGNOSIS — F32A Depression, unspecified: Secondary | ICD-10-CM

## 2019-11-29 DIAGNOSIS — F329 Major depressive disorder, single episode, unspecified: Secondary | ICD-10-CM

## 2019-12-02 ENCOUNTER — Other Ambulatory Visit: Payer: Self-pay

## 2019-12-02 DIAGNOSIS — I2102 ST elevation (STEMI) myocardial infarction involving left anterior descending coronary artery: Secondary | ICD-10-CM

## 2019-12-02 DIAGNOSIS — E7800 Pure hypercholesterolemia, unspecified: Secondary | ICD-10-CM

## 2019-12-02 NOTE — Telephone Encounter (Signed)
Refill request for Rosuvastatin 5mg . Upcoming appointment 12/13/19

## 2019-12-02 NOTE — Telephone Encounter (Signed)
Please see if he has enough to get to his appointment

## 2019-12-02 NOTE — Telephone Encounter (Signed)
Called patient. Left detailed message for patient to return call to the office.(DPR reviewed) (DPR reviewed)

## 2019-12-03 ENCOUNTER — Other Ambulatory Visit: Payer: Self-pay

## 2019-12-03 DIAGNOSIS — E230 Hypopituitarism: Secondary | ICD-10-CM

## 2019-12-03 NOTE — Telephone Encounter (Signed)
Refill request for Tesosterone Cyp 200 mg/ml. Next OV 12/13/2019

## 2019-12-03 NOTE — Telephone Encounter (Signed)
Can we see if he has enough to last him to his follow up

## 2019-12-03 NOTE — Telephone Encounter (Signed)
Called patient. LVM for patient to return call to the office.

## 2019-12-04 ENCOUNTER — Telehealth: Payer: Self-pay | Admitting: Family Medicine

## 2019-12-04 NOTE — Telephone Encounter (Signed)
LVM for patient to return call. 

## 2019-12-04 NOTE — Telephone Encounter (Signed)
LVM for patient to return phone call.  

## 2019-12-04 NOTE — Telephone Encounter (Signed)
Medication Refill - Medication: testosterone cypionate (DEPOTESTOSTERONE CYPIONATE) 200 MG/ML injection    Preferred Pharmacy (with phone number or street name):  [id number] - [place], [address] Phone:  [phone]  Fax:  [phone]       Agent: Please be advised that RX refills may take up to 3 business days. We ask that you follow-up with your pharmacy.

## 2019-12-04 NOTE — Telephone Encounter (Signed)
Rx has been pended for PCP review

## 2019-12-05 NOTE — Telephone Encounter (Signed)
Called patient. LVM for patient to return call to the office.

## 2019-12-05 NOTE — Telephone Encounter (Signed)
We have reached out to patient and left 4 voicemails. No callback and no answer. Closing encounter. If we sent a letter, the letter would get there by his appointment on 01/29. Will wait for patients appointment. Routing to provider as Juluis Rainier.

## 2019-12-13 ENCOUNTER — Other Ambulatory Visit: Payer: Self-pay

## 2019-12-13 ENCOUNTER — Ambulatory Visit (INDEPENDENT_AMBULATORY_CARE_PROVIDER_SITE_OTHER): Payer: 59 | Admitting: Family Medicine

## 2019-12-13 ENCOUNTER — Encounter: Payer: Self-pay | Admitting: Family Medicine

## 2019-12-13 ENCOUNTER — Other Ambulatory Visit: Payer: Self-pay | Admitting: Family Medicine

## 2019-12-13 VITALS — BP 124/86 | HR 115 | Temp 97.7°F | Ht 68.31 in | Wt 228.8 lb

## 2019-12-13 DIAGNOSIS — E079 Disorder of thyroid, unspecified: Secondary | ICD-10-CM

## 2019-12-13 DIAGNOSIS — E230 Hypopituitarism: Secondary | ICD-10-CM | POA: Diagnosis not present

## 2019-12-13 DIAGNOSIS — Z992 Dependence on renal dialysis: Secondary | ICD-10-CM

## 2019-12-13 DIAGNOSIS — E790 Hyperuricemia without signs of inflammatory arthritis and tophaceous disease: Secondary | ICD-10-CM

## 2019-12-13 DIAGNOSIS — E7800 Pure hypercholesterolemia, unspecified: Secondary | ICD-10-CM

## 2019-12-13 DIAGNOSIS — N185 Chronic kidney disease, stage 5: Secondary | ICD-10-CM

## 2019-12-13 DIAGNOSIS — N2581 Secondary hyperparathyroidism of renal origin: Secondary | ICD-10-CM | POA: Diagnosis not present

## 2019-12-13 DIAGNOSIS — F419 Anxiety disorder, unspecified: Secondary | ICD-10-CM

## 2019-12-13 DIAGNOSIS — F339 Major depressive disorder, recurrent, unspecified: Secondary | ICD-10-CM

## 2019-12-13 DIAGNOSIS — I129 Hypertensive chronic kidney disease with stage 1 through stage 4 chronic kidney disease, or unspecified chronic kidney disease: Secondary | ICD-10-CM | POA: Diagnosis not present

## 2019-12-13 DIAGNOSIS — Z0000 Encounter for general adult medical examination without abnormal findings: Secondary | ICD-10-CM | POA: Diagnosis not present

## 2019-12-13 DIAGNOSIS — I120 Hypertensive chronic kidney disease with stage 5 chronic kidney disease or end stage renal disease: Secondary | ICD-10-CM

## 2019-12-13 LAB — CBC WITH DIFFERENTIAL/PLATELET
Hematocrit: 36 % — ABNORMAL LOW (ref 37.5–51.0)
Hemoglobin: 12.3 g/dL — ABNORMAL LOW (ref 13.0–17.7)
Lymphocytes Absolute: 1.7 10*3/uL (ref 0.7–3.1)
Lymphs: 23 %
MCH: 31.4 pg (ref 26.6–33.0)
MCHC: 34.2 g/dL (ref 31.5–35.7)
MCV: 92 fL (ref 79–97)
MID (Absolute): 0.9 10*3/uL (ref 0.1–1.6)
MID: 11 %
Neutrophils Absolute: 5 10*3/uL (ref 1.4–7.0)
Neutrophils: 66 %
Platelets: 150 10*3/uL (ref 150–450)
RBC: 3.92 x10E6/uL — ABNORMAL LOW (ref 4.14–5.80)
RDW: 12.8 % (ref 11.6–15.4)
WBC: 7.6 10*3/uL (ref 3.4–10.8)

## 2019-12-13 MED ORDER — NEFAZODONE HCL 150 MG PO TABS: 150.0000 mg | ORAL_TABLET | Freq: Two times a day (BID) | ORAL | 1 refills | Status: DC

## 2019-12-13 MED ORDER — ROSUVASTATIN CALCIUM 5 MG PO TABS: 5.0000 mg | ORAL_TABLET | ORAL | 1 refills | Status: DC

## 2019-12-13 MED ORDER — RAMIPRIL 10 MG PO CAPS: 10.0000 mg | ORAL_CAPSULE | Freq: Every evening | ORAL | 1 refills | Status: DC

## 2019-12-13 MED ORDER — METOPROLOL TARTRATE 25 MG PO TABS: 25.0000 mg | ORAL_TABLET | Freq: Two times a day (BID) | ORAL | 1 refills | Status: DC

## 2019-12-13 NOTE — Progress Notes (Signed)
BP 124/86 (BP Location: Left Arm, Patient Position: Sitting, Cuff Size: Normal)   Pulse (!) 115   Temp 97.7 F (36.5 C) (Oral)   Ht 5' 8.31" (1.735 m)   Wt 228 lb 12.8 oz (103.8 kg)   SpO2 99%   BMI 34.48 kg/m    Subjective:    Patient ID: Dylan Chandler, male    DOB: 1966-04-25, 54 y.o.   MRN: [id number]  HPI: Dylan Chandler is a 54 y.o. male presenting on 12/13/2019 for comprehensive medical examination. Current medical complaints include:  Has been on dialysis for a little bit. Had to change nephrologists. Has only been able to tolerate 3 hours of dialysis, doing 3x a week dialysis without feeling really poorly.   HYPERTENSION / HYPERLIPIDEMIA Satisfied with current treatment? no Duration of hypertension: chronic BP monitoring frequency: a few times a week BP medication side effects: no Past BP meds: metoprolol Duration of hyperlipidemia: chronic Cholesterol medication side effects: no Cholesterol supplements: none Past cholesterol medications: crestor Medication compliance: excellent compliance Aspirin: yes Recent stressors: yes Recurrent headaches: no Visual changes: no Palpitations: no Dyspnea: no Chest pain: no Lower extremity edema: no Dizzy/lightheaded: no  DEPRESSION- states that he saw a psychiatrist years ago who figured out that he needed to take 400mg  of sertraline and that seems like it's the only thing that keeps him feeling functional. He states that he's never had a problem with it.  Mood status: controlled Satisfied with current treatment?: yes Symptom severity: severe  Duration of current treatment : chronic Side effects: no Medication compliance: excellent compliance Psychotherapy/counseling: no  Previous psychiatric medications: sertraline (1992), serozone (about that time) Depressed mood: no Anxious mood: yes- creeping up after dialysis Anhedonia: no Significant weight loss or gain: no Insomnia: no  Fatigue: yes Feelings of  worthlessness or guilt: no Impaired concentration/indecisiveness: no Suicidal ideations: no Hopelessness: no Crying spells: no Depression screen [facility] 2/9 10/31/2018 04/23/2018 10/12/2017 10/04/2017 04/06/2016  Decreased Interest - 0 0 0 0  Down, Depressed, Hopeless - 0 0 0 0  PHQ - 2 Score - 0 0 0 0  Altered sleeping 2 - - - 2  Tired, decreased energy 2 - - - 2  Change in appetite 1 - - - 0  Feeling bad or failure about yourself  0 - - - 1  Trouble concentrating 0 - - - 0  Moving slowly or fidgety/restless 0 - - - 0  Suicidal thoughts - - - - 0  PHQ-9 Score - - - - 5   HYPOTHYROIDISM Thyroid control status:stable Satisfied with current treatment? yes Medication side effects: no Medication compliance: excellent compliance Recent dose adjustment:no Fatigue: no Cold intolerance: yes Heat intolerance: no Weight gain: no Weight loss: no Constipation: no Diarrhea/loose stools: no Palpitations: no Lower extremity edema: no Anxiety/depressed mood: yes  LOW TESTOSTERONE Duration: chronic Status: controlled  Satisfied with current treatment:  yes Previous testosterone therapies: Medication side effects:  no Medication compliance: excellent compliance Decreased libido: no Fatigue: no Depressed mood: no Muscle weakness: no Erectile dysfunction: no  Interim Problems from his last visit: yes  Depression Screen done today and results listed below:  Depression screen [facility] 2/9 10/31/2018 04/23/2018 10/12/2017 10/04/2017 04/06/2016  Decreased Interest - 0 0 0 0  Down, Depressed, Hopeless - 0 0 0 0  PHQ - 2 Score - 0 0 0 0  Altered sleeping 2 - - - 2  Tired, decreased energy 2 - - - 2  Change in appetite  1 - - - 0  Feeling bad or failure about yourself  0 - - - 1  Trouble concentrating 0 - - - 0  Moving slowly or fidgety/restless 0 - - - 0  Suicidal thoughts - - - - 0  PHQ-9 Score - - - - 5    Past Medical History:  Past Medical History:  Diagnosis Date  . Anxiety   . CAD  (coronary artery disease)   . Cardiac murmur   . Chronic kidney disease   . Depression   . Elevated SGOT   . Hyperlipidemia   . Hypertension   . 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 Hypothyroidism   . Myocardial infarction ([place]) 2008, 2017  . Sleep apnea   . Thyroid disease     Surgical History:  Past Surgical History:  Procedure Laterality Date  . A/V FISTULAGRAM Right 08/30/2019   Procedure: A/V FISTULAGRAM;  Surgeon: Angelia Mould, MD;  Location: [place] [facility];  Service: Cardiovascular;  Laterality: Right;  . AV FISTULA PLACEMENT Right 07/31/2018   Procedure: Right Radiocephalic ARTERIOVENOUS (AV) FISTULA CREATION;  Surgeon: Angelia Mould, MD;  Location: [place];  Service: Vascular;  Laterality: Right;  . CARDIAC CATHETERIZATION N/A 06/09/2016   Procedure: Left Heart Cath and Coronary Angiography;  Surgeon: Burnell Blanks, MD;  Location: [place] [facility];  Service: Cardiovascular;  Laterality: N/A;  . CARDIAC CATHETERIZATION N/A 06/09/2016   Procedure: Coronary Stent Intervention;  Surgeon: Burnell Blanks, MD;  Location: [place] [facility];  Service: Cardiovascular;  Laterality: N/A;  . KNEE ARTHROSCOPY Left   . LASER ABLATION Right    leg  . LIGATION OF ARTERIOVENOUS  FISTULA Right 09/27/2019   Procedure: LIGATION OF MULTIPLE COMPETING BRANCHES RIGHT RADIOCEPHALIC ARTERIOVENOUS FISTULA;  Surgeon: Serafina Mitchell, MD;  Location: [place];  Service: Vascular;  Laterality: Right;  . PERIPHERAL VASCULAR BALLOON ANGIOPLASTY  08/30/2019   Procedure: PERIPHERAL VASCULAR BALLOON ANGIOPLASTY;  Surgeon: Angelia Mould, MD;  Location: [place] [facility];  Service: Cardiovascular;;  . phlebectomy Right    leg  . SHOULDER SURGERY Right     Medications:  Current Outpatient Medications on File Prior to Visit  Medication Sig  . ALPRAZolam (XANAX) 1 MG tablet Take 1 in AM 1/2 at 4pm and 1 bedtime (Patient taking differently: Take 0.5-1 mg by  mouth See admin instructions. Take 1 mg in the morning 0.5 mg at 4 pm and 1 mg at bedtime)  . aspirin EC 81 MG tablet Take 243 mg by mouth every evening.   . calcitRIOL (ROCALTROL) 0.25 MCG capsule Take 0.5 mcg by mouth every evening.  Marland Kitchen ibuprofen (ADVIL) 200 MG tablet Take 800 mg by mouth 2 (two) times daily as needed for headache or moderate pain.  Marland Kitchen lidocaine-prilocaine (EMLA) cream Apply 1 application topically daily as needed (port access).   . sertraline (ZOLOFT) 100 MG tablet Take 2 tablets (200 mg total) by mouth daily. (Patient taking differently: Take 200 mg by mouth 2 (two) times daily. )  . sildenafil (VIAGRA) 100 MG tablet Take 20 mg by mouth 2 (two) times daily.   . sodium bicarbonate 650 MG tablet Take 1,300 mg by mouth 3 (three) times daily.   Marland Kitchen testosterone cypionate (DEPOTESTOSTERONE CYPIONATE) 200 MG/ML injection INJECT 0.5ML INTO THE MUSCLE ONCE A WEEK (Patient taking differently: Inject 100 mg into the muscle every Wednesday. )  . torsemide (DEMADEX) 20 MG tablet Take 40 mg by mouth daily.  Marland Kitchen  Zinc 50 MG CAPS Take 50 mg by mouth every evening.  . nitroGLYCERIN (NITROSTAT) 0.4 MG SL tablet PLACE ONE TABLET UNDER THE TONGUE AS NEEDED FOR CHEST PAIN (Patient not taking: No sig reported)   No current facility-administered medications on file prior to visit.    Allergies:  Allergies  Allergen Reactions  . Amlodipine Swelling  . Metoprolol Tartrate Other (See Comments)    fatague  . Renvela [Sevelamer] Nausea Only    Extreme constipation    Social History:  Social History   Socioeconomic History  . Marital status: Married    Spouse name: Not on file  . Number of children: Not on file  . Years of education: Not on file  . Highest education level: Not on file  Occupational History  . Not on file  Tobacco Use  . Smoking status: Never Smoker  . Smokeless tobacco: Never Used  Substance and Sexual Activity  . Alcohol use: No  . Drug use: No  . Sexual activity: Not  on file  Other Topics Concern  . Not on file  Social History Narrative  . Not on file   Social Determinants of Health   Financial Resource Strain:   . Difficulty of Paying Living Expenses: Not on file  Food Insecurity:   . Worried About Charity fundraiser in the Last Year: Not on file  . Ran Out of Food in the Last Year: Not on file  Transportation Needs:   . Lack of Transportation (Medical): Not on file  . Lack of Transportation (Non-Medical): Not on file  Physical Activity:   . Days of Exercise per Week: Not on file  . Minutes of Exercise per Session: Not on file  Stress:   . Feeling of Stress : Not on file  Social Connections:   . Frequency of Communication with Friends and Family: Not on file  . Frequency of Social Gatherings with Friends and Family: Not on file  . Attends Religious Services: Not on file  . Active Member of Clubs or Organizations: Not on file  . Attends Archivist Meetings: Not on file  . Marital Status: Not on file  Intimate Partner Violence:   . Fear of Current or Ex-Partner: Not on file  . Emotionally Abused: Not on file  . Physically Abused: Not on file  . Sexually Abused: Not on file   Social History   Tobacco Use  Smoking Status Never Smoker  Smokeless Tobacco Never Used   Social History   Substance and Sexual Activity  Alcohol Use No    Family History:  Family History  Problem Relation Age of Onset  . Cancer Mother   . Hyperlipidemia Mother   . Cancer Father   . Hyperlipidemia Father   . AAA (abdominal aortic aneurysm) Father     Past medical history, surgical history, medications, allergies, family history and social history reviewed with patient today and changes made to appropriate areas of the chart.   Review of Systems  Constitutional: Negative.   HENT: Negative.   Eyes: Negative.   Respiratory: Negative.   Cardiovascular: Positive for palpitations. Negative for chest pain, orthopnea, claudication, leg swelling  and PND.  Gastrointestinal: Negative.   Genitourinary: Negative.  Negative for dysuria, flank pain, frequency, hematuria and urgency.  Musculoskeletal: Negative.   Skin: Negative.   Neurological: Negative.   Endo/Heme/Allergies: Negative.   Psychiatric/Behavioral: Negative for depression, hallucinations, memory loss, substance abuse and suicidal ideas. The patient is nervous/anxious. The patient does  not have insomnia.     All other ROS negative except what is listed above and in the HPI.      Objective:    BP 124/86 (BP Location: Left Arm, Patient Position: Sitting, Cuff Size: Normal)   Pulse (!) 115   Temp 97.7 F (36.5 C) (Oral)   Ht 5' 8.31" (1.735 m)   Wt 228 lb 12.8 oz (103.8 kg)   SpO2 99%   BMI 34.48 kg/m   Wt Readings from Last 3 Encounters:  12/13/19 228 lb 12.8 oz (103.8 kg)  10/16/19 233 lb 14.4 oz (106.1 kg)  09/27/19 235 lb (106.6 kg)    Physical Exam Vitals and nursing note reviewed.  Constitutional:      General: He is not in acute distress.    Appearance: Normal appearance. He is normal weight. He is not ill-appearing, toxic-appearing or diaphoretic.  HENT:     Head: Normocephalic and atraumatic.     Right Ear: Tympanic membrane, ear canal and external ear normal. There is no impacted cerumen.     Left Ear: Tympanic membrane, ear canal and external ear normal. There is no impacted cerumen.     Nose: Nose normal. No congestion or rhinorrhea.     Mouth/Throat:     Mouth: Mucous membranes are moist.     Pharynx: Oropharynx is clear. No oropharyngeal exudate or posterior oropharyngeal erythema.  Eyes:     General: No scleral icterus.       Right eye: No discharge.        Left eye: No discharge.     Extraocular Movements: Extraocular movements intact.     Conjunctiva/sclera: Conjunctivae normal.     Pupils: Pupils are equal, round, and reactive to light.  Neck:     Vascular: No carotid bruit.  Cardiovascular:     Rate and Rhythm: Normal rate and regular  rhythm.     Pulses: Normal pulses.     Heart sounds: No murmur. No friction rub. No gallop.   Pulmonary:     Effort: Pulmonary effort is normal. No respiratory distress.     Breath sounds: Normal breath sounds. No stridor. No wheezing, rhonchi or rales.  Chest:     Chest wall: No tenderness.  Abdominal:     General: Abdomen is flat. Bowel sounds are normal. There is no distension.     Palpations: Abdomen is soft. There is no mass.     Tenderness: There is no abdominal tenderness. There is no right CVA tenderness, left CVA tenderness, guarding or rebound.     Hernia: No hernia is present.  Genitourinary:    Comments: Genital exam deferred with shared decision making Musculoskeletal:        General: No swelling, tenderness, deformity or signs of injury.     Cervical back: Normal range of motion and neck supple. No rigidity. No muscular tenderness.     Right lower leg: No edema.     Left lower leg: No edema.  Lymphadenopathy:     Cervical: No cervical adenopathy.  Skin:    General: Skin is warm and dry.     Capillary Refill: Capillary refill takes less than 2 seconds.     Coloration: Skin is not jaundiced or pale.     Findings: No bruising, erythema, lesion or rash.  Neurological:     General: No focal deficit present.     Mental Status: He is alert and oriented to person, place, and time.     Cranial Nerves:  No cranial nerve deficit.     Sensory: No sensory deficit.     Motor: No weakness.     Coordination: Coordination normal.     Gait: Gait normal.     Deep Tendon Reflexes: Reflexes normal.  Psychiatric:        Mood and Affect: Mood normal.        Behavior: Behavior normal.        Thought Content: Thought content normal.        Judgment: Judgment normal.     Results for orders placed or performed in visit on 12/13/19  CBC With Differential/Platelet  Result Value Ref Range   WBC 7.6 3.4 - 10.8 x10E3/uL   RBC 3.92 (L) 4.14 - 5.80 x10E6/uL   Hemoglobin 12.3 (L) 13.0 -  17.7 g/dL   Hematocrit 36.0 (L) 37.5 - 51.0 %   MCV 92 79 - 97 fL   MCH 31.4 26.6 - 33.0 pg   MCHC 34.2 31.5 - 35.7 g/dL   RDW 12.8 11.6 - 15.4 %   Platelets 150 150 - 450 x10E3/uL   Neutrophils 66 Not Estab. %   Lymphs 23 Not Estab. %   MID 11 Not Estab. %   Neutrophils Absolute 5.0 1.4 - 7.0 x10E3/uL   Lymphocytes Absolute 1.7 0.7 - 3.1 x10E3/uL   MID (Absolute) 0.9 0.1 - 1.6 X10E3/uL      Assessment & Plan:   Problem List Items Addressed This Visit      Cardiovascular and Mediastinum   Hypertension    Under good control on current regimen. Continue current regimen. Continue to monitor. Call with any concerns. Refills given. Labs drawn today       Relevant Medications   ramipril (ALTACE) 10 MG capsule   rosuvastatin (CRESTOR) 5 MG tablet   metoprolol tartrate (LOPRESSOR) 25 MG tablet     Endocrine   Thyroid disease    Rechecking labs today. Await results- treat as needed.       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 Stable. Continue current regimen using 100mg  every week. Continue to monitor. Call with any concerns.       Relevant Orders   Comprehensive metabolic panel   PSA   CBC With Differential/Platelet (Completed)   Secondary hyperparathyroidism of renal origin [facility])    Following with nephrology. Continue to monitor. Drawing PTH today. Call with any concerns.       Relevant Orders   Comprehensive metabolic panel   CBC With Differential/Platelet (Completed)   PTH, Intact and Calcium     Genitourinary   CKD stage 5 secondary to hypertension [facility])    Following with nephrology. Continue to monitor. Drawing PTH today. Call with any concerns.       Relevant Orders   Comprehensive metabolic panel   UA/M w/rflx Culture, Routine   CBC With  Differential/Platelet (Completed)     Other   Hyperlipidemia    Under good control on current regimen. Continue current regimen. Continue to monitor. Call with any concerns. Refills given. Labs drawn today       Relevant Medications   ramipril (ALTACE) 10 MG capsule   rosuvastatin (CRESTOR) 5 MG tablet   metoprolol tartrate (LOPRESSOR) 25 MG tablet   Other Relevant Orders   Comprehensive metabolic panel  Lipid Panel w/o Chol/HDL Ratio   CBC With Differential/Platelet (Completed)   Depression, recurrent ([facility])    He's stable on very high doses of sertraline and serzone combination with PRN lorazepam for anxiety- will get him into psychiatry as I'm not comfortable writing 400mg  of sertraline daily. 200mg  sertraline refilled today. Serzone refilled today. Not due for lorazepam. Call with any concerns and recheck 3 months if not in with psychiatry.       Relevant Medications   nefazodone (SERZONE) 150 MG tablet   Other Relevant Orders   Comprehensive metabolic panel   CBC With Differential/Platelet (Completed)   Ambulatory referral to Psychiatry   Elevated uric acid in blood    Rechecking labs today. Await results. Call with any concerns.       Relevant Orders   Comprehensive metabolic panel   Uric acid   CBC With Differential/Platelet (Completed)   Anxiety    He's stable on very high doses of sertraline and serzone combination with PRN lorazepam for anxiety- will get him into psychiatry as I'm not comfortable writing 400mg  of sertraline daily. 200mg  sertraline refilled today. Serzone refilled today. Not due for lorazepam. Call with any concerns and recheck 3 months if not in with psychiatry.       Relevant Medications   nefazodone (SERZONE) 150 MG tablet   Dialysis patient [facility])    Following with nephrology. Continue to monitor. Drawing PTH today. Call with any concerns.       Relevant Orders   Comprehensive metabolic panel   CBC With Differential/Platelet (Completed)      Other Visit Diagnoses    Routine general medical examination at a health care facility    -  Primary   Vaccines up to date. Screening labs checked today. Will call with Dr. for Colonoscopy. Continue diet and exercise. Call with any concerns.    Relevant Orders   Comprehensive metabolic panel   Lipid Panel w/o Chol/HDL Ratio   PSA   TSH   UA/M w/rflx Culture, Routine   Uric acid      Discussed aspirin prophylaxis for myocardial infarction prevention and decision was made to continue ASA  LABORATORY TESTING:  Health maintenance labs ordered today as discussed above.   The natural history of prostate cancer and ongoing controversy regarding screening and potential treatment outcomes of prostate cancer has been discussed with the patient. The meaning of a false positive PSA and a false negative PSA has been discussed. He indicates understanding of the limitations of this screening test and wishes to proceed with screening PSA testing.  IMMUNIZATIONS:   - Tdap: Tetanus vaccination status reviewed: last tetanus booster within 10 years. - Influenza: Declined - Pneumovax: Refused  SCREENING: - Colonoscopy: Ordered today  Discussed with patient purpose of the colonoscopy is to detect colon cancer at curable precancerous or early stages   PATIENT COUNSELING:    Sexuality: Discussed sexually transmitted diseases, partner selection, use of condoms, avoidance of unintended pregnancy  and contraceptive alternatives.   Advised to avoid cigarette smoking.  I discussed with the patient that most people either abstain from alcohol or drink within safe limits (<=14/week and <=4 drinks/occasion for males, <=7/weeks and <= 3 drinks/occasion for females) and that the risk for alcohol disorders and other health effects rises proportionally with the number of drinks per week and how often a drinker exceeds daily limits.  Discussed cessation/primary prevention of drug use and availability of treatment  for abuse.   Diet: Encouraged to adjust caloric intake  to maintain  or achieve ideal body weight, to reduce intake of dietary saturated fat and total fat, to limit sodium intake by avoiding high sodium foods and not adding table salt, and to maintain adequate dietary potassium and calcium preferably from fresh fruits, vegetables, and low-fat dairy products.    stressed the importance of regular exercise  Injury prevention: Discussed safety belts, safety helmets, smoke detector, smoking near bedding or upholstery.   Dental health: Discussed importance of regular tooth brushing, flossing, and dental visits.   Follow up plan: NEXT PREVENTATIVE PHYSICAL DUE IN 1 YEAR. Return in about 3 months (around 03/12/2020) for mood if not with psych.

## 2019-12-13 NOTE — Patient Instructions (Signed)

## 2019-12-14 ENCOUNTER — Other Ambulatory Visit: Payer: Self-pay | Admitting: Family Medicine

## 2019-12-14 ENCOUNTER — Encounter: Payer: Self-pay | Admitting: Family Medicine

## 2019-12-14 DIAGNOSIS — E079 Disorder of thyroid, unspecified: Secondary | ICD-10-CM

## 2019-12-14 DIAGNOSIS — E230 Hypopituitarism: Secondary | ICD-10-CM

## 2019-12-14 LAB — COMPREHENSIVE METABOLIC PANEL
ALT: 93 IU/L — ABNORMAL HIGH (ref 0–44)
AST: 39 IU/L (ref 0–40)
Albumin/Globulin Ratio: 2 (ref 1.2–2.2)
Albumin: 3.9 g/dL (ref 3.8–4.9)
Alkaline Phosphatase: 93 IU/L (ref 39–117)
BUN/Creatinine Ratio: 8 — ABNORMAL LOW (ref 9–20)
BUN: 70 mg/dL — ABNORMAL HIGH (ref 6–24)
Bilirubin Total: 0.2 mg/dL (ref 0.0–1.2)
CO2: 24 mmol/L (ref 20–29)
Calcium: 9.2 mg/dL (ref 8.7–10.2)
Chloride: 98 mmol/L (ref 96–106)
Creatinine, Ser: 9.08 mg/dL — ABNORMAL HIGH (ref 0.76–1.27)
GFR calc Af Amer: 7 mL/min/{1.73_m2} — ABNORMAL LOW (ref >59)
GFR calc non Af Amer: 6 mL/min/{1.73_m2} — ABNORMAL LOW (ref >59)
Globulin, Total: 2 g/dL (ref 1.5–4.5)
Glucose: 84 mg/dL (ref 65–99)
Potassium: 4.8 mmol/L (ref 3.5–5.2)
Sodium: 138 mmol/L (ref 134–144)
Total Protein: 5.9 g/dL — ABNORMAL LOW (ref 6.0–8.5)

## 2019-12-14 LAB — PTH, INTACT AND CALCIUM: PTH: 32 pg/mL (ref 15–65)

## 2019-12-14 LAB — LIPID PANEL W/O CHOL/HDL RATIO
Cholesterol, Total: 98 mg/dL — ABNORMAL LOW (ref 100–199)
HDL: 32 mg/dL — ABNORMAL LOW (ref >39)
LDL Chol Calc (NIH): 47 mg/dL (ref 0–99)
Triglycerides: 96 mg/dL (ref 0–149)
VLDL Cholesterol Cal: 19 mg/dL (ref 5–40)

## 2019-12-14 LAB — PSA: Prostate Specific Ag, Serum: 0.6 ng/mL (ref 0.0–4.0)

## 2019-12-14 LAB — URIC ACID: Uric Acid: 8.1 mg/dL (ref 3.8–8.4)

## 2019-12-14 LAB — TSH: TSH: 0.1 u[IU]/mL — ABNORMAL LOW (ref 0.450–4.500)

## 2019-12-14 MED ORDER — LIOTHYRONINE SODIUM 25 MCG PO TABS: 50.0000 ug | ORAL_TABLET | Freq: Every day | ORAL | 2 refills | Status: DC

## 2019-12-14 NOTE — Assessment & Plan Note (Signed)
He's stable on very high doses of sertraline and serzone combination with PRN lorazepam for anxiety- will get him into psychiatry as I'm not comfortable writing 400mg  of sertraline daily. 200mg  sertraline refilled today. Serzone refilled today. Not due for lorazepam. Call with any concerns and recheck 3 months if not in with psychiatry.

## 2019-12-14 NOTE — Assessment & Plan Note (Signed)
Following with nephrology. Continue to monitor. Drawing PTH today. Call with any concerns.

## 2019-12-14 NOTE — Assessment & Plan Note (Signed)
Under good control on current regimen. Continue current regimen. Continue to monitor. Call with any concerns. Refills given. Labs drawn today.   

## 2019-12-14 NOTE — Assessment & Plan Note (Signed)
Rechecking labs today. Await results. Call with any concerns.  

## 2019-12-14 NOTE — Assessment & Plan Note (Signed)
Rechecking labs today. Await results treat as needed.  °

## 2019-12-14 NOTE — Assessment & Plan Note (Signed)
Labs checked in the hospital. Stable. Continue current regimen using 100mg  every week. Continue to monitor. Call with any concerns.

## 2019-12-18 MED ORDER — CALCITRIOL 0.25 MCG PO CAPS
0.25 | ORAL_CAPSULE | ORAL | Status: DC
Start: 2019-12-18 — End: 2019-12-18

## 2019-12-18 MED ORDER — LIDOCAINE HCL 1 % IJ SOLN
0.50 | INTRAMUSCULAR | Status: DC
Start: ? — End: 2019-12-18

## 2019-12-18 MED ORDER — APIXABAN 5 MG PO TABS
5.00 | ORAL_TABLET | ORAL | Status: DC
Start: ? — End: 2019-12-18

## 2019-12-18 MED ORDER — CALCIUM CARBONATE ANTACID 750 MG PO CHEW
CHEWABLE_TABLET | ORAL | Status: DC
Start: ? — End: 2019-12-18

## 2019-12-18 MED ORDER — METOPROLOL TARTRATE 50 MG PO TABS
50.00 | ORAL_TABLET | ORAL | Status: DC
Start: 2019-12-18 — End: 2019-12-18

## 2019-12-18 MED ORDER — RAMIPRIL 10 MG PO CAPS
10.00 | ORAL_CAPSULE | ORAL | Status: DC
Start: 2019-12-18 — End: 2019-12-18

## 2019-12-18 MED ORDER — ROSUVASTATIN CALCIUM 10 MG PO TABS
5.00 | ORAL_TABLET | ORAL | Status: DC
Start: 2019-12-19 — End: 2019-12-18

## 2019-12-18 MED ORDER — ALPRAZOLAM 0.5 MG PO TABS
0.50 | ORAL_TABLET | ORAL | Status: DC
Start: 2019-12-17 — End: 2019-12-18

## 2019-12-18 MED ORDER — TRAZODONE HCL 50 MG PO TABS
50.00 | ORAL_TABLET | ORAL | Status: DC
Start: 2019-12-18 — End: 2019-12-18

## 2019-12-18 MED ORDER — ACETAMINOPHEN 325 MG PO TABS
650.00 | ORAL_TABLET | ORAL | Status: DC
Start: ? — End: 2019-12-18

## 2019-12-18 MED ORDER — SODIUM BICARBONATE 650 MG PO TABS
1300.00 | ORAL_TABLET | ORAL | Status: DC
Start: 2019-12-17 — End: 2019-12-18

## 2019-12-18 MED ORDER — TORSEMIDE 20 MG PO TABS
40.00 | ORAL_TABLET | ORAL | Status: DC
Start: 2019-12-18 — End: 2019-12-18

## 2019-12-18 MED ORDER — SERTRALINE HCL 50 MG PO TABS
200.00 | ORAL_TABLET | ORAL | Status: DC
Start: 2019-12-18 — End: 2019-12-18

## 2019-12-18 MED ORDER — LIOTHYRONINE SODIUM 25 MCG PO TABS
50.00 | ORAL_TABLET | ORAL | Status: DC
Start: 2019-12-18 — End: 2019-12-18

## 2019-12-18 MED ORDER — ONDANSETRON HCL 4 MG/2ML IJ SOLN
4.00 | INTRAMUSCULAR | Status: DC
Start: ? — End: 2019-12-18

## 2019-12-22 ENCOUNTER — Encounter: Payer: Self-pay | Admitting: Family Medicine

## 2019-12-24 ENCOUNTER — Other Ambulatory Visit: Payer: Self-pay | Admitting: Family Medicine

## 2019-12-24 DIAGNOSIS — E230 Hypopituitarism: Secondary | ICD-10-CM

## 2019-12-24 MED ORDER — TESTOSTERONE CYPIONATE 200 MG/ML IM SOLN: 100.0000 mg | INTRAMUSCULAR | 5 refills | Status: DC

## 2019-12-24 NOTE — Telephone Encounter (Signed)
Requested medication (s) are due for refill today: yes  Requested medication (s) are on the active medication list: yes  Last refill:  11/07/19  Future visit scheduled: no  Notes to clinic:  not delegated; no assigned protocol    Requested Prescriptions  Pending Prescriptions Disp Refills   testosterone cypionate (DEPOTESTOSTERONE CYPIONATE) 200 MG/ML injection [Pharmacy Med Name: TESTOSTERONE CYP 200 MG/ML] 2 mL     Sig: INJECT 0.5ML INTO THE MUSCLE ONCE A WEEK      Off-Protocol Failed - 12/24/2019  9:21 AM      Failed - Medication not assigned to a protocol, review manually.      Passed - Valid encounter within last 12 months    Recent Outpatient Visits           1 week ago Routine general medical examination at a health care facility   [facility] [place] P, DO   5 months ago Depression, unspecified depression type   [place], Jeannette How, MD   6 months ago Mild episode of recurrent major depressive disorder [facility])   [facility] Cr[place], Jeannette How, MD   1 year ago Encounter for screening for HIV   Olivarez, Jeannette How, MD   1 year ago Essential hypertension   [facility] Crissman, Jeannette How, MD

## 2019-12-24 NOTE — Telephone Encounter (Signed)
Routing to provider  

## 2019-12-28 ENCOUNTER — Encounter: Payer: Self-pay | Admitting: Family Medicine

## 2019-12-30 MED ORDER — METOPROLOL SUCCINATE ER 25 MG PO TB24
50.00 | ORAL_TABLET | ORAL | Status: DC
Start: 2020-01-04 — End: 2019-12-30

## 2019-12-30 MED ORDER — SENNOSIDES-DOCUSATE SODIUM 8.6-50 MG PO TABS
2.00 | ORAL_TABLET | ORAL | Status: DC
Start: 2020-01-05 — End: 2019-12-30

## 2019-12-30 MED ORDER — CALCITRIOL 0.25 MCG PO CAPS
0.25 | ORAL_CAPSULE | ORAL | Status: DC
Start: 2020-01-05 — End: 2019-12-30

## 2019-12-30 MED ORDER — SERTRALINE HCL 50 MG PO TABS
200.00 | ORAL_TABLET | ORAL | Status: DC
Start: 2020-01-04 — End: 2019-12-30

## 2019-12-30 MED ORDER — LIOTHYRONINE SODIUM 25 MCG PO TABS
25.00 | ORAL_TABLET | ORAL | Status: DC
Start: 2020-01-05 — End: 2019-12-30

## 2019-12-30 MED ORDER — LIDOCAINE HCL 1 % IJ SOLN
0.50 | INTRAMUSCULAR | Status: DC
Start: ? — End: 2019-12-30

## 2019-12-30 MED ORDER — SODIUM BICARBONATE 650 MG PO TABS
1300.00 | ORAL_TABLET | ORAL | Status: DC
Start: 2020-01-03 — End: 2019-12-30

## 2019-12-30 MED ORDER — RAMIPRIL 10 MG PO CAPS
10.00 | ORAL_CAPSULE | ORAL | Status: DC
Start: 2020-01-04 — End: 2019-12-30

## 2019-12-30 MED ORDER — ROSUVASTATIN CALCIUM 10 MG PO TABS
5.00 | ORAL_TABLET | ORAL | Status: DC
Start: 2020-01-06 — End: 2019-12-30

## 2019-12-30 MED ORDER — OXYCODONE-ACETAMINOPHEN 5-325 MG PO TABS
1.00 | ORAL_TABLET | ORAL | Status: DC
Start: ? — End: 2019-12-30

## 2019-12-30 MED ORDER — ALPRAZOLAM 0.5 MG PO TABS
1.00 | ORAL_TABLET | ORAL | Status: DC
Start: 2020-01-04 — End: 2019-12-30

## 2019-12-30 MED ORDER — SODIUM CHLORIDE FLUSH 0.9 % IV SOLN
5.00 | INTRAVENOUS | Status: DC
Start: ? — End: 2019-12-30

## 2019-12-30 MED ORDER — HEPARIN SOD (PORCINE) IN D5W 100 UNIT/ML IV SOLN
1200.00 | INTRAVENOUS | Status: DC
Start: ? — End: 2019-12-30

## 2019-12-31 ENCOUNTER — Other Ambulatory Visit: Payer: Self-pay | Admitting: Family Medicine

## 2019-12-31 DIAGNOSIS — F339 Major depressive disorder, recurrent, unspecified: Secondary | ICD-10-CM

## 2020-01-02 MED ORDER — ASPIRIN 81 MG PO TBEC
81.00 | DELAYED_RELEASE_TABLET | ORAL | Status: DC
Start: 2020-01-05 — End: 2020-01-02

## 2020-01-02 MED ORDER — GENERIC EXTERNAL MEDICATION
1.00 | Status: DC
Start: ? — End: 2020-01-02

## 2020-01-02 MED ORDER — ACETAMINOPHEN 325 MG PO TABS
650.00 | ORAL_TABLET | ORAL | Status: DC
Start: ? — End: 2020-01-02

## 2020-01-02 MED ORDER — CLOPIDOGREL BISULFATE 75 MG PO TABS
75.00 | ORAL_TABLET | ORAL | Status: DC
Start: 2020-01-05 — End: 2020-01-02

## 2020-01-02 MED ORDER — NITROGLYCERIN 0.4 MG SL SUBL
0.40 | SUBLINGUAL_TABLET | SUBLINGUAL | Status: DC
Start: ? — End: 2020-01-02

## 2020-01-02 MED ORDER — LIDOCAINE HCL 1 % IJ SOLN
0.50 | INTRAMUSCULAR | Status: DC
Start: ? — End: 2020-01-02

## 2020-01-16 ENCOUNTER — Encounter: Payer: Self-pay | Admitting: Family Medicine

## 2020-01-19 MED ORDER — TRAZODONE HCL 100 MG PO TABS: 100.0000 mg | ORAL_TABLET | Freq: Two times a day (BID) | ORAL | 1 refills | Status: DC

## 2020-01-22 ENCOUNTER — Other Ambulatory Visit: Payer: Self-pay | Admitting: Family Medicine

## 2020-01-22 ENCOUNTER — Telehealth: Payer: Self-pay

## 2020-01-22 DIAGNOSIS — F32A Depression, unspecified: Secondary | ICD-10-CM

## 2020-01-22 DIAGNOSIS — F329 Major depressive disorder, single episode, unspecified: Secondary | ICD-10-CM

## 2020-01-22 MED ORDER — SERTRALINE HCL 100 MG PO TABS: 200.0000 mg | ORAL_TABLET | Freq: Every day | ORAL | 2 refills | Status: DC

## 2020-01-22 MED ORDER — ALPRAZOLAM 1 MG PO TABS: ORAL_TABLET | ORAL | 0 refills | Status: DC

## 2020-01-22 NOTE — Telephone Encounter (Signed)
Please let him know I've given him a month on the xanax, but I do not write TID xanax. It looks like psychiatry has been trying repeatedly to get in touch with him. Please check in with him about him seeing psychiatry.

## 2020-01-22 NOTE — Telephone Encounter (Signed)
LOV 12/13/19

## 2020-01-22 NOTE — Telephone Encounter (Signed)
Called and LVM for patient asking for him to please return my call.  

## 2020-01-22 NOTE — Telephone Encounter (Signed)
Medication Refill - Medication: alprazolam, sertraline   Has the patient contacted their pharmacy? Yes.   (Agent: If no, request that the patient contact the pharmacy for the refill.) (Agent: If yes, when and what did the pharmacy advise?)  Preferred Pharmacy (with phone number or street name):  [id number] - [place], [place] [address]  [address] [place] [address]  Phone: [phone] Fax: [phone]  Not a 24 hour pharmacy; exact hours not known.     Agent: Please be advised that RX refills may take up to 3 business days. We ask that you follow-up with your pharmacy.

## 2020-01-22 NOTE — Telephone Encounter (Signed)
Requested medication (s) are due for refill today: Sertraline, yes  Requested medication (s) are on the active medication list:yes  Last refill:  11/04/2019  Future visit scheduled:   Notes to clinic:  see pharmacy note  Requested medication (s) are due for refill today: Alprazolam, yes  Requested medication (s) are on the active medication list: yes  Last refill:  07/03/2019  Future visit scheduled:   Notes to clinic:  not delegated  Requested Prescriptions  Pending Prescriptions Disp Refills   sertraline (ZOLOFT) 100 MG tablet 60 tablet 0    Sig: Take 2 tablets (200 mg total) by mouth daily.      Psychiatry:  Antidepressants - SSRI Failed - 01/22/2020  9:22 AM      Failed - Completed PHQ-2 or PHQ-9 in the last 360 days.      Passed - Valid encounter within last 6 months    Recent Outpatient Visits           1 month ago Routine general medical examination at a health care facility   [facility], [place] P, DO   6 months ago Depression, unspecified depression type   [place], Jeannette How, MD   7 months ago Mild episode of recurrent major depressive disorder [facility])   [facility] [place], Jeannette How, MD   1 year ago Encounter for screening for HIV   [place], Jeannette How, MD   1 year ago Essential hypertension   [place], Jeannette How, MD                ALPRAZolam (XANAX) 1 MG tablet 75 tablet 5    Sig: Take 1 in AM 1/2 at 4pm and 1 bedtime      Not Delegated - Psychiatry:  Anxiolytics/Hypnotics Failed - 01/22/2020  9:22 AM      Failed - This refill cannot be delegated      Failed - Urine Drug Screen completed in last 360 days.      Passed - Valid encounter within last 6 months    Recent Outpatient Visits           1 month ago Routine general medical examination at a health care facility   [facility], [place] P, DO   6 months ago Depression,  unspecified depression type   [place], Jeannette How, MD   7 months ago Mild episode of recurrent major depressive disorder [facility])   [facility] [place], Jeannette How, MD   1 year ago Encounter for screening for HIV   [place], Jeannette How, MD   1 year ago Essential hypertension   [facility] [place], Jeannette How, MD

## 2020-01-22 NOTE — Telephone Encounter (Signed)
Returned call to patient. Left detailed message to patient about the reason why Jonelle Sidle was calling about. Referral team was attempted to contact patient multiple times and due to no patient's response, they closed the referral for a psychiatry placed back in 01/21 by Dr. Wynetta Emery. (DPR reviewed)  Asked patient to return my call if have any questions about this message. Copied from [place] [id number]. Topic: General - Other >> Jan 22, 2020  9:01 AM Celene Kras wrote: Reason for CRM: Pt called and is requesting to know why tiffany was getting in contact with him. Pt states that he has been receiving messages about a referral but no notes were left for him.

## 2020-01-22 NOTE — Telephone Encounter (Signed)
Patient returned my call. Notified him of Dr, Durenda Age message. Patient states that he is only taking 2 tablets of xanax and will call psychiatry back to get scheduled.

## 2020-02-13 ENCOUNTER — Other Ambulatory Visit: Payer: Self-pay | Admitting: Family Medicine

## 2020-02-13 DIAGNOSIS — E079 Disorder of thyroid, unspecified: Secondary | ICD-10-CM

## 2020-02-13 NOTE — Telephone Encounter (Signed)
Requested medication (s) are due for refill today:yes  Requested medication (s) are on the active medication list: yes  Last refill:  12/13/19  Future visit scheduled: No  Notes to clinic:  Last lab note states patient was to have TSH recheck. Left VM for patient to return call to office. Needs lab appointment.    Requested Prescriptions  Pending Prescriptions Disp Refills   liothyronine (CYTOMEL) 25 MCG tablet [Pharmacy Med Name: LIOTHYRONINE SOD 25 MCG TAB] 60 tablet 2    Sig: Take 2 tablets (50 mcg total) by mouth daily.      Endocrinology:  Hypothyroid Agents Failed - 02/13/2020  2:30 PM      Failed - TSH needs to be rechecked within 3 months after an abnormal result. Refill until TSH is due.      Failed - TSH in normal range and within 360 days    TSH  Date Value Ref Range Status  12/13/2019 0.100 (L) 0.450 - 4.500 uIU/mL Final          Passed - Valid encounter within last 12 months    Recent Outpatient Visits           2 months ago Routine general medical examination at a health care facility   [facility], Megan P, DO   7 months ago Depression, unspecified depression type   [place], Jeannette How, MD   8 months ago Mild episode of recurrent major depressive disorder [facility])   [facility] [place], Jeannette How, MD   1 year ago Encounter for screening for HIV   [place], Jeannette How, MD   1 year ago Essential hypertension   [facility] [place], Jeannette How, MD

## 2020-02-14 NOTE — Telephone Encounter (Signed)
Needs labs

## 2020-02-17 NOTE — Telephone Encounter (Signed)
Pt scheduled for labs tomorrow.

## 2020-02-17 NOTE — Telephone Encounter (Signed)
Will need to wait for labs for refill

## 2020-02-18 ENCOUNTER — Other Ambulatory Visit: Payer: Self-pay

## 2020-02-18 ENCOUNTER — Other Ambulatory Visit: Payer: 59

## 2020-02-18 DIAGNOSIS — E079 Disorder of thyroid, unspecified: Secondary | ICD-10-CM

## 2020-02-19 ENCOUNTER — Other Ambulatory Visit: Payer: Self-pay | Admitting: Family Medicine

## 2020-02-19 DIAGNOSIS — E079 Disorder of thyroid, unspecified: Secondary | ICD-10-CM

## 2020-02-19 LAB — TSH: TSH: 0.71 u[IU]/mL (ref 0.450–4.500)

## 2020-02-19 MED ORDER — LIOTHYRONINE SODIUM 25 MCG PO TABS: 50.0000 ug | ORAL_TABLET | Freq: Every day | ORAL | 3 refills | Status: DC

## 2020-02-21 ENCOUNTER — Other Ambulatory Visit: Payer: Self-pay | Admitting: Family Medicine

## 2020-02-21 DIAGNOSIS — F32A Depression, unspecified: Secondary | ICD-10-CM

## 2020-02-21 DIAGNOSIS — F329 Major depressive disorder, single episode, unspecified: Secondary | ICD-10-CM

## 2020-02-21 NOTE — Telephone Encounter (Signed)
Requested medication (s) are due for refill today: Early  Requested medication (s) are on the active medication list: Yes  Last refill:  01/22/20  Future visit scheduled: No  Notes to clinic:  See request.    Requested Prescriptions  Pending Prescriptions Disp Refills   ALPRAZolam (XANAX) 1 MG tablet [Pharmacy Med Name: ALPRAZOLAM 1 MG TABLET] 75 tablet 0    Sig: TAKE 1 TABLET BY MOUTH IN THE MORNING, 1/2 TABLET AT 4PM AND 1 TABLET AT BEDTIME      Not Delegated - Psychiatry:  Anxiolytics/Hypnotics Failed - 02/21/2020  1:55 PM      Failed - This refill cannot be delegated      Failed - Urine Drug Screen completed in last 360 days.      Passed - Valid encounter within last 6 months    Recent Outpatient Visits           2 months ago Routine general medical examination at a health care facility   [facility], [place] P, DO   7 months ago Depression, unspecified depression type   [place], Jeannette How, MD   8 months ago Mild episode of recurrent major depressive disorder [facility])   [facility] [place], Jeannette How, MD   1 year ago Encounter for screening for HIV   [place], Jeannette How, MD   1 year ago Essential hypertension   [facility] [place], Jeannette How, MD

## 2020-02-21 NOTE — Telephone Encounter (Signed)
Dr. Wynetta Emery, double check behind me, but this is not due yet. Should have about 2 more weeks I believe.

## 2020-02-21 NOTE — Telephone Encounter (Signed)
Patient is due for refill today as it's been 30 days. I will refill, but please check with him about seeing the psychiatrist, as it doesn't look like he has yet. If he has not established with them, I will need him to have an appointment with me at the end of the month.

## 2020-02-21 NOTE — Telephone Encounter (Signed)
Called and LVM asking for patient to please return my call.  

## 2020-02-22 ENCOUNTER — Encounter: Payer: Self-pay | Admitting: Family Medicine

## 2020-02-25 NOTE — Telephone Encounter (Signed)
Called and LVM asking for patient to please return my call.  

## 2020-02-25 NOTE — Telephone Encounter (Signed)
Spoke with patient, he will see Dr.Johnson at his scheduled follow up appt.

## 2020-03-17 ENCOUNTER — Other Ambulatory Visit: Payer: Self-pay

## 2020-03-17 ENCOUNTER — Ambulatory Visit (INDEPENDENT_AMBULATORY_CARE_PROVIDER_SITE_OTHER): Payer: 59 | Admitting: Family Medicine

## 2020-03-17 ENCOUNTER — Other Ambulatory Visit: Payer: Self-pay | Admitting: Family Medicine

## 2020-03-17 ENCOUNTER — Encounter: Payer: Self-pay | Admitting: Family Medicine

## 2020-03-17 VITALS — BP 94/53 | HR 110 | Temp 98.1°F | Wt 220.6 lb

## 2020-03-17 DIAGNOSIS — F329 Major depressive disorder, single episode, unspecified: Secondary | ICD-10-CM

## 2020-03-17 DIAGNOSIS — I10 Essential (primary) hypertension: Secondary | ICD-10-CM

## 2020-03-17 DIAGNOSIS — E230 Hypopituitarism: Secondary | ICD-10-CM

## 2020-03-17 DIAGNOSIS — I129 Hypertensive chronic kidney disease with stage 1 through stage 4 chronic kidney disease, or unspecified chronic kidney disease: Secondary | ICD-10-CM | POA: Diagnosis not present

## 2020-03-17 DIAGNOSIS — F339 Major depressive disorder, recurrent, unspecified: Secondary | ICD-10-CM

## 2020-03-17 DIAGNOSIS — E7800 Pure hypercholesterolemia, unspecified: Secondary | ICD-10-CM

## 2020-03-17 DIAGNOSIS — F32A Depression, unspecified: Secondary | ICD-10-CM

## 2020-03-17 MED ORDER — RAMIPRIL 10 MG PO CAPS: 10.0000 mg | ORAL_CAPSULE | Freq: Every evening | ORAL | 0 refills | Status: DC

## 2020-03-17 MED ORDER — ROSUVASTATIN CALCIUM 5 MG PO TABS: 5.0000 mg | ORAL_TABLET | ORAL | 0 refills | Status: DC

## 2020-03-17 MED ORDER — ALPRAZOLAM 1 MG PO TABS: ORAL_TABLET | ORAL | 2 refills | Status: DC

## 2020-03-17 MED ORDER — TESTOSTERONE CYPIONATE 200 MG/ML IM SOLN: 100.0000 mg | INTRAMUSCULAR | 0 refills | Status: DC

## 2020-03-17 MED ORDER — SERTRALINE HCL 100 MG PO TABS: 200.0000 mg | ORAL_TABLET | Freq: Every day | ORAL | 0 refills | Status: DC

## 2020-03-17 MED ORDER — METOPROLOL TARTRATE 25 MG PO TABS: 25.0000 mg | ORAL_TABLET | Freq: Two times a day (BID) | ORAL | 0 refills | Status: DC

## 2020-03-17 NOTE — Assessment & Plan Note (Signed)
Under good control. Discussed again that I am not comfortable prescribing over the recommended dose of sertraline. He is tolerating the trazodone well. Will get him into psychiatry. New referral generated today. Refills of xanax given today. Call with any concerns. Recheck 3 months. Continue to monitor.

## 2020-03-17 NOTE — Telephone Encounter (Signed)
LOV: 12/13/2019; NOV: 03/17/2020 with [facility].   Last filled 01/22/2020 for 30 days supply with 2 refills.

## 2020-03-17 NOTE — Progress Notes (Signed)
BP (!) 94/53 (BP Location: Left Arm, Patient Position: Sitting, Cuff Size: Normal)   Pulse (!) 110   Temp 98.1 F (36.7 C) (Oral)   Wt 220 lb 9.6 oz (100.1 kg)   SpO2 97%   BMI 33.24 kg/m    Subjective:    Patient ID: Dylan Chandler, male    DOB: 07-08-1966, 54 y.o.   MRN: [id number]  HPI: Dylan Chandler is a 54 y.o. male  Chief Complaint  Patient presents with  . Depression  . 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 He has been doing more exercise. He is feeling a lot better. He continues to do his dialysis.   DEPRESSION- feeling well on the trazodone. Seems to be a good replacement for the serzone. He tried to cut the 1/2 xanax out and had a big sense of impending doom. He continues to take 200mg  BID of his zoloft even though he is prescribed 200mg  daily Mood status: better Satisfied with current treatment?: yes Symptom severity: mild  Duration of current treatment : chronic Side effects: no Medication compliance: excellent compliance Psychotherapy/counseling: no  Previous psychiatric medications: zoloft, trazodone, xanax Depressed mood: yes Anxious mood: yes Anhedonia: no Significant weight loss or gain: no Insomnia: no  Fatigue: no Feelings of worthlessness or guilt: no Impaired concentration/indecisiveness: no Suicidal ideations: no Hopelessness: no Crying spells: no Depression screen [facility] 2/9 03/17/2020 10/31/2018 04/23/2018 10/12/2017 10/04/2017  Decreased Interest 0 - 0 0 0  Down, Depressed, Hopeless 0 - 0 0 0  PHQ - 2 Score 0 - 0 0 0  Altered sleeping 0 2 - - -  Tired, decreased energy 1 2 - - -  Change in appetite 1 1 - - -  Feeling bad or failure about yourself  0 0 - - -  Trouble concentrating 0 0 - - -  Moving slowly or fidgety/restless 0 0 - - -  Suicidal thoughts 0 - - - -  PHQ-9 Score 2 - - - -  Difficult doing  work/chores Not difficult at all - - - -   HYPERTENSION Hypertension status: slightly over treated- but asymptomatic  Satisfied with current treatment? yes Duration of hypertension: chronic BP monitoring frequency:  a few times a day BP medication side effects:  no Medication compliance: excellent compliance Previous BP meds: metoprolol, ramipril, torsemide Aspirin: no Recurrent headaches: no Visual changes: no Palpitations: no Dyspnea: no Chest pain: no Lower extremity edema: no Dizzy/lightheaded: no    Relevant past medical, surgical, family and social history reviewed and updated as indicated. Interim medical history since our last visit reviewed. Allergies and medications reviewed and updated.  Review of Systems  Constitutional: Negative.   Respiratory: Negative.   Cardiovascular: Negative.   Neurological: Negative.   Psychiatric/Behavioral: Negative.     Per HPI unless specifically indicated above     Objective:    BP (!) 94/53 (BP Location: Left Arm, Patient Position: Sitting, Cuff Size: Normal)   Pulse (!) 110   Temp 98.1 F (36.7 C) (Oral)   Wt 220 lb 9.6 oz (100.1 kg)   SpO2 97%   BMI 33.24 kg/m   Wt Readings from Last 3 Encounters:  03/17/20 220 lb 9.6 oz (100.1 kg)  12/13/19 228 lb 12.8 oz (103.8 kg)  10/16/19 233 lb 14.4 oz (106.1 kg)  Physical Exam Vitals and nursing note reviewed.  Constitutional:      General: He is not in acute distress.    Appearance: Normal appearance. He is not ill-appearing, toxic-appearing or diaphoretic.  HENT:     Head: Normocephalic and atraumatic.     Right Ear: External ear normal.     Left Ear: External ear normal.     Nose: Nose normal.     Mouth/Throat:     Mouth: Mucous membranes are moist.     Pharynx: Oropharynx is clear.  Eyes:     General: No scleral icterus.       Right eye: No discharge.        Left eye: No discharge.     Extraocular Movements: Extraocular movements intact.      Conjunctiva/sclera: Conjunctivae normal.     Pupils: Pupils are equal, round, and reactive to light.  Cardiovascular:     Rate and Rhythm: Normal rate and regular rhythm.     Pulses: Normal pulses.     Heart sounds: Normal heart sounds. No murmur. No friction rub. No gallop.   Pulmonary:     Effort: Pulmonary effort is normal. No respiratory distress.     Breath sounds: Normal breath sounds. No stridor. No wheezing, rhonchi or rales.  Chest:     Chest wall: No tenderness.  Musculoskeletal:        General: Normal range of motion.     Cervical back: Normal range of motion and neck supple.  Skin:    General: Skin is warm and dry.     Capillary Refill: Capillary refill takes less than 2 seconds.     Coloration: Skin is not jaundiced or pale.     Findings: No bruising, erythema, lesion or rash.  Neurological:     General: No focal deficit present.     Mental Status: He is alert and oriented to person, place, and time. Mental status is at baseline.  Psychiatric:        Mood and Affect: Mood normal.        Behavior: Behavior normal.        Thought Content: Thought content normal.        Judgment: Judgment normal.     Results for orders placed or performed in visit on 02/18/20  TSH  Result Value Ref Range   TSH 0.710 0.450 - 4.500 uIU/mL      Assessment & Plan:   Problem List Items Addressed This Visit      Cardiovascular and Mediastinum   Hypertension    BP running low. Asymptomatic. Continue current regimen. Refills given today. Call with any concerns.       Relevant Medications   torsemide (DEMADEX) 100 MG tablet   metoprolol tartrate (LOPRESSOR) 25 MG tablet   ramipril (ALTACE) 10 MG capsule   rosuvastatin (CRESTOR) 5 MG tablet     Endocrine   Hypogonadotropic hypogonadism 1 (HCC)   Relevant Medications   testosterone cypionate (DEPOTESTOSTERONE CYPIONATE) 200 MG/ML injection (Start on 06/24/2020)     Other   Hyperlipidemia   Relevant Medications   torsemide  (DEMADEX) 100 MG tablet   metoprolol tartrate (LOPRESSOR) 25 MG tablet   ramipril (ALTACE) 10 MG capsule   rosuvastatin (CRESTOR) 5 MG tablet   Depression, recurrent ([place]) - Primary    Under good control. Discussed again that I am not comfortable prescribing over the recommended dose of sertraline. He is tolerating the trazodone well. Will get him into psychiatry. New referral  generated today. Refills of xanax given today. Call with any concerns. Recheck 3 months. Continue to monitor.       Relevant Medications   ALPRAZolam (XANAX) 1 MG tablet   sertraline (ZOLOFT) 100 MG tablet   Other Relevant Orders   Ambulatory referral to Psychiatry    Other Visit Diagnoses    Benign hypertensive renal disease       Relevant Medications   ramipril (ALTACE) 10 MG capsule       Follow up plan: Return in about 3 months (around 06/15/2020).

## 2020-03-17 NOTE — Assessment & Plan Note (Signed)
BP running low. Asymptomatic. Continue current regimen. Refills given today. Call with any concerns.

## 2020-04-06 ENCOUNTER — Other Ambulatory Visit: Payer: Self-pay | Admitting: Family Medicine

## 2020-04-06 DIAGNOSIS — E230 Hypopituitarism: Secondary | ICD-10-CM

## 2020-04-06 NOTE — Telephone Encounter (Signed)
Routing to provider  

## 2020-04-06 NOTE — Telephone Encounter (Signed)
Requested medication (s) are due for refill today: yes  Requested medication (s) are on the active medication list: yes  Last refill:  03/17/20  Future visit scheduled: no  Notes to clinic:  Medication not assigned to a prorocol   Requested Prescriptions  Pending Prescriptions Disp Refills   testosterone cypionate (DEPOTESTOSTERONE CYPIONATE) 200 MG/ML injection [Pharmacy Med Name: TESTOSTERONE CYP 200 MG/ML] 2 mL 0    Sig: Inject 0.5 mLs (100 mg total) into the muscle every Wednesday.      Off-Protocol Failed - 04/06/2020 10:40 AM      Failed - Medication not assigned to a protocol, review manually.      Passed - Valid encounter within last 12 months    Recent Outpatient Visits           2 weeks ago Depression, recurrent ([place])   [place], Megan P, DO   3 months ago Routine general medical examination at a health care facility   [facility], Megan P, DO   9 months ago Depression, unspecified depression type   [place], Jeannette How, MD   10 months ago Mild episode of recurrent major depressive disorder [facility])   [facility] [place], Jeannette How, MD   1 year ago Encounter for screening for HIV   [facility] [place], Jeannette How, MD

## 2020-04-14 ENCOUNTER — Encounter: Payer: Self-pay | Admitting: Family Medicine

## 2020-05-08 ENCOUNTER — Other Ambulatory Visit: Payer: Self-pay | Admitting: Family Medicine

## 2020-05-08 ENCOUNTER — Encounter: Payer: Self-pay | Admitting: Family Medicine

## 2020-05-08 DIAGNOSIS — F339 Major depressive disorder, recurrent, unspecified: Secondary | ICD-10-CM

## 2020-05-08 NOTE — Telephone Encounter (Signed)
Requested medication (s) are due for refill today: Yes  Requested medication (s) are on the active medication list: Yes  Last refill:  03/19/20  Future visit scheduled: Yes  Notes to clinic:  Diagnosis code needed, patient takes 4 tabs/day instead of 2 as prescribed     Requested Prescriptions  Pending Prescriptions Disp Refills   sertraline (ZOLOFT) 100 MG tablet [Pharmacy Med Name: SERTRALINE HCL 100 MG TABLET] 180 tablet 0    Sig: TAKE 2 TABLETS BY MOUTH EVERY DAY      Psychiatry:  Antidepressants - SSRI Passed - 05/08/2020  5:19 PM      Passed - Completed PHQ-2 or PHQ-9 in the last 360 days.      Passed - Valid encounter within last 6 months    Recent Outpatient Visits           1 month ago Depression, recurrent ([place])   [place], Megan P, DO   4 months ago Routine general medical examination at a health care facility   [facility], [place] P, DO   10 months ago Depression, unspecified depression type   [place], Jeannette How, MD   11 months ago Mild episode of recurrent major depressive disorder [facility])   [facility] [place], Jeannette How, MD   1 year ago Encounter for screening for HIV   [facility] [place], Jeannette How, MD

## 2020-05-11 NOTE — Telephone Encounter (Signed)
It says the patient is on 400 mg a day, so it looks like the last refill would have been a 45 day supply

## 2020-05-11 NOTE — Telephone Encounter (Signed)
Unable to lvm. Sent mychart message for pt to come in to sign paper so we can get information from [employer].

## 2020-05-11 NOTE — Telephone Encounter (Signed)
I've told him I will not write him 400mg  without approval from psychiatry. I will only provide him with 200mg  daily- can we please try to get the notes from psychiatry ASAP ([facility])

## 2020-05-12 ENCOUNTER — Telehealth: Payer: Self-pay | Admitting: Family Medicine

## 2020-05-12 DIAGNOSIS — F339 Major depressive disorder, recurrent, unspecified: Secondary | ICD-10-CM

## 2020-05-12 NOTE — Telephone Encounter (Signed)
Please advise 

## 2020-05-12 NOTE — Telephone Encounter (Signed)
As I have told him before, that dose is double the recommended dose of that medication. I do not write that medication at that dose and I will not write it without authorization from a psychiatrist. I am waiting for the paperwork from [facility], but we have not gotten it from them. We are calling to request a copy of their note. Until that time, as I have told him before, I'm more than happy to continue to prescribe the 200mg  (max recommended dose) but I will not write 400mg  until I get authorization from a psychiatrist.

## 2020-05-12 NOTE — Telephone Encounter (Signed)
Called and left a message for the office manager, letting her know that we need the patients records.

## 2020-05-12 NOTE — Telephone Encounter (Signed)
Lvm for patient to come in our office to sign paper so we can request records from [facility] or He can go to [employer] and ask them to go ahead and send records to [place].

## 2020-05-12 NOTE — Telephone Encounter (Signed)
Copied from [place] [id number]. Topic: General - Other >> May 12, 2020 12:18 PM Rainey Pines A wrote: Patient is requesting a callback from Dr. Bard Herbert nurse in regards to clarification on why he is having to do so much to get his medication. Patient  stated that he is very frustrated and wants to know why he cannot get his medication after taking it for over 30 years and why he has to contact [facility] to release information that he has already given [facility] permission to release. Please advise

## 2020-05-13 MED ORDER — SERTRALINE HCL 100 MG PO TABS: 200.0000 mg | ORAL_TABLET | Freq: Two times a day (BID) | ORAL | 2 refills | Status: DC

## 2020-05-13 NOTE — Telephone Encounter (Signed)
Note placed in  your folder for review.

## 2020-05-25 ENCOUNTER — Other Ambulatory Visit: Payer: Self-pay | Admitting: Family Medicine

## 2020-05-25 DIAGNOSIS — F339 Major depressive disorder, recurrent, unspecified: Secondary | ICD-10-CM

## 2020-05-25 NOTE — Telephone Encounter (Signed)
Medication Refill - Medication: sertraline (ZOLOFT) 100 MG tablet (two twice a day, 4 altogether)  Pt takes 400 MG a day, has been on this medication since 1992. Almost out of his current supply.   Has the patient contacted their pharmacy? Yes.   (Agent: If no, request that the patient contact the pharmacy for the refill.) (Agent: If yes, when and what did the pharmacy advise?)  Preferred Pharmacy (with phone number or street name):  [id number] - [place], [place] [address]  [address] [place] [address]  Phone: [phone] Fax: [phone]     Agent: Please be advised that RX refills may take up to 3 business days. We ask that you follow-up with your pharmacy.

## 2020-06-15 ENCOUNTER — Other Ambulatory Visit: Payer: Self-pay | Admitting: Family Medicine

## 2020-06-15 ENCOUNTER — Telehealth: Payer: Self-pay | Admitting: Family Medicine

## 2020-06-15 DIAGNOSIS — E7800 Pure hypercholesterolemia, unspecified: Secondary | ICD-10-CM

## 2020-06-15 DIAGNOSIS — F339 Major depressive disorder, recurrent, unspecified: Secondary | ICD-10-CM

## 2020-06-15 DIAGNOSIS — I129 Hypertensive chronic kidney disease with stage 1 through stage 4 chronic kidney disease, or unspecified chronic kidney disease: Secondary | ICD-10-CM

## 2020-06-15 DIAGNOSIS — E079 Disorder of thyroid, unspecified: Secondary | ICD-10-CM

## 2020-06-15 MED ORDER — SERTRALINE HCL 100 MG PO TABS: 200.0000 mg | ORAL_TABLET | Freq: Two times a day (BID) | ORAL | 0 refills | Status: DC

## 2020-06-15 MED ORDER — RAMIPRIL 10 MG PO CAPS: 10.0000 mg | ORAL_CAPSULE | Freq: Every evening | ORAL | 0 refills | Status: DC

## 2020-06-15 MED ORDER — LIOTHYRONINE SODIUM 25 MCG PO TABS: 50.0000 ug | ORAL_TABLET | Freq: Every day | ORAL | 0 refills | Status: DC

## 2020-06-15 MED ORDER — METOPROLOL TARTRATE 25 MG PO TABS: 25.0000 mg | ORAL_TABLET | Freq: Two times a day (BID) | ORAL | 0 refills | Status: DC

## 2020-06-15 MED ORDER — TRAZODONE HCL 100 MG PO TABS
100.0000 mg | ORAL_TABLET | Freq: Two times a day (BID) | ORAL | 0 refills | Status: DC
Start: 2020-06-15 — End: 2020-07-07

## 2020-06-15 MED ORDER — ALPRAZOLAM 1 MG PO TABS: ORAL_TABLET | ORAL | 0 refills | Status: DC

## 2020-06-15 MED ORDER — ROSUVASTATIN CALCIUM 5 MG PO TABS: 5.0000 mg | ORAL_TABLET | ORAL | 0 refills | Status: DC

## 2020-06-15 NOTE — Telephone Encounter (Signed)
Called patient and LVM for him to return call to the office.

## 2020-06-15 NOTE — Telephone Encounter (Signed)
Pt called back stated that he did not want to run out of xanax medication and wanted to schedule with other provider due to PCP having openings after he would run out of Xanax medication. Pt stated he did not want to run out of medication.Pt has apt on 06/19/20. Pt verbalized understanding.

## 2020-06-15 NOTE — Telephone Encounter (Signed)
Pt went on a trip and left all his medication on the top of the car in a bag. Pt is completely out of all medication. Can we send all medication to:   [id number] - [place], [place] - [address] Phone:  [phone]  Fax:  [phone]

## 2020-06-15 NOTE — Telephone Encounter (Signed)
Will refill everything x 30 days in Dr. Durenda Age absence other than the xanax which I will give 2 week supply - he was due this week for 3 month f/u which he will need to get scheduled asap in order to receive more refills of a controlled substance.   Please get him scheduled with Dr. Wynetta Emery as soon as able for this 3 month f/u

## 2020-06-19 ENCOUNTER — Encounter: Payer: Self-pay | Admitting: Nurse Practitioner

## 2020-06-19 ENCOUNTER — Ambulatory Visit: Payer: 59 | Admitting: Family Medicine

## 2020-06-19 ENCOUNTER — Ambulatory Visit (INDEPENDENT_AMBULATORY_CARE_PROVIDER_SITE_OTHER): Payer: 59 | Admitting: Nurse Practitioner

## 2020-06-19 ENCOUNTER — Other Ambulatory Visit: Payer: Self-pay

## 2020-06-19 VITALS — BP 112/72 | HR 74 | Temp 98.5°F | Wt 217.0 lb

## 2020-06-19 DIAGNOSIS — F339 Major depressive disorder, recurrent, unspecified: Secondary | ICD-10-CM

## 2020-06-19 DIAGNOSIS — R3915 Urgency of urination: Secondary | ICD-10-CM | POA: Insufficient documentation

## 2020-06-19 DIAGNOSIS — F419 Anxiety disorder, unspecified: Secondary | ICD-10-CM | POA: Diagnosis not present

## 2020-06-19 LAB — UA/M W/RFLX CULTURE, ROUTINE
Bilirubin, UA: NEGATIVE
Ketones, UA: NEGATIVE
Leukocytes,UA: NEGATIVE
Nitrite, UA: NEGATIVE
Specific Gravity, UA: 1.01 (ref 1.005–1.030)
Urobilinogen, Ur: 0.2 mg/dL (ref 0.2–1.0)
pH, UA: 5 (ref 5.0–7.5)

## 2020-06-19 LAB — MICROSCOPIC EXAMINATION: Bacteria, UA: NONE SEEN

## 2020-06-19 MED ORDER — ALPRAZOLAM 1 MG PO TABS: ORAL_TABLET | ORAL | 0 refills | Status: DC

## 2020-06-19 NOTE — Assessment & Plan Note (Signed)
Chronic, ongoing.  Will send in refill of alprazolam to bridge until appointment with PCP on 8/18.  PDMP reviewed - not appropriate due to patient losing all of medications on top of car while in [place].  Will send refill this time and address all medications with PCP at upcoming appointment.

## 2020-06-19 NOTE — Progress Notes (Signed)
BP 112/72 (BP Location: Left Arm, Patient Position: Sitting, Cuff Size: Normal)   Pulse 74   Temp 98.5 F (36.9 C) (Oral)   Wt 217 lb (98.4 kg)   SpO2 97%   BMI 32.70 kg/m    Subjective:    Patient ID: Dylan Chandler, male    DOB: 1965/11/26, 54 y.o.   MRN: [id number]  HPI: Dylan Chandler is a 54 y.o. male presenting for follow up.  Would like a refill of his anxiety medication as he does not want to run out and is unsure when he can get in with PCP.  Chief Complaint  Patient presents with  . Medication Refill  . Follow-up    3 month   ANXIETY Patient reports that all of medication fell off of roof.  Reports that he has been on antidepressant medication since the 1990s and has been doing pretty well. Duration:controlled Anxious mood: yes  Excessive worrying: yes Irritability: no  Sweating: no Nausea: no Palpitations:no Hyperventilation: no Panic attacks: no Agoraphobia: no  Obscessions/compulsions: no Depressed mood: no Depression screen [facility] 2/9 06/19/2020 03/17/2020 10/31/2018 04/23/2018 10/12/2017  Decreased Interest 0 0 - 0 0  Down, Depressed, Hopeless 0 0 - 0 0  PHQ - 2 Score 0 0 - 0 0  Altered sleeping 0 0 2 - -  Tired, decreased energy 1 1 2  - -  Change in appetite 0 1 1 - -  Feeling bad or failure about yourself  0 0 0 - -  Trouble concentrating 0 0 0 - -  Moving slowly or fidgety/restless 0 0 0 - -  Suicidal thoughts 0 0 - - -  PHQ-9 Score 1 2 - - -  Difficult doing work/chores - Not difficult at all - - -   GAD 7 : Generalized Anxiety Score 06/19/2020 03/17/2020  Nervous, Anxious, on Edge 1 0  Control/stop worrying 1 0  Worry too much - different things 1 0  Trouble relaxing 0 0  Restless 0 0  Easily annoyed or irritable 1 0  Afraid - awful might happen 1 0  Total GAD 7 Score 5 0  Anxiety Difficulty Not difficult at all -   Anhedonia: no Weight changes: no Insomnia: no   Hypersomnia: no Fatigue/loss of energy: yes Feelings of worthlessness:  no Feelings of guilt: no Impaired concentration/indecisiveness: yes Suicidal ideations: no  Crying spells: no Recent Stressors/Life Changes: no   Relationship problems: no   Family stress: no     Financial stress: no    Job stress: no    Recent death/loss: no  URINARY SYMPTOMS Dysuria: no Urinary frequency: yes Urgency: yes Small volume voids: yes Symptom severity: mild Urinary incontinence: no Foul odor: no Hematuria: no Abdominal pain: no Back pain: no Suprapubic pain/pressure: no Flank pain: no Fever:  No Nausea: no Vomiting: no Relief with cranberry juice: not tried Relief with pyridium: not tried Status: worse Previous urinary tract infection: yes Treatments attempted: nothing tried   Allergies  Allergen Reactions  . Amlodipine Swelling    Other reaction(s): Unknown  . Metoprolol Tartrate Other (See Comments)    fatague  . Renvela [Sevelamer] Nausea Only    Extreme constipation   Outpatient Encounter Medications as of 06/19/2020  Medication Sig Note  . [START ON 06/26/2020] ALPRAZolam (XANAX) 1 MG tablet TAKE 1 TABLET BY MOUTH IN THE MORNING, 1/2 TABLET AT 4PM AND 1 TABLET AT BEDTIME   . aspirin EC 81 MG tablet Take 243 mg by  mouth every evening.    . calcitRIOL (ROCALTROL) 0.25 MCG capsule Take 0.5 mcg by mouth every evening.   . clopidogrel (PLAVIX) 75 MG tablet Take 75 mg by mouth daily.   Marland Kitchen ibuprofen (ADVIL) 200 MG tablet Take 800 mg by mouth 2 (two) times daily as needed for headache or moderate pain.   Marland Kitchen liothyronine (CYTOMEL) 25 MCG tablet Take 2 tablets (50 mcg total) by mouth daily.   . metoprolol succinate (TOPROL-XL) 50 MG 24 hr tablet Take 50 mg by mouth 2 (two) times daily.   . ramipril (ALTACE) 10 MG capsule Take 1 capsule (10 mg total) by mouth every evening.   . rosuvastatin (CRESTOR) 5 MG tablet Take 1 tablet (5 mg total) by mouth every other day.   . sertraline (ZOLOFT) 100 MG tablet Take 2 tablets (200 mg total) by mouth in the morning and at  bedtime.   . sildenafil (VIAGRA) 100 MG tablet Take 20 mg by mouth 2 (two) times daily.    . sodium bicarbonate 650 MG tablet Take 1,300 mg by mouth 3 (three) times daily.   Marland Kitchen testosterone cypionate (DEPOTESTOSTERONE CYPIONATE) 200 MG/ML injection INJECT 0.5 MLS (100 MG TOTAL) INTO THE MUSCLE EVERY WEDNESDAY.   Marland Kitchen torsemide (DEMADEX) 100 MG tablet Take 100 mg by mouth daily.   . traZODone (DESYREL) 100 MG tablet Take 1 tablet (100 mg total) by mouth 2 (two) times daily.   . [DISCONTINUED] ALPRAZolam (XANAX) 1 MG tablet TAKE 1 TABLET BY MOUTH IN THE MORNING, 1/2 TABLET AT 4PM AND 1 TABLET AT BEDTIME   . nitroGLYCERIN (NITROSTAT) 0.4 MG SL tablet PLACE ONE TABLET UNDER THE TONGUE AS NEEDED FOR CHEST PAIN (Patient not taking: Reported on 03/17/2020) 12/13/2019: Pt wants refill  . Zinc 50 MG CAPS Take 50 mg by mouth every evening. (Patient not taking: Reported on 06/19/2020)   . [DISCONTINUED] metoprolol tartrate (LOPRESSOR) 25 MG tablet Take 1 tablet (25 mg total) by mouth 2 (two) times daily.    No facility-administered encounter medications on file as of 06/19/2020.   Patient Active Problem List   Diagnosis Date Noted  . Urinary urgency 06/19/2020  . Dialysis patient ([place]) 07/03/2019  . Anxiety 10/31/2018  . Secondary hyperparathyroidism of renal origin ([place]) 03/15/2017  . Acute ST elevation myocardial infarction (STEMI) involving left anterior descending (LAD) coronary artery ([place])   . CKD stage 5 secondary to hypertension ([place]) 07/13/2015  . 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 Sleep apnea 07/13/2015  . Elevated uric acid in blood 07/13/2015  . Hyperlipidemia   . Hypertension   . Depression, recurrent ([place])   . Thyroid disease    Past Medical History:  Diagnosis Date  . Anxiety   . CAD (coronary artery disease)   . Cardiac murmur   . Chronic kidney disease   . Depression   . Elevated SGOT   . Hyperlipidemia   . Hypertension   . 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  Hypothyroidism   . Myocardial infarction ([place]) 2008, 2017  . Sleep apnea   . Thyroid disease    Relevant past medical, surgical, family and social history reviewed and updated as indicated. Interim medical history since our last visit reviewed.  Review of Systems  Constitutional: Negative.  Negative for activity change, appetite change and fever.  Skin: Negative.   Neurological: Negative.   Psychiatric/Behavioral: Negative for agitation, confusion, decreased concentration and sleep disturbance. The patient is nervous/anxious.     Per HPI unless specifically  indicated above     Objective:    BP 112/72 (BP Location: Left Arm, Patient Position: Sitting, Cuff Size: Normal)   Pulse 74   Temp 98.5 F (36.9 C) (Oral)   Wt 217 lb (98.4 kg)   SpO2 97%   BMI 32.70 kg/m   Wt Readings from Last 3 Encounters:  06/19/20 217 lb (98.4 kg)  03/17/20 220 lb 9.6 oz (100.1 kg)  12/13/19 228 lb 12.8 oz (103.8 kg)    Physical Exam Vitals and nursing note reviewed.  Constitutional:      General: He is not in acute distress.    Appearance: Normal appearance.  Skin:    General: Skin is warm and dry.     Coloration: Skin is not jaundiced or pale.  Neurological:     General: No focal deficit present.     Mental Status: He is alert and oriented to person, place, and time.     Motor: No weakness.     Gait: Gait normal.  Psychiatric:        Mood and Affect: Mood normal.        Behavior: Behavior normal.        Thought Content: Thought content normal.        Judgment: Judgment normal.     Results for orders placed or performed in visit on 06/19/20  Microscopic Examination   Urine  Result Value Ref Range   WBC, UA 0-5 0 - 5 /hpf   RBC 3-10 (A) 0 - 2 /hpf   Epithelial Cells (non renal) 0-10 0 - 10 /hpf   Bacteria, UA None seen None seen/Few  UA/M w/rflx Culture, Routine   Specimen: Urine   Urine  Result Value Ref Range   Specific Gravity, UA 1.010 1.005 - 1.030   pH, UA 5.0 5.0 - 7.5    Color, UA Yellow Yellow   Appearance Ur Clear Clear   Leukocytes,UA Negative Negative   Protein,UA 2+ (A) Negative/Trace   Glucose, UA Trace (A) Negative   Ketones, UA Negative Negative   RBC, UA 1+ (A) Negative   Bilirubin, UA Negative Negative   Urobilinogen, Ur 0.2 0.2 - 1.0 mg/dL   Nitrite, UA Negative Negative   Microscopic Examination See below:       Assessment & Plan:   Problem List Items Addressed This Visit      Other   Depression, recurrent ([place]) - Primary   Relevant Medications   ALPRAZolam (XANAX) 1 MG tablet (Start on 06/26/2020)   Anxiety    Chronic, ongoing.  Will send in refill of alprazolam to bridge until appointment with PCP on 8/18.  PDMP reviewed - not appropriate due to patient losing all of medications on top of car while in [place].  Will send refill this time and address all medications with PCP at upcoming appointment.      Relevant Medications   ALPRAZolam (XANAX) 1 MG tablet (Start on 06/26/2020)   Urinary urgency    Acute, ongoing.  Patient reports father had history of BPH and patient with increased urgency - will check UA with reflex culture and treat as indicated.      Relevant Orders   UA/M w/rflx Culture, Routine (Completed)       Follow up plan: Return for f/u with PCP ASAP.

## 2020-06-19 NOTE — Assessment & Plan Note (Signed)
Acute, ongoing.  Patient reports father had history of BPH and patient with increased urgency - will check UA with reflex culture and treat as indicated.

## 2020-06-20 ENCOUNTER — Telehealth: Payer: Self-pay | Admitting: Family Medicine

## 2020-06-20 ENCOUNTER — Other Ambulatory Visit: Payer: Self-pay | Admitting: Family Medicine

## 2020-06-20 MED ORDER — FINASTERIDE 5 MG PO TABS
5.0000 mg | ORAL_TABLET | Freq: Every day | ORAL | 0 refills | Status: DC
Start: 2020-06-20 — End: 2020-07-01

## 2020-06-20 MED ORDER — CIPROFLOXACIN HCL 500 MG PO TABS: 500.0000 mg | ORAL_TABLET | Freq: Two times a day (BID) | ORAL | 0 refills | Status: DC

## 2020-06-20 NOTE — Telephone Encounter (Signed)
Seen yesterday for urinary symptoms. Significantly worse with urgency, chills and dysuria. Urine culture not back yet. Rx for proscar and cipro called in. Let us know if not better by Monday.

## 2020-07-01 ENCOUNTER — Encounter: Payer: Self-pay | Admitting: Family Medicine

## 2020-07-01 ENCOUNTER — Other Ambulatory Visit: Payer: Self-pay

## 2020-07-01 ENCOUNTER — Telehealth: Payer: Self-pay | Admitting: Family Medicine

## 2020-07-01 ENCOUNTER — Ambulatory Visit (INDEPENDENT_AMBULATORY_CARE_PROVIDER_SITE_OTHER): Payer: 59 | Admitting: Family Medicine

## 2020-07-01 VITALS — BP 106/69 | HR 85 | Temp 98.0°F | Wt 218.6 lb

## 2020-07-01 DIAGNOSIS — Z23 Encounter for immunization: Secondary | ICD-10-CM

## 2020-07-01 DIAGNOSIS — E7800 Pure hypercholesterolemia, unspecified: Secondary | ICD-10-CM

## 2020-07-01 DIAGNOSIS — F339 Major depressive disorder, recurrent, unspecified: Secondary | ICD-10-CM

## 2020-07-01 DIAGNOSIS — N185 Chronic kidney disease, stage 5: Secondary | ICD-10-CM

## 2020-07-01 DIAGNOSIS — E079 Disorder of thyroid, unspecified: Secondary | ICD-10-CM

## 2020-07-01 DIAGNOSIS — I129 Hypertensive chronic kidney disease with stage 1 through stage 4 chronic kidney disease, or unspecified chronic kidney disease: Secondary | ICD-10-CM | POA: Diagnosis not present

## 2020-07-01 DIAGNOSIS — Z87438 Personal history of other diseases of male genital organs: Secondary | ICD-10-CM

## 2020-07-01 DIAGNOSIS — Z1159 Encounter for screening for other viral diseases: Secondary | ICD-10-CM

## 2020-07-01 DIAGNOSIS — I120 Hypertensive chronic kidney disease with stage 5 chronic kidney disease or end stage renal disease: Secondary | ICD-10-CM

## 2020-07-01 LAB — URINALYSIS, ROUTINE W REFLEX MICROSCOPIC
Bilirubin, UA: NEGATIVE
Glucose, UA: NEGATIVE
Ketones, UA: NEGATIVE
Leukocytes,UA: NEGATIVE
Nitrite, UA: NEGATIVE
Specific Gravity, UA: 1.01 (ref 1.005–1.030)
Urobilinogen, Ur: 0.2 mg/dL (ref 0.2–1.0)
pH, UA: 5 (ref 5.0–7.5)

## 2020-07-01 LAB — MICROSCOPIC EXAMINATION
Bacteria, UA: NONE SEEN
WBC, UA: NONE SEEN /hpf (ref 0–5)

## 2020-07-01 MED ORDER — ROSUVASTATIN CALCIUM 5 MG PO TABS: 5.0000 mg | ORAL_TABLET | ORAL | 1 refills | Status: AC

## 2020-07-01 MED ORDER — CLOPIDOGREL BISULFATE 75 MG PO TABS: 75.0000 mg | ORAL_TABLET | Freq: Every day | ORAL | 0 refills | Status: DC

## 2020-07-01 MED ORDER — METOPROLOL SUCCINATE ER 50 MG PO TB24: 50.0000 mg | ORAL_TABLET | Freq: Two times a day (BID) | ORAL | 1 refills | Status: DC

## 2020-07-01 MED ORDER — FINASTERIDE 5 MG PO TABS: 5.0000 mg | ORAL_TABLET | Freq: Every day | ORAL | 1 refills | Status: DC

## 2020-07-01 MED ORDER — ALPRAZOLAM 1 MG PO TABS
ORAL_TABLET | ORAL | 0 refills | Status: DC
Start: 2020-09-05 — End: 2020-10-06

## 2020-07-01 MED ORDER — ALPRAZOLAM 1 MG PO TABS: ORAL_TABLET | ORAL | 0 refills | Status: AC

## 2020-07-01 MED ORDER — SERTRALINE HCL 100 MG PO TABS: 200.0000 mg | ORAL_TABLET | Freq: Two times a day (BID) | ORAL | 1 refills | Status: DC

## 2020-07-01 MED ORDER — LIOTHYRONINE SODIUM 25 MCG PO TABS: 50.0000 ug | ORAL_TABLET | Freq: Every day | ORAL | 1 refills | Status: DC

## 2020-07-01 MED ORDER — RAMIPRIL 10 MG PO CAPS: 10.0000 mg | ORAL_CAPSULE | Freq: Every evening | ORAL | 1 refills | Status: DC

## 2020-07-01 NOTE — Progress Notes (Signed)
BP 106/69 (BP Location: Left Arm, Patient Position: Sitting, Cuff Size: Large)   Pulse 85   Temp 98 F (36.7 C) (Oral)   Wt 218 lb 9.6 oz (99.2 kg)   SpO2 99%   BMI 32.94 kg/m    Subjective:    Patient ID: Dylan Chandler, male    DOB: 1966-05-13, 54 y.o.   MRN: [id number]  HPI: Dylan Chandler is a 54 y.o. male  Chief Complaint  Patient presents with  . Depression   DEPRESSION Mood status: stable Satisfied with current treatment?: yes Symptom severity: mild  Duration of current treatment : chronic Side effects: no Medication compliance: excellent compliance Psychotherapy/counseling: no  Depressed mood: no Anxious mood: no Anhedonia: no Significant weight loss or gain: no Insomnia: no  Fatigue: yes Feelings of worthlessness or guilt: no Impaired concentration/indecisiveness: no Suicidal ideations: no Hopelessness: no Crying spells: no Depression screen [facility] 2/9 06/19/2020 03/17/2020 10/31/2018 04/23/2018 10/12/2017  Decreased Interest 0 0 - 0 0  Down, Depressed, Hopeless 0 0 - 0 0  PHQ - 2 Score 0 0 - 0 0  Altered sleeping 0 0 2 - -  Tired, decreased energy 1 1 2  - -  Change in appetite 0 1 1 - -  Feeling bad or failure about yourself  0 0 0 - -  Trouble concentrating 0 0 0 - -  Moving slowly or fidgety/restless 0 0 0 - -  Suicidal thoughts 0 0 - - -  PHQ-9 Score 1 2 - - -  Difficult doing work/chores - Not difficult at all - - -    Relevant past medical, surgical, family and social history reviewed and updated as indicated. Interim medical history since our last visit reviewed. Allergies and medications reviewed and updated.  Review of Systems  Constitutional: Negative.   Respiratory: Negative.   Cardiovascular: Negative.   Gastrointestinal: Negative.   Musculoskeletal: Negative.   Neurological: Negative.   Psychiatric/Behavioral: Negative.     Per HPI unless specifically indicated above     Objective:    BP 106/69 (BP Location: Left Arm,  Patient Position: Sitting, Cuff Size: Large)   Pulse 85   Temp 98 F (36.7 C) (Oral)   Wt 218 lb 9.6 oz (99.2 kg)   SpO2 99%   BMI 32.94 kg/m   Wt Readings from Last 3 Encounters:  07/01/20 218 lb 9.6 oz (99.2 kg)  06/19/20 217 lb (98.4 kg)  03/17/20 220 lb 9.6 oz (100.1 kg)    Physical Exam Vitals and nursing note reviewed.  Constitutional:      General: He is not in acute distress.    Appearance: Normal appearance. He is not ill-appearing, toxic-appearing or diaphoretic.  HENT:     Head: Normocephalic and atraumatic.     Right Ear: External ear normal.     Left Ear: External ear normal.     Nose: Nose normal.     Mouth/Throat:     Mouth: Mucous membranes are moist.     Pharynx: Oropharynx is clear.  Eyes:     General: No scleral icterus.       Right eye: No discharge.        Left eye: No discharge.     Extraocular Movements: Extraocular movements intact.     Conjunctiva/sclera: Conjunctivae normal.     Pupils: Pupils are equal, round, and reactive to light.  Cardiovascular:     Rate and Rhythm: Normal rate and regular rhythm.     Pulses:  Normal pulses.     Heart sounds: Normal heart sounds. No murmur heard.  No friction rub. No gallop.   Pulmonary:     Effort: Pulmonary effort is normal. No respiratory distress.     Breath sounds: Normal breath sounds. No stridor. No wheezing, rhonchi or rales.  Chest:     Chest wall: No tenderness.  Musculoskeletal:        General: Normal range of motion.     Cervical back: Normal range of motion and neck supple.  Skin:    General: Skin is warm and dry.     Capillary Refill: Capillary refill takes less than 2 seconds.     Coloration: Skin is not jaundiced or pale.     Findings: No bruising, erythema, lesion or rash.  Neurological:     General: No focal deficit present.     Mental Status: He is alert and oriented to person, place, and time. Mental status is at baseline.  Psychiatric:        Mood and Affect: Mood normal.         Behavior: Behavior normal.        Thought Content: Thought content normal.        Judgment: Judgment normal.     Results for orders placed or performed in visit on 07/01/20  Microscopic Examination   Urine  Result Value Ref Range   WBC, UA None seen 0 - 5 /hpf   RBC 0-2 0 - 2 /hpf   Epithelial Cells (non renal) 0-10 0 - 10 /hpf   Bacteria, UA None seen None seen/Few  Hepatitis C Antibody  Result Value Ref Range   Hep C Virus Ab 0.2 0.0 - 0.9 s/co ratio  CBC with Differential/Platelet  Result Value Ref Range   WBC 8.0 3.4 - 10.8 x10E3/uL   RBC 3.85 (L) 4.14 - 5.80 x10E6/uL   Hemoglobin 11.4 (L) 13.0 - 17.7 g/dL   Hematocrit 35.0 (L) 37.5 - 51.0 %   MCV 91 79 - 97 fL   MCH 29.6 26.6 - 33.0 pg   MCHC 32.6 31 - 35 g/dL   RDW 15.5 (H) 11.6 - 15.4 %   Platelets 167 150 - 450 x10E3/uL   Neutrophils 73 Not Estab. %   Lymphs 9 Not Estab. %   Monocytes 13 Not Estab. %   Eos 4 Not Estab. %   Basos 0 Not Estab. %   Neutrophils Absolute 5.8 1 - 7 x10E3/uL   Lymphocytes Absolute 0.7 0 - 3 x10E3/uL   Monocytes Absolute 1.1 (H) 0 - 0 x10E3/uL   EOS (ABSOLUTE) 0.3 0.0 - 0.4 x10E3/uL   Basophils Absolute 0.0 0 - 0 x10E3/uL   Immature Granulocytes 1 Not Estab. %   Immature Grans (Abs) 0.1 0.0 - 0.1 x10E3/uL  Comprehensive metabolic panel  Result Value Ref Range   Glucose 74 65 - 99 mg/dL   BUN 122 (HH) 6 - 24 mg/dL   Creatinine, Ser 9.03 (H) 0.76 - 1.27 mg/dL   GFR calc non Af Amer 6 (L) >59 mL/min/1.73   GFR calc Af Amer 7 (L) >59 mL/min/1.73   BUN/Creatinine Ratio 14 9 - 20   Sodium 138 134 - 144 mmol/L   Potassium 6.8 (HH) 3.5 - 5.2 mmol/L   Chloride 106 96 - 106 mmol/L   CO2 14 (L) 20 - 29 mmol/L   Calcium 8.1 (L) 8.7 - 10.2 mg/dL   Total Protein 6.1 6.0 - 8.5 g/dL   Albumin  3.6 (L) 3.8 - 4.9 g/dL   Globulin, Total 2.5 1.5 - 4.5 g/dL   Albumin/Globulin Ratio 1.4 1.2 - 2.2   Bilirubin Total 0.3 0.0 - 1.2 mg/dL   Alkaline Phosphatase 95 48 - 121 IU/L   AST 26 0 - 40 IU/L    ALT 25 0 - 44 IU/L  Lipid Panel w/o Chol/HDL Ratio  Result Value Ref Range   Cholesterol, Total 104 100 - 199 mg/dL   Triglycerides 62 0 - 149 mg/dL   HDL 34 (L) >39 mg/dL   VLDL Cholesterol Cal 14 5 - 40 mg/dL   LDL Chol Calc (NIH) 56 0 - 99 mg/dL  TSH  Result Value Ref Range   TSH 1.290 0.450 - 4.500 uIU/mL  Urinalysis, Routine w reflex microscopic  Result Value Ref Range   Specific Gravity, UA 1.010 1.005 - 1.030   pH, UA 5.0 5.0 - 7.5   Color, UA Yellow Yellow   Appearance Ur Clear Clear   Leukocytes,UA Negative Negative   Protein,UA 2+ (A) Negative/Trace   Glucose, UA Negative Negative   Ketones, UA Negative Negative   RBC, UA 1+ (A) Negative   Bilirubin, UA Negative Negative   Urobilinogen, Ur 0.2 0.2 - 1.0 mg/dL   Nitrite, UA Negative Negative   Microscopic Examination See below:   PSA  Result Value Ref Range   Prostate Specific Ag, Serum 0.5 0.0 - 4.0 ng/mL      Assessment & Plan:   Problem List Items Addressed This Visit      Endocrine   Thyroid disease    Rechecking labs today. Await results. Treat as needed.       Relevant Medications   liothyronine (CYTOMEL) 25 MCG tablet   metoprolol succinate (TOPROL-XL) 50 MG 24 hr tablet   Other Relevant Orders   TSH (Completed)     Genitourinary   CKD stage 5 secondary to hypertension ([place])    Checking labs today. Await results. Continue to follow with nephrology.      Benign hypertensive renal disease    Under good control on current regimen. Continue current regimen. Continue to monitor. Call with any concerns. Refills given. Labs drawn today.        Relevant Medications   ramipril (ALTACE) 10 MG capsule   Other Relevant Orders   CBC with Differential/Platelet (Completed)   Comprehensive metabolic panel (Completed)     Other   Hyperlipidemia    Under good control on current regimen. Continue current regimen. Continue to monitor. Call with any concerns. Refills given. Labs drawn today.        Relevant Medications   rosuvastatin (CRESTOR) 5 MG tablet   ramipril (ALTACE) 10 MG capsule   metoprolol succinate (TOPROL-XL) 50 MG 24 hr tablet   Other Relevant Orders   CBC with Differential/Platelet (Completed)   Comprehensive metabolic panel (Completed)   Lipid Panel w/o Chol/HDL Ratio (Completed)   Depression, recurrent ([facility]) - Primary    Dose OK'd by psychiatry. Under good control on current regimen. Continue current regimen. Continue to monitor. Call with any concerns. Refills given.        Relevant Medications   sertraline (ZOLOFT) 100 MG tablet   ALPRAZolam (XANAX) 1 MG tablet (Start on 07/07/2020)   ALPRAZolam (XANAX) 1 MG tablet (Start on 08/06/2020)   ALPRAZolam Duanne Moron) 1 MG tablet (Start on 09/05/2020)   Other Relevant Orders   CBC with Differential/Platelet (Completed)    Other Visit Diagnoses    Encounter for hepatitis  C screening test for low risk patient       Labs drawn today. Await results.    Relevant Orders   Hepatitis C Antibody (Completed)   History of prostatitis       Labs drawn today. Await results.    Relevant Orders   Urinalysis, Routine w reflex microscopic (Completed)   PSA (Completed)       Follow up plan: Return for Before 10/05/20 for follow up.

## 2020-07-01 NOTE — Telephone Encounter (Signed)
Called by [employer]. Has not been to dialysis in about 2 months and has not been answering phone calls. We did get labs today. Will await results and discuss when I call him with labs.

## 2020-07-02 ENCOUNTER — Telehealth: Payer: Self-pay

## 2020-07-02 ENCOUNTER — Other Ambulatory Visit: Payer: Self-pay

## 2020-07-02 LAB — PSA: Prostate Specific Ag, Serum: 0.5 ng/mL (ref 0.0–4.0)

## 2020-07-02 LAB — CBC WITH DIFFERENTIAL/PLATELET
Basophils Absolute: 0 10*3/uL (ref 0.0–0.2)
Basos: 0 %
EOS (ABSOLUTE): 0.3 10*3/uL (ref 0.0–0.4)
Eos: 4 %
Hematocrit: 35 % — ABNORMAL LOW (ref 37.5–51.0)
Hemoglobin: 11.4 g/dL — ABNORMAL LOW (ref 13.0–17.7)
Immature Grans (Abs): 0.1 10*3/uL (ref 0.0–0.1)
Immature Granulocytes: 1 %
Lymphocytes Absolute: 0.7 10*3/uL (ref 0.7–3.1)
Lymphs: 9 %
MCH: 29.6 pg (ref 26.6–33.0)
MCHC: 32.6 g/dL (ref 31.5–35.7)
MCV: 91 fL (ref 79–97)
Monocytes Absolute: 1.1 10*3/uL — ABNORMAL HIGH (ref 0.1–0.9)
Monocytes: 13 %
Neutrophils Absolute: 5.8 10*3/uL (ref 1.4–7.0)
Neutrophils: 73 %
Platelets: 167 10*3/uL (ref 150–450)
RBC: 3.85 x10E6/uL — ABNORMAL LOW (ref 4.14–5.80)
RDW: 15.5 % — ABNORMAL HIGH (ref 11.6–15.4)
WBC: 8 10*3/uL (ref 3.4–10.8)

## 2020-07-02 LAB — COMPREHENSIVE METABOLIC PANEL
ALT: 25 IU/L (ref 0–44)
AST: 26 IU/L (ref 0–40)
Albumin/Globulin Ratio: 1.4 (ref 1.2–2.2)
Albumin: 3.6 g/dL — ABNORMAL LOW (ref 3.8–4.9)
Alkaline Phosphatase: 95 IU/L (ref 48–121)
BUN/Creatinine Ratio: 14 (ref 9–20)
BUN: 122 mg/dL (ref 6–24)
Bilirubin Total: 0.3 mg/dL (ref 0.0–1.2)
CO2: 14 mmol/L — ABNORMAL LOW (ref 20–29)
Calcium: 8.1 mg/dL — ABNORMAL LOW (ref 8.7–10.2)
Chloride: 106 mmol/L (ref 96–106)
Creatinine, Ser: 9.03 mg/dL — ABNORMAL HIGH (ref 0.76–1.27)
GFR calc Af Amer: 7 mL/min/{1.73_m2} — ABNORMAL LOW (ref >59)
GFR calc non Af Amer: 6 mL/min/{1.73_m2} — ABNORMAL LOW (ref >59)
Globulin, Total: 2.5 g/dL (ref 1.5–4.5)
Glucose: 74 mg/dL (ref 65–99)
Potassium: 6.8 mmol/L (ref 3.5–5.2)
Sodium: 138 mmol/L (ref 134–144)
Total Protein: 6.1 g/dL (ref 6.0–8.5)

## 2020-07-02 LAB — LIPID PANEL W/O CHOL/HDL RATIO
Cholesterol, Total: 104 mg/dL (ref 100–199)
HDL: 34 mg/dL — ABNORMAL LOW (ref >39)
LDL Chol Calc (NIH): 56 mg/dL (ref 0–99)
Triglycerides: 62 mg/dL (ref 0–149)
VLDL Cholesterol Cal: 14 mg/dL (ref 5–40)

## 2020-07-02 LAB — HEPATITIS C ANTIBODY: Hep C Virus Ab: 0.2 s/co ratio (ref 0.0–0.9)

## 2020-07-02 LAB — TSH: TSH: 1.29 u[IU]/mL (ref 0.450–4.500)

## 2020-07-02 NOTE — Progress Notes (Signed)
Called patient at 9:57 with results of his labs. LMOM for him to call back. Potassium is 6.8 and his kidneys are not doing well. Called yesterday by nephrology that he has not been to dialysis in about 2 months. Based on his blood work, he really needs to go back- can call his nephrologist to see if they can do it outpatient, otherwise with potassium so high, I would advise him to go immediately to the ER. The rest of his blood work looked normal. Please tell him this when he calls back.

## 2020-07-02 NOTE — Telephone Encounter (Signed)
[facility] called to inform us of a critical lab result for patient. Potassium at 6.8. Routing to provider to advise.

## 2020-07-02 NOTE — Telephone Encounter (Signed)
Result note in. Please let me know if you hear from him.

## 2020-07-02 NOTE — Telephone Encounter (Signed)
Called patient. He states that he no longer plans to go back to dialysis as it didn't fix his kidney function and it made him feel bad. He stated that he had not taken his sodium bicarb appropriately and wondered if that made his potassium go up. Wanted to manage it outpatient. I explained to him that it is not safe to manage his potassium of 6.8 as an outpatient and advised him to go immediately to the ER for evaluation. It [place] unclear if he will go. He said if he goes to the ER he will go to [facility].   We will call to give report on him to [facility] ER.   Discussed with patient that dialysis does not fix his kidneys, but that it filters his blood and keeps him alive, but it will not cause his GFR to improve. Discussed the risks of not going to the ER including risk of sudden death. Advised him that I could not give recommendations on managing this outpatient as it was not safe and again advised him to go immediately to the ER.

## 2020-07-02 NOTE — Telephone Encounter (Signed)
[facility] ER alerted.

## 2020-07-02 NOTE — Telephone Encounter (Signed)
See result note. Called and LVM asking for patient to please call us back ASAP for results.

## 2020-07-02 NOTE — Telephone Encounter (Signed)
-----   Message from Dellia Cloud, [place] sent at 07/02/2020  3:26 PM EDT ----- Spoke with pt and told him what Dr. Wynetta Emery said. Pt stated he does not wish to go back to nephrology as he was very unpleased with them when he went before and he was really sick while going. Pt stated he would liek to discuss with Dr. Wynetta Emery if she will give him a call. I advised pt I understood his concerns however it is highly recommended he call nephrology or go to the ER.

## 2020-07-05 DIAGNOSIS — I129 Hypertensive chronic kidney disease with stage 1 through stage 4 chronic kidney disease, or unspecified chronic kidney disease: Secondary | ICD-10-CM | POA: Insufficient documentation

## 2020-07-05 NOTE — Assessment & Plan Note (Signed)
Checking labs today. Await results. Continue to follow with nephrology.

## 2020-07-05 NOTE — Assessment & Plan Note (Signed)
Rechecking labs today. Await results. Treat as needed.  °

## 2020-07-05 NOTE — Assessment & Plan Note (Signed)
Dose OK'd by psychiatry. Under good control on current regimen. Continue current regimen. Continue to monitor. Call with any concerns. Refills given.

## 2020-07-05 NOTE — Assessment & Plan Note (Signed)
Under good control on current regimen. Continue current regimen. Continue to monitor. Call with any concerns. Refills given. Labs drawn today.   

## 2020-07-07 ENCOUNTER — Other Ambulatory Visit: Payer: Self-pay | Admitting: Family Medicine

## 2020-07-07 NOTE — Telephone Encounter (Signed)
Requested Prescriptions  Pending Prescriptions Disp Refills  . traZODone (DESYREL) 100 MG tablet [Pharmacy Med Name: TRAZODONE 100 MG TABLET] 180 tablet 0    Sig: TAKE 1 TABLET BY MOUTH TWICE A DAY     Psychiatry: Antidepressants - Serotonin Modulator Passed - 07/07/2020  9:30 AM      Passed - Completed PHQ-2 or PHQ-9 in the last 360 days.      Passed - Valid encounter within last 6 months    Recent Outpatient Visits          6 days ago Depression, recurrent [facility])   [place], Megan P, DO   2 weeks ago Depression, recurrent ([place])   [place], Jessica A, NP   3 months ago Depression, recurrent [facility])   [place], Megan P, DO   6 months ago Routine general medical examination at a health care facility   [facility], [place] P, DO   1 year ago Depression, unspecified depression type   [facility] Jeananne Rama, Jeannette How, MD

## 2020-07-31 ENCOUNTER — Other Ambulatory Visit: Payer: Self-pay | Admitting: Family Medicine

## 2020-07-31 DIAGNOSIS — F339 Major depressive disorder, recurrent, unspecified: Secondary | ICD-10-CM

## 2020-07-31 NOTE — Telephone Encounter (Signed)
Requested  medications are  due for refill today yes  Requested medications are on the active medication list yes  Last refill 8/23  Last visit 8/18  Future visit scheduled no  Notes to clinic Not Delegated

## 2020-08-03 NOTE — Telephone Encounter (Signed)
Patient last seen 07/01/20.

## 2020-08-04 NOTE — Telephone Encounter (Signed)
Patient checking on the status of medication below, informed patient please allow 48 to 72 hour business turn around time, patient states he will run out tomorrow.

## 2020-08-04 NOTE — Telephone Encounter (Signed)
Has a Rx that will drop on 9/23. Not due until then

## 2020-08-05 ENCOUNTER — Other Ambulatory Visit: Payer: Self-pay | Admitting: Family Medicine

## 2020-08-05 DIAGNOSIS — F339 Major depressive disorder, recurrent, unspecified: Secondary | ICD-10-CM

## 2020-08-05 NOTE — Telephone Encounter (Signed)
Please refuse, RX is at the pharmacy.

## 2020-08-05 NOTE — Telephone Encounter (Signed)
Pt called stating that the pharmacy is stating they do not have a refill for him. Please advise.

## 2020-08-05 NOTE — Telephone Encounter (Signed)
Called and LVM letting patient know that he should already have a prescription at the pharmacy to fill tomorrow.

## 2020-08-05 NOTE — Telephone Encounter (Signed)
Requested medication (s) are due for refill today: yes  Requested medication (s) are on the active medication list: yes  Last refill:  07/06/20  Future visit scheduled: no  Notes to clinic:  not delegated    Requested Prescriptions  Pending Prescriptions Disp Refills   ALPRAZolam (XANAX) 1 MG tablet [Pharmacy Med Name: ALPRAZOLAM 1 MG TABLET] 75 tablet 0    Sig: TAKE 1 TABLET BY MOUTH IN THE MORNING, 1/2 TABLET AT 4PM AND 1 TABLET AT BEDTIME      Not Delegated - Psychiatry:  Anxiolytics/Hypnotics Failed - 08/05/2020 10:33 AM      Failed - This refill cannot be delegated      Failed - Urine Drug Screen completed in last 360 days.      Passed - Valid encounter within last 6 months    Recent Outpatient Visits           1 month ago Depression, recurrent ([place])   [place], Megan P, DO   1 month ago Depression, recurrent ([place])   [place] Eulogio Bear, NP   4 months ago Depression, recurrent [facility])   [place], Megan P, DO   7 months ago Routine general medical examination at a health care facility   [facility], [place] P, DO   1 year ago Depression, unspecified depression type   [facility]ama, Jeannette How, MD

## 2020-08-05 NOTE — Telephone Encounter (Signed)
[place] RX request already sent to clinical.

## 2020-08-05 NOTE — Telephone Encounter (Signed)
Called pharmacy. They did not have prescriptions on file for unknown reason. Called the prescriptions in verbally for start dates of 08/06/20 and 09/05/20, which is what Dr. Wynetta Emery had in his chart.   Called and LVM for patient letting him know that I have called these in.

## 2020-09-03 ENCOUNTER — Other Ambulatory Visit: Payer: Self-pay | Admitting: Family Medicine

## 2020-10-05 ENCOUNTER — Other Ambulatory Visit: Payer: Self-pay | Admitting: Family Medicine

## 2020-10-05 ENCOUNTER — Telehealth: Payer: Self-pay | Admitting: Family Medicine

## 2020-10-05 NOTE — Telephone Encounter (Signed)
Requested medication (s) are due for refill today - yes  Requested medication (s) are on the active medication list -yes  Future visit scheduled -no  Last refill: 09/05/20  Notes to clinic: Request non delegated Rx  Requested Prescriptions  Pending Prescriptions Disp Refills   ALPRAZolam (XANAX) 1 MG tablet [Pharmacy Med Name: ALPRAZOLAM 1 MG TABLET] 75 tablet 0    Sig: TAKE 1 TABLET BY MOUTH IN THE MORNING, 1/2 TABLET AT 4:00PM, AND 1 TABLET AT BEDTIME. START ON 10/23      Not Delegated - Psychiatry:  Anxiolytics/Hypnotics Failed - 10/05/2020 11:46 AM      Failed - This refill cannot be delegated      Failed - Urine Drug Screen completed in last 360 days      Passed - Valid encounter within last 6 months    Recent Outpatient Visits           3 months ago Depression, recurrent ([place])   [place], Megan P, DO   3 months ago Depression, recurrent ([place])   [place], Jessica A, NP   6 months ago Depression, recurrent [facility])   [place], Megan P, DO   9 months ago Routine general medical examination at a health care facility   [facility], [place], DO   1 year ago Depression, unspecified depression type   [facility] Guadalupe Maple, MD                  Requested Prescriptions  Pending Prescriptions Disp Refills   ALPRAZolam (XANAX) 1 MG tablet [Pharmacy Med Name: ALPRAZOLAM 1 MG TABLET] 75 tablet 0    Sig: TAKE 1 TABLET BY MOUTH IN THE MORNING, 1/2 TABLET AT 4:00PM, AND 1 TABLET AT BEDTIME. START ON 10/23      Not Delegated - Psychiatry:  Anxiolytics/Hypnotics Failed - 10/05/2020 11:46 AM      Failed - This refill cannot be delegated      Failed - Urine Drug Screen completed in last 360 days      Passed - Valid encounter within last 6 months    Recent Outpatient Visits           3 months ago Depression, recurrent ([place])   [place],  Megan P, DO   3 months ago Depression, recurrent ([place])   [place], Jessica A, NP   6 months ago Depression, recurrent [facility])   [place], Megan P, DO   9 months ago Routine general medical examination at a health care facility   [facility], [place] P, DO   1 year ago Depression, unspecified depression type   [facility] Jeananne Rama, Jeannette How, MD

## 2020-10-05 NOTE — Telephone Encounter (Signed)
LVM TO MAKE THIS APT  

## 2020-10-05 NOTE — Telephone Encounter (Signed)
appt

## 2020-10-06 MED ORDER — ALPRAZOLAM 1 MG PO TABS
ORAL_TABLET | ORAL | 0 refills | Status: DC
Start: 2020-10-06 — End: 2020-10-07

## 2020-10-06 NOTE — Telephone Encounter (Signed)
Pt has an appt with cheryl tomorrow 10-07-2020. Pt would like a refill on alprazolam

## 2020-10-06 NOTE — Telephone Encounter (Signed)
[facility] presented [facility] and [facility] is unable to get his medicine. [facility] [address]

## 2020-10-06 NOTE — Telephone Encounter (Signed)
I did not refuse medicine. I sent a message that he needs an appointment.  I will get him enough medicine to make it to his appointment once his appointment is made.

## 2020-10-06 NOTE — Telephone Encounter (Signed)
Pt is scheduled tomorrow to see Dylan Chandler

## 2020-10-06 NOTE — Telephone Encounter (Signed)
Called pt to advise rx has been sent to the pharmacy, pt verbalized understanding

## 2020-10-06 NOTE — Addendum Note (Signed)
Addended by: Valerie Roys on: 10/06/2020 02:50 PM   Modules accepted: Orders

## 2020-10-06 NOTE — Telephone Encounter (Signed)
Copied from [place] [phone]. Topic: Quick Communication - See Telephone Encounter >> Oct 05, 2020  5:02 PM [place] wrote: CRM for notification. See Telephone encounter for: 10/05/20.ALPRAZolam Duanne Moron) 1 MG tablet Medication Date: 07/01/2020 Department: [facility] Ordering/Authorizing: Valerie Roys, DO pt was denied med, had to make an appt, extremely upset, says this will be life-threatening to stop med so quickly, states he will be in ER, no appt available at office for tomorrow... he wants dr to call him as he states she will understand. Appt a couple days out he will be dead.

## 2020-10-07 ENCOUNTER — Other Ambulatory Visit: Payer: Self-pay

## 2020-10-07 ENCOUNTER — Telehealth: Payer: Self-pay | Admitting: Family Medicine

## 2020-10-07 ENCOUNTER — Other Ambulatory Visit: Payer: Self-pay | Admitting: Unknown Physician Specialty

## 2020-10-07 ENCOUNTER — Ambulatory Visit (INDEPENDENT_AMBULATORY_CARE_PROVIDER_SITE_OTHER): Payer: 59 | Admitting: Unknown Physician Specialty

## 2020-10-07 ENCOUNTER — Encounter: Payer: Self-pay | Admitting: Unknown Physician Specialty

## 2020-10-07 DIAGNOSIS — F339 Major depressive disorder, recurrent, unspecified: Secondary | ICD-10-CM | POA: Diagnosis not present

## 2020-10-07 DIAGNOSIS — F419 Anxiety disorder, unspecified: Secondary | ICD-10-CM | POA: Diagnosis not present

## 2020-10-07 DIAGNOSIS — R011 Cardiac murmur, unspecified: Secondary | ICD-10-CM | POA: Insufficient documentation

## 2020-10-07 MED ORDER — ALPRAZOLAM 1 MG PO TABS
ORAL_TABLET | ORAL | 0 refills | Status: DC
Start: 2020-10-07 — End: 2020-10-12

## 2020-10-07 NOTE — Progress Notes (Signed)
BP 107/68   Pulse (!) 101   Temp 97.9 F (36.6 C) (Oral)   Wt 222 lb 6.4 oz (100.9 kg)   SpO2 92%   BMI 33.51 kg/m    Subjective:    Patient ID: Dylan Chandler, male    DOB: 1966-08-20, 54 y.o.   MRN: [id number]  HPI: Dylan Chandler is a 54 y.o. male  Chief Complaint  Patient presents with  . Anxiety    requesting refill on Xanax  . Depression   Pt taking Zoloft 100mg  daily and Xanax 1 mg BID and1/2 mg once a day.  Review of chart shows this has been reviewed by psychiatry and on same dose for 10-15 years.  The dose and symptoms controlled.    Depression screen [facility] 2/9 10/07/2020 06/19/2020 03/17/2020 10/31/2018 04/23/2018  Decreased Interest 0 0 0 - 0  Down, Depressed, Hopeless 0 0 0 - 0  PHQ - 2 Score 0 0 0 - 0  Altered sleeping 0 0 0 2 -  Tired, decreased energy 0 1 1 2  -  Change in appetite 0 0 1 1 -  Feeling bad or failure about yourself  0 0 0 0 -  Trouble concentrating 0 0 0 0 -  Moving slowly or fidgety/restless 0 0 0 0 -  Suicidal thoughts 0 0 0 - -  PHQ-9 Score 0 1 2 - -  Difficult doing work/chores Not difficult at all - Not difficult at all - -   GAD 7 : Generalized Anxiety Score 10/07/2020 06/19/2020 03/17/2020  Nervous, Anxious, on Edge 0 1 0  Control/stop worrying 0 1 0  Worry too much - different things 0 1 0  Trouble relaxing 0 0 0  Restless 0 0 0  Easily annoyed or irritable 0 1 0  Afraid - awful might happen 0 1 0  Total GAD 7 Score 0 5 0  Anxiety Difficulty Not difficult at all Not difficult at all -     Relevant past medical, surgical, family and social history reviewed and updated as indicated. Interim medical history since our last visit reviewed. Allergies and medications reviewed and updated.  Review of Systems  Constitutional: Negative for appetite change.  Respiratory: Negative for cough, shortness of breath and wheezing.   Cardiovascular: Negative for chest pain, palpitations and leg swelling.    Per HPI unless specifically  indicated above     Objective:    BP 107/68   Pulse (!) 101   Temp 97.9 F (36.6 C) (Oral)   Wt 222 lb 6.4 oz (100.9 kg)   SpO2 92%   BMI 33.51 kg/m   Wt Readings from Last 3 Encounters:  10/07/20 222 lb 6.4 oz (100.9 kg)  07/01/20 218 lb 9.6 oz (99.2 kg)  06/19/20 217 lb (98.4 kg)    Physical Exam Constitutional:      General: He is not in acute distress. HENT:     Head: Normocephalic.  Cardiovascular:     Rate and Rhythm: Normal rate and regular rhythm.     Heart sounds: Murmur heard.   Pulmonary:     Effort: Pulmonary effort is normal.     Breath sounds: Normal breath sounds.  Musculoskeletal:        General: Normal range of motion.     Cervical back: Normal range of motion.  Neurological:     Mental Status: He is alert.     Comments: Generalized tremor worse with intention  Psychiatric:  Attention and Perception: Attention normal.        Speech: Speech normal.        Behavior: Behavior is cooperative.        Thought Content: Thought content normal.      Assessment & Plan:   Problem List Items Addressed This Visit      Unprioritized   Anxiety    This is stable on Xanax with dose stable for years.  Will refill today and can have 2 more monthly refills Recheck in 3 months      Relevant Medications   ALPRAZolam (XANAX) 1 MG tablet   Depression, recurrent ([place])    This is stable on present treatment. Continue current medication.        Relevant Medications   ALPRAZolam (XANAX) 1 MG tablet   Heart murmur    Not noted in the problem list previously.  Pt states this is typical for him.  No swelling or SOB.  States going to cardiology and due to be seen this winter          Follow up plan: Return in about 3 months (around 01/07/2021).

## 2020-10-07 NOTE — Assessment & Plan Note (Addendum)
Not noted in the problem list previously.  Pt states this is typical for him.  No swelling or SOB.  States going to cardiology and due to be seen this winter

## 2020-10-07 NOTE — Telephone Encounter (Signed)
Pt is calling again about his Rx/ pharmacy needs directions to fill for pt today before he leaves town/ Pt is waiting on office to call pharmacy with directions so he can leave town

## 2020-10-07 NOTE — Assessment & Plan Note (Signed)
This is stable on Xanax with dose stable for years.  Will refill today and can have 2 more monthly refills Recheck in 3 months

## 2020-10-07 NOTE — Assessment & Plan Note (Addendum)
This is stable on present treatment. Continue current medication.

## 2020-10-07 NOTE — Telephone Encounter (Signed)
Pt states Cheryl left the directions of of his Xanax and pharmacy ill ts dispense Pt states he takes 1 mg in the am, 1/2 mg at 4 pm and 1 mg at 8 pm .  Pt states he I supposed to go out of town now and he cannot go without his medication.  [id number] [place] [place] [facility] [address] Phone:  [phone]  Fax:  [phone]

## 2020-10-12 MED ORDER — ALPRAZOLAM 1 MG PO TABS
ORAL_TABLET | ORAL | 2 refills | Status: DC
Start: 2020-10-12 — End: 2020-12-15

## 2020-10-12 NOTE — Telephone Encounter (Signed)
Routing to provider  

## 2020-10-12 NOTE — Telephone Encounter (Signed)
Rx has been sent over. Only received this message 10 minutes ago.

## 2020-10-18 ENCOUNTER — Telehealth: Payer: Self-pay | Admitting: Family Medicine

## 2020-10-18 DIAGNOSIS — E230 Hypopituitarism: Secondary | ICD-10-CM

## 2020-10-18 NOTE — Telephone Encounter (Signed)
Requested medication (s) are due for refill today: yes  Requested medication (s) are on the active medication list: yes  Last refill:  04/16/20  Future visit scheduled: yes  Notes to clinic:  med not assigned to a protocol   Requested Prescriptions  Pending Prescriptions Disp Refills   testosterone cypionate (DEPOTESTOSTERONE CYPIONATE) 200 MG/ML injection [Pharmacy Med Name: TESTOSTERONE CYP 200 MG/ML] 2 mL     Sig: INJECT 0.5 MLS (100 MG TOTAL) INTO THE MUSCLE EVERY WEDNESDAY.      Off-Protocol Failed - 10/18/2020  3:36 PM      Failed - Medication not assigned to a protocol, review manually.      Passed - Valid encounter within last 12 months    Recent Outpatient Visits           1 week ago Depression, recurrent ([place])   [place], NP   3 months ago Depression, recurrent [facility])   [place], Megan P, DO   4 months ago Depression, recurrent ([place])   [place] Eulogio Bear, NP   7 months ago Depression, recurrent [facility])   [place], Megan P, DO   10 months ago Routine general medical examination at a health care facility   [place], Barb Merino, DO       Future Appointments             In 2 months Wynetta Emery, Barb Merino, DO [facility]

## 2020-10-19 NOTE — Telephone Encounter (Signed)
Routing to provider  

## 2020-10-22 NOTE — Telephone Encounter (Signed)
Refill request sent Monday.

## 2020-10-22 NOTE — Telephone Encounter (Signed)
Pt following up on this Rx testosterone cypionate (DEPOTESTOSTERONE CYPIONATE) 200 MG/ML injection  Pt states he sent request last week

## 2020-11-23 ENCOUNTER — Inpatient Hospital Stay: Payer: 59 | Admitting: Family Medicine

## 2020-12-08 ENCOUNTER — Telehealth: Payer: Self-pay

## 2020-12-08 ENCOUNTER — Telehealth: Payer: Self-pay | Admitting: *Deleted

## 2020-12-08 ENCOUNTER — Inpatient Hospital Stay: Payer: 59 | Admitting: Family Medicine

## 2020-12-08 NOTE — Telephone Encounter (Signed)
LVM TO MAKE APT if pt calls back please let front office know as we have spot on hold for him thank you!

## 2020-12-08 NOTE — Telephone Encounter (Signed)
Please advise 

## 2020-12-08 NOTE — Telephone Encounter (Signed)
1. Medication:  ALPRAZolam (XANAX) 1 MG tablet  Has the pt contacted their pharmacy? no [id number] [place]  Pt will be out of medication today.   2. Dr JWynetta Emery  Pt has dialysis from 11 am- 4 pm on Wed, so he cannot make 11 am appt on Wed..  Pt may benefit from a virtual appt at this time.  Pt is weak and says he is going through many changes.

## 2020-12-08 NOTE — Telephone Encounter (Signed)
Is there any other day Pt can be added on to see PCP?

## 2020-12-09 NOTE — Telephone Encounter (Signed)
Mail box full have called pt 2 times to get scheduled we do have an apt on hold for him on 12/15/20

## 2020-12-09 NOTE — Telephone Encounter (Signed)
Needs hospital follow up, was not able to keep his appointment yesterday. It's likely going to be next week if he does not want to see another provider

## 2020-12-10 ENCOUNTER — Telehealth: Payer: Self-pay | Admitting: Family Medicine

## 2020-12-10 NOTE — Telephone Encounter (Signed)
Lvm to make this apt. 

## 2020-12-15 ENCOUNTER — Other Ambulatory Visit: Payer: Self-pay

## 2020-12-15 ENCOUNTER — Encounter: Payer: Self-pay | Admitting: Family Medicine

## 2020-12-15 ENCOUNTER — Ambulatory Visit (INDEPENDENT_AMBULATORY_CARE_PROVIDER_SITE_OTHER): Payer: 59 | Admitting: Family Medicine

## 2020-12-15 VITALS — BP 114/77 | HR 106 | Temp 97.9°F | Wt 234.4 lb

## 2020-12-15 DIAGNOSIS — Z992 Dependence on renal dialysis: Secondary | ICD-10-CM | POA: Diagnosis not present

## 2020-12-15 DIAGNOSIS — E079 Disorder of thyroid, unspecified: Secondary | ICD-10-CM

## 2020-12-15 DIAGNOSIS — I120 Hypertensive chronic kidney disease with stage 5 chronic kidney disease or end stage renal disease: Secondary | ICD-10-CM

## 2020-12-15 DIAGNOSIS — F339 Major depressive disorder, recurrent, unspecified: Secondary | ICD-10-CM | POA: Diagnosis not present

## 2020-12-15 DIAGNOSIS — N185 Chronic kidney disease, stage 5: Secondary | ICD-10-CM

## 2020-12-15 MED ORDER — SERTRALINE HCL 100 MG PO TABS
200.0000 mg | ORAL_TABLET | Freq: Two times a day (BID) | ORAL | 1 refills | Status: AC
Start: 2020-12-15 — End: ?

## 2020-12-15 MED ORDER — TAMSULOSIN HCL 0.4 MG PO CAPS: 0.4000 mg | ORAL_CAPSULE | Freq: Every day | ORAL | 3 refills | Status: AC

## 2020-12-15 MED ORDER — TRAZODONE HCL 100 MG PO TABS: 100.0000 mg | ORAL_TABLET | Freq: Two times a day (BID) | ORAL | 1 refills | Status: AC

## 2020-12-15 MED ORDER — METOPROLOL SUCCINATE ER 25 MG PO TB24: 12.5000 mg | ORAL_TABLET | Freq: Every day | ORAL | 1 refills | Status: AC

## 2020-12-15 MED ORDER — LACTULOSE 10 GM/15ML PO SOLN: 10.0000 g | Freq: Every day | ORAL | 6 refills | Status: DC | PRN

## 2020-12-15 MED ORDER — LIOTHYRONINE SODIUM 25 MCG PO TABS
25.0000 ug | ORAL_TABLET | Freq: Every day | ORAL | 1 refills | Status: AC
Start: 2020-12-15 — End: ?

## 2020-12-15 MED ORDER — CLOPIDOGREL BISULFATE 75 MG PO TABS: 75.0000 mg | ORAL_TABLET | Freq: Every day | ORAL | 1 refills | Status: AC

## 2020-12-15 MED ORDER — FINASTERIDE 5 MG PO TABS
5.0000 mg | ORAL_TABLET | Freq: Every day | ORAL | 1 refills | Status: AC
Start: 2020-12-15 — End: ?

## 2020-12-15 MED ORDER — ALPRAZOLAM 1 MG PO TABS
ORAL_TABLET | ORAL | 2 refills | Status: AC
Start: 2020-12-15 — End: ?

## 2020-12-15 NOTE — Progress Notes (Signed)
BP 114/77   Pulse (!) 106   Temp 97.9 F (36.6 C) (Oral)   Wt 234 lb 6.4 oz (106.3 kg)   SpO2 90%   BMI 35.32 kg/m    Subjective:    Patient ID: Dylan Chandler, male    DOB: 1966-03-21, 55 y.o.   MRN: [id number]  HPI: Dylan Chandler is a 55 y.o. male  Chief Complaint  Patient presents with  . Hospitalization Follow-up   Transition of Care Hospital Follow up.   Hospital/Facility: [facility] D/C Physician: Dylan Ahr, MD D/C Date: 11/18/20  Records Requested: 12/15/20 Records Received: 12/15/20 Records Reviewed: 12/15/20  Diagnoses on Discharge: Volume overload Active Problems: CAD (coronary artery disease) End stage renal disease with hypertension (CMS-HCC) Chest pain, unspecified type VT (ventricular tachycardia) (CMS-HCC) Mitral regurgitation Transaminitis Chest pain, rule out acute myocardial infarction Primary Diagnosis: Admitted for volume overload from ESRD  Date of interactive Contact within 48 hours of discharge: 11/19/20 Contact was through: phone  Date of 7 day or 14 day face-to-face visit:   Not done in time period   Outpatient Encounter Medications as of 12/15/2020  Medication Sig Note  . aspirin EC 81 MG tablet Take 81 mg by mouth every evening.    . calcitRIOL (ROCALTROL) 0.25 MCG capsule Take 0.5 mcg by mouth every evening.   . calcium acetate (PHOSLO) 667 MG capsule Take 2,001 mg by mouth 3 (three) times daily with meals.   . calcium carbonate (TUMS EX) 750 MG chewable tablet Chew 2 tablets by mouth 3 (three) times daily.   Marland Kitchen ibuprofen (ADVIL) 200 MG tablet Take 800 mg by mouth 2 (two) times daily as needed for headache or moderate pain.   Marland Kitchen lactulose (CHRONULAC) 10 GM/15ML solution Take 15 mLs (10 g total) by mouth daily as needed.   Marland Kitchen LASIX 80 MG tablet Take 80 mg by mouth 2 (two) times daily.   . nitroGLYCERIN (NITROSTAT) 0.4 MG SL tablet PLACE ONE TABLET UNDER THE TONGUE AS NEEDED FOR CHEST PAIN 12/13/2019: Pt wants refill  . sildenafil  (VIAGRA) 100 MG tablet Take 20 mg by mouth 2 (two) times daily.    Marland Kitchen testosterone cypionate (DEPOTESTOSTERONE CYPIONATE) 200 MG/ML injection INJECT 0.5 MLS (100 MG TOTAL) INTO THE MUSCLE EVERY WEDNESDAY.   . [DISCONTINUED] ALPRAZolam (XANAX) 1 MG tablet 1 mg in the am, 1/2 mg at 4 pm and 1 mg at 8 pm   . [DISCONTINUED] clopidogrel (PLAVIX) 75 MG tablet Take 1 tablet (75 mg total) by mouth daily.   . [DISCONTINUED] finasteride (PROSCAR) 5 MG tablet Take 1 tablet (5 mg total) by mouth daily.   . [DISCONTINUED] lactulose (CHRONULAC) 10 GM/15ML solution Take 15 mLs by mouth daily as needed.   . [DISCONTINUED] liothyronine (CYTOMEL) 25 MCG tablet Take 2 tablets (50 mcg total) by mouth daily. (Patient taking differently: Take 25 mcg by mouth daily.)   . [DISCONTINUED] metoprolol succinate (TOPROL-XL) 25 MG 24 hr tablet Take 0.5 tablets by mouth daily.   . [DISCONTINUED] sertraline (ZOLOFT) 100 MG tablet Take 2 tablets (200 mg total) by mouth in the morning and at bedtime.   . [DISCONTINUED] tamsulosin (FLOMAX) 0.4 MG CAPS capsule Take 0.4 mg by mouth daily.   . [DISCONTINUED] traZODone (DESYREL) 100 MG tablet TAKE 1 TABLET BY MOUTH TWICE A DAY   . ALPRAZolam (XANAX) 1 MG tablet 1 mg in the am, 1/2 mg at 4 pm and 1 mg at 8 pm   . clopidogrel (PLAVIX) 75 MG  tablet Take 1 tablet (75 mg total) by mouth daily.   . finasteride (PROSCAR) 5 MG tablet Take 1 tablet (5 mg total) by mouth daily.   Marland Kitchen liothyronine (CYTOMEL) 25 MCG tablet Take 1 tablet (25 mcg total) by mouth daily.   . metoprolol succinate (TOPROL-XL) 25 MG 24 hr tablet Take 0.5 tablets (12.5 mg total) by mouth daily.   Marland Kitchen oxyCODONE (OXY IR/ROXICODONE) 5 MG immediate release tablet Take 5 mg by mouth 2 (two) times daily as needed. (Patient not taking: Reported on 12/15/2020)   . rosuvastatin (CRESTOR) 5 MG tablet Take 1 tablet (5 mg total) by mouth every other day. (Patient not taking: Reported on 12/15/2020)   . sertraline (ZOLOFT) 100 MG tablet Take 2  tablets (200 mg total) by mouth in the morning and at bedtime.   . sodium bicarbonate 650 MG tablet Take 1,300 mg by mouth 3 (three) times daily. (Patient not taking: Reported on 12/15/2020)   . tamsulosin (FLOMAX) 0.4 MG CAPS capsule Take 1 capsule (0.4 mg total) by mouth daily.   . traZODone (DESYREL) 100 MG tablet Take 1 tablet (100 mg total) by mouth 2 (two) times daily.   . [DISCONTINUED] metoprolol succinate (TOPROL-XL) 50 MG 24 hr tablet Take 1 tablet (50 mg total) by mouth 2 (two) times daily. (Patient not taking: Reported on 10/07/2020)   . [DISCONTINUED] ramipril (ALTACE) 10 MG capsule Take 1 capsule (10 mg total) by mouth every evening.   . [DISCONTINUED] torsemide (DEMADEX) 20 MG tablet Take 20 mg by mouth 2 (two) times daily.    No facility-administered encounter medications on file as of 12/15/2020.  Per Hospitalist: "Dylan Chandler. is a 55 y.o. 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 Patient first was placed on HD in June 2020 but then stopped after several months as he felt HD made him feel sick/unwell. Fortunately his kidney function improved/was stable enough off HD for more than a year until last month when he developed UTIs followed by increased LE edema and shortness of breath and was found to have hyperkalemia, acidemia, and symptomatic uremia so resumed on HD. Then admitted again at [facility] from 12/23-12/28 with AHRF and hyperkalemia in setting of missing ONE HD session. Weight at discharge was 108kg (he reports dry weight at 100kg). Went to HD yesterday as scheduled and 4L were removed. Despite this, started feeling short of breath with increased leg swelling and abdominal swelling last night, worsening overnight and so prompted to come to ER this morning. Feels so  short of breath can only walk a few feet. Has only been drinking 1300cc of fluid a day and avoiding salt. He also reports an episode of chest pain that started some time this morning, along mid sternum, radiating to left arm, similar in nature to his previous heart attack. It lasted several hours and resolved on its own. Was sitting in a chair when it started, did not try to take any nitroglycerin. He is making very little urine, has seen no improvement with Torsemide, even tried to take '80mg'$  this morning with no increased urine output. Mr Eckenroth is concerned that his heart valve issues (severe MR) are part of the problem with his volume status. He tried to message Dr Lincoln Brigham last month but did  not hear back. When asked why he did not want an ICD placed, he stated that he could not feel the abnormal rhythm so did not think he needed one. Says EP did some mapping in the past and could only provoke VT 1% of the time. Denies any nausea, fever, chills, diarrhea, new rashes or lesions. No other urinary symptoms.  In ED, given ASA '324mg'$ . Nephrology consulted, had planned to do HD session tonight but now moved to first shift tomorrow AM. _____________________  Hospital Course by Problem:  47M w/ ESRD on MWF HD, CAD s/p PCI x3, ICM (EF 40% 09/2020), severe MR, recurrent VT - admitted w/ volume overload.  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 Pt was on HD, then stopped for 9 months and restarted 1 mo prior to admission for anasarca. Has been hospitalized repeatedly for volume overload. Weight on presentation was 106kg, improved to 98.4 kg on day of discharge.  --Nephrology recommending patient receive HD 4x/week as outpatient for at least 2-3 weeks  --cont Phoslo '2100mg'$  TID with meals --cont Calcitriol 0.'25mg'$  daily --cont Tums TID  ICM with severe MR: Has not tolerated GDMT well, with lethargy on metoprolol. Had bradycardia last admission but this was  believed 2/2 hyperkalemia.  --cardiology consult: started on metoprolol 12.'5mg'$  qd --outpatient referral to cardiology placed  CAD s/p PCI x3: most recently 12/2019 --Continue DAPT x1y --restart PTA rosuvastatin on discharge now that LFTs near-normal   h/o VT: seen on EP study in 12/2019. Previously declined EP intervention or life vest. Tele without events.  --Continue metoprolol 12.'5mg'$  qd  Anxiety, depression:  --home regimen: Xanax '1mg'$  qAM, 0.'5mg'$  '@1600'$ , '1mg'$  qPM, Sertraline '200mg'$  BID, trazodone '100mg'$  BID  Hypothyroidism: TSH 1.21 on 12/4. --cont Cytomel 4mg qd.   BPH: --cont Tamsulosin, Finasteride  Surgeries and Procedures Performed:  Hemodialysis"   Diagnostic Tests Reviewed  Disposition: Home  Consults: Cardiology and nephrology  Discharge Instructions: Follow up here, with cardiology and nephrology  Disease/illness Education: Discussed today  Home Health/Community Services Discussions/Referrals: N/A  Establishment or re-establishment of referral orders for community resources: N/A  Discussion with other health care providers: N/A  Assessment and Support of treatment regimen adherence: F[place]aci[place]: Patient  Education for self-management, independent living, and ADLs: Discussed today  Since getting out of the hospital, LTruman Haywardhas been feeling not well. He notes that he has not been able to urinate which has been effecting his ability for them to get to the dry weight with his dialysis. He notes that he has not been feeling well. He notes that he has been having a lot of swelling- dialysis has been helping with that. He has been going to dialysis with [facility]. He is concerned about his fatigue and his swelling.   ANXIETY/STRESS- he notes that his mood has not been doing well with his mood. He has been very anxious   Duration: chronic Status:exacerbated Anxious mood: yes  Excessive worrying: yes Irritability: yes  Sweating: no Nausea:  no Palpitations:no Hyperventilation: no Panic attacks: no Agoraphobia: no  Obscessions/compulsions: no Depressed mood: yes [facility] 10/07/2020 06/19/2020 03/17/2020 10/31/2018 04/23/2018  Decreased Interest 0 0 0 - 0  Down, Depressed, Hopeless 0 0 0 - 0  PHQ - 2 Score 0 0 0 - 0  Altered sleeping 0 0 0 2 -  Tired, decreased energy 0 '1 1 2 '$ -  Change in appetite 0 0 1 1 -  Feeling bad or  failure about yourself  0 0 0 0 -  Trouble concentrating 0 0 0 0 -  Moving slowly or fidgety/restless 0 0 0 0 -  Suicidal thoughts 0 0 0 - -  PHQ-9 Score 0 1 2 - -  Difficult doing work/chores Not difficult at all - Not difficult at all - -   Anhedonia: no Weight changes: yes Insomnia: no   Hypersomnia: no Fatigue/loss of energy: yes Feelings of worthlessness: yes Feelings of guilt: yes Impaired concentration/indecisiveness: no Suicidal ideations: no  Crying spells: no Recent Stressors/Life Changes: yes   Relationship problems: no   Family stress: no     Financial stress: no    Job stress: no    Recent death/loss: no  Relevant past medical, surgical, family and social history reviewed and updated as indicated. Interim medical history since our last visit reviewed. Allergies and medications reviewed and updated.  Review of Systems  Constitutional: Positive for fatigue. Negative for activity change, appetite change, chills, diaphoresis, fever and unexpected weight change.  HENT: Negative.   Respiratory: Positive for shortness of breath. Negative for apnea, cough, choking, chest tightness, wheezing and stridor.   Cardiovascular: Positive for leg swelling. Negative for chest pain and palpitations.  Gastrointestinal: Negative.   Musculoskeletal: Negative.   Skin: Negative.   Psychiatric/Behavioral: Positive for dysphoric mood. Negative for agitation, behavioral problems, confusion, decreased concentration, hallucinations, self-injury, sleep disturbance and suicidal ideas. The  patient is nervous/anxious. The patient is not hyperactive.     Per HPI unless specifically indicated above     Objective:    BP 114/77   Pulse (!) 106   Temp 97.9 F (36.6 C) (Oral)   Wt 234 lb 6.4 oz (106.3 kg)   SpO2 90%   BMI 35.32 kg/m   Wt Readings from Last 3 Encounters:  12/15/20 234 lb 6.4 oz (106.3 kg)  10/07/20 222 lb 6.4 oz (100.9 kg)  07/01/20 218 lb 9.6 oz (99.2 kg)    Physical Exam Vitals and nursing note reviewed.  Constitutional:      General: He is not in acute distress.    Appearance: Normal appearance. He is not ill-appearing, toxic-appearing or diaphoretic.  HENT:     Head: Normocephalic and atraumatic.     Right Ear: External ear normal.     Left Ear: External ear normal.     Nose: Nose normal.     Mouth/Throat:     Mouth: Mucous membranes are moist.     Pharynx: Oropharynx is clear.  Eyes:     General: No scleral icterus.       Right eye: No discharge.        Left eye: No discharge.     Extraocular Movements: Extraocular movements intact.     Conjunctiva/sclera: Conjunctivae normal.     Pupils: Pupils are equal, round, and reactive to light.  Cardiovascular:     Rate and Rhythm: Normal rate and regular rhythm.     Pulses: Normal pulses.     Heart sounds: Normal heart sounds. No murmur heard. No friction rub. No gallop.   Pulmonary:     Effort: Pulmonary effort is normal. No respiratory distress.     Breath sounds: Normal breath sounds. No stridor. No wheezing, rhonchi or rales.  Chest:     Chest wall: No tenderness.  Musculoskeletal:        General: Normal range of motion.     Cervical back: Normal range of motion and neck supple.  Right lower leg: Edema (massive) present.     Left lower leg: Edema (massive) present.     Comments: Swelling up to midabdomen  Skin:    General: Skin is warm and dry.     Capillary Refill: Capillary refill takes less than 2 seconds.     Coloration: Skin is not jaundiced or pale.     Findings: No  bruising, erythema, lesion or rash.  Neurological:     General: No focal deficit present.     Mental Status: He is alert and oriented to person, place, and time. Mental status is at baseline.  Psychiatric:        Mood and Affect: Mood normal.        Behavior: Behavior normal.        Thought Content: Thought content normal.        Judgment: Judgment normal.     Results for orders placed or performed in visit on 07/01/20  Microscopic Examination   Urine  Result Value Ref Range   WBC, UA None seen 0 - 5 /hpf   RBC 0-2 0 - 2 /hpf   Epithelial Cells (non renal) 0-10 0 - 10 /hpf   Bacteria, UA None seen None seen/Few  Hepatitis C Antibody  Result Value Ref Range   Hep C Virus Ab 0.2 0.0 - 0.9 s/co ratio  CBC with Differential/Platelet  Result Value Ref Range   WBC 8.0 3.4 - 10.8 x10E3/uL   RBC 3.85 (L) 4.14 - 5.80 x10E6/uL   Hemoglobin 11.4 (L) 13.0 - 17.7 g/dL   Hematocrit 35.0 (L) 37.5 - 51.0 %   MCV 91 79 - 97 fL   MCH 29.6 26.6 - 33.0 pg   MCHC 32.6 31.5 - 35.7 g/dL   RDW 15.5 (H) 11.6 - 15.4 %   Platelets 167 150 - 450 x10E3/uL   Neutrophils 73 Not Estab. %   Lymphs 9 Not Estab. %   Monocytes 13 Not Estab. %   Eos 4 Not Estab. %   Basos 0 Not Estab. %   Neutrophils Absolute 5.8 1.4 - 7.0 x10E3/uL   Lymphocytes Absolute 0.7 0.7 - 3.1 x10E3/uL   Monocytes Absolute 1.1 (H) 0.1 - 0.9 x10E3/uL   EOS (ABSOLUTE) 0.3 0.0 - 0.4 x10E3/uL   Basophils Absolute 0.0 0.0 - 0.2 x10E3/uL   Immature Granulocytes 1 Not Estab. %   Immature Grans (Abs) 0.1 0.0 - 0.1 x10E3/uL  Comprehensive metabolic panel  Result Value Ref Range   Glucose 74 65 - 99 mg/dL   BUN 122 (HH) 6 - 24 mg/dL   Creatinine, Ser 9.03 (H) 0.76 - 1.27 mg/dL   GFR calc non Af Amer 6 (L) >59 mL/min/1.73   GFR calc Af Amer 7 (L) >59 mL/min/1.73   BUN/Creatinine Ratio 14 9 - 20   Sodium 138 134 - 144 mmol/L   Potassium 6.8 (HH) 3.5 - 5.2 mmol/L   Chloride 106 96 - 106 mmol/L   CO2 14 (L) 20 - 29 mmol/L   Calcium  8.1 (L) 8.7 - 10.2 mg/dL   Total Protein 6.1 6.0 - 8.5 g/dL   Albumin 3.6 (L) 3.8 - 4.9 g/dL   Globulin, Total 2.5 1.5 - 4.5 g/dL   Albumin/Globulin Ratio 1.4 1.2 - 2.2   Bilirubin Total 0.3 0.0 - 1.2 mg/dL   Alkaline Phosphatase 95 48 - 121 IU/L   AST 26 0 - 40 IU/L   ALT 25 0 - 44 IU/L  Lipid Panel w/o Chol/HDL  Ratio  Result Value Ref Range   Cholesterol, Total 104 100 - 199 mg/dL   Triglycerides 62 0 - 149 mg/dL   HDL 34 (L) >39 mg/dL   VLDL Cholesterol Cal 14 5 - 40 mg/dL   LDL Chol Calc (NIH) 56 0 - 99 mg/dL  TSH  Result Value Ref Range   TSH 1.290 0.450 - 4.500 uIU/mL  Urinalysis, Routine w reflex microscopic  Result Value Ref Range   Specific Gravity, UA 1.010 1.005 - 1.030   pH, UA 5.0 5.0 - 7.5   Color, UA Yellow Yellow   Appearance Ur Clear Clear   Leukocytes,UA Negative Negative   Protein,UA 2+ (A) Negative/Trace   Glucose, UA Negative Negative   Ketones, UA Negative Negative   RBC, UA 1+ (A) Negative   Bilirubin, UA Negative Negative   Urobilinogen, Ur 0.2 0.2 - 1.0 mg/dL   Nitrite, UA Negative Negative   Microscopic Examination See below:   PSA  Result Value Ref Range   Prostate Specific Ag, Serum 0.5 0.0 - 4.0 ng/mL      Assessment & Plan:   Problem List Items Addressed This Visit      Endocrine   Thyroid disease    Under good control on current regimen. Continue current regimen. Continue to monitor. Call with any concerns. Refills given.        Relevant Medications   liothyronine (CYTOMEL) 25 MCG tablet   metoprolol succinate (TOPROL-XL) 25 MG 24 hr tablet     Genitourinary   CKD stage 5 secondary to hypertension ([place])    Follows with nephrology. Not feeling well- will work with nephrology on finding a balance and getting him feeling better. Call with any concerns.         Other   Depression, recurrent ([place])    Not doing great, seems to be due to his physical illness. Will continue current regimen for now. Refills given. Call with any  concerns.       Relevant Medications   sertraline (ZOLOFT) 100 MG tablet   traZODone (DESYREL) 100 MG tablet   ALPRAZolam (XANAX) 1 MG tablet   Dialysis patient ([place]) - Primary    Follows with nephrology. Not feeling well- will work with nephrology on finding a balance and getting him feeling better. Call with any concerns.           Follow up plan: Return in about 3 months (around 03/14/2021).   >25 minutes spent with patient today

## 2020-12-24 ENCOUNTER — Telehealth: Payer: Self-pay | Admitting: Surgery

## 2020-12-24 NOTE — Telephone Encounter (Signed)
Outgoing call is made.  Patient being referred from [facility] for [facility] evaluation to see if candidate for PD placement.  Left message for patient to call so we can get him scheduled.

## 2020-12-27 ENCOUNTER — Encounter: Payer: Self-pay | Admitting: Family Medicine

## 2020-12-27 NOTE — Assessment & Plan Note (Signed)
Not doing great, seems to be due to his physical illness. Will continue current regimen for now. Refills given. Call with any concerns.

## 2020-12-27 NOTE — Assessment & Plan Note (Signed)
Follows with nephrology. Not feeling well- will work with nephrology on finding a balance and getting him feeling better. Call with any concerns.

## 2020-12-27 NOTE — Assessment & Plan Note (Signed)
Under good control on current regimen. Continue current regimen. Continue to monitor. Call with any concerns. Refills given.   

## 2020-12-28 ENCOUNTER — Telehealth: Payer: Self-pay | Admitting: Surgery

## 2020-12-28 NOTE — Telephone Encounter (Signed)
To date multiple messages have been left for the patient to call so that we can schedule appointment for PD catheter placement evaluation.  No return call.

## 2020-12-30 ENCOUNTER — Other Ambulatory Visit: Payer: Self-pay

## 2020-12-30 ENCOUNTER — Emergency Department (HOSPITAL_COMMUNITY): Payer: 59

## 2020-12-30 ENCOUNTER — Encounter (HOSPITAL_COMMUNITY): Payer: Self-pay

## 2020-12-30 ENCOUNTER — Emergency Department (HOSPITAL_COMMUNITY)
Admission: EM | Admit: 2020-12-30 | Discharge: 2020-12-31 | Disposition: A | Discharge status: Home / Self Care | Payer: 59 | Attending: Emergency Medicine | Admitting: Emergency Medicine

## 2020-12-30 DIAGNOSIS — R2241 Localized swelling, mass and lump, right lower limb: Secondary | ICD-10-CM | POA: Diagnosis not present

## 2020-12-30 DIAGNOSIS — M25561 Pain in right knee: Secondary | ICD-10-CM | POA: Diagnosis not present

## 2020-12-30 DIAGNOSIS — E8770 Fluid overload, unspecified: Secondary | ICD-10-CM | POA: Insufficient documentation

## 2020-12-30 DIAGNOSIS — Z5321 Procedure and treatment not carried out due to patient leaving prior to being seen by health care provider: Secondary | ICD-10-CM | POA: Diagnosis not present

## 2020-12-30 DIAGNOSIS — M79661 Pain in right lower leg: Secondary | ICD-10-CM | POA: Diagnosis not present

## 2020-12-30 LAB — CBC
HCT: 36.4 % — ABNORMAL LOW (ref 39.0–52.0)
Hemoglobin: 10.8 g/dL — ABNORMAL LOW (ref 13.0–17.0)
MCH: 27.4 pg (ref 26.0–34.0)
MCHC: 29.7 g/dL — ABNORMAL LOW (ref 30.0–36.0)
MCV: 92.4 fL (ref 80.0–100.0)
Platelets: 172 10*3/uL (ref 150–400)
RBC: 3.94 MIL/uL — ABNORMAL LOW (ref 4.22–5.81)
RDW: 18.9 % — ABNORMAL HIGH (ref 11.5–15.5)
WBC: 12.2 10*3/uL — ABNORMAL HIGH (ref 4.0–10.5)
nRBC: 0.2 % (ref 0.0–0.2)

## 2020-12-30 LAB — BASIC METABOLIC PANEL
Anion gap: 14 (ref 5–15)
BUN: 51 mg/dL — ABNORMAL HIGH (ref 6–20)
CO2: 20 mmol/L — ABNORMAL LOW (ref 22–32)
Calcium: 8.7 mg/dL — ABNORMAL LOW (ref 8.9–10.3)
Chloride: 102 mmol/L (ref 98–111)
Creatinine, Ser: 5.03 mg/dL — ABNORMAL HIGH (ref 0.61–1.24)
GFR, Estimated: 13 mL/min — ABNORMAL LOW (ref >60)
Glucose, Bld: 150 mg/dL — ABNORMAL HIGH (ref 70–99)
Potassium: 3.2 mmol/L — ABNORMAL LOW (ref 3.5–5.1)
Sodium: 136 mmol/L (ref 135–145)

## 2020-12-30 NOTE — ED Triage Notes (Signed)
Patient coming from home, HD patient [facility], went to dialysis today and had 4.1L taken off today, reports he feels fluid overloaded, reports feeling swollen all over but especially legs, reports R knee/shin pain.

## 2020-12-31 NOTE — ED Notes (Signed)
Pt did not answer when called for room 

## 2021-01-05 ENCOUNTER — Telehealth: Payer: Self-pay | Admitting: Surgery

## 2021-01-05 ENCOUNTER — Ambulatory Visit: Payer: Self-pay | Admitting: Surgery

## 2021-01-05 NOTE — Telephone Encounter (Signed)
Patient was a no show for his appointment for evaluation with Dr. Christian Mate today to discuss possible PD catheter placement.  Patient has been called several times and even reminded the day before his appointment via voice mail.  Patient has yet to return any calls.  Dylan Chandler. With [facility] is informed as well.

## 2021-01-08 ENCOUNTER — Ambulatory Visit: Payer: 59 | Admitting: Family Medicine

## 2021-01-21 ENCOUNTER — Other Ambulatory Visit: Payer: Self-pay | Admitting: Family Medicine

## 2021-01-21 DIAGNOSIS — E230 Hypopituitarism: Secondary | ICD-10-CM

## 2021-01-21 NOTE — Telephone Encounter (Signed)
Mailbox full unable to lvm to make this apt.

## 2021-01-21 NOTE — Telephone Encounter (Signed)
Requested medications are due for refill today.  yes  Requested medications are on the active medications list.  yes  Last refill. 10/28/2020  Future visit scheduled.   yes  Notes to clinic.  No protocol for this medication. Please advise.

## 2021-01-21 NOTE — Telephone Encounter (Signed)
Has not had blood work for testosterone since 2020. Will need appt for refills.

## 2021-01-22 NOTE — Telephone Encounter (Signed)
Mailbox full unable to lvm to make this apt.

## 2021-01-25 ENCOUNTER — Encounter: Payer: Self-pay | Admitting: Family Medicine

## 2021-01-25 NOTE — Telephone Encounter (Signed)
Called to schedule no answer vm full  °

## 2021-01-25 NOTE — Telephone Encounter (Signed)
3 attempts made to contact patient sent letter.

## 2021-01-26 ENCOUNTER — Telehealth: Payer: Self-pay

## 2021-01-26 ENCOUNTER — Other Ambulatory Visit: Payer: Self-pay | Admitting: Family Medicine

## 2021-01-26 DIAGNOSIS — I129 Hypertensive chronic kidney disease with stage 1 through stage 4 chronic kidney disease, or unspecified chronic kidney disease: Secondary | ICD-10-CM

## 2021-01-26 DIAGNOSIS — E230 Hypopituitarism: Secondary | ICD-10-CM

## 2021-01-26 NOTE — Telephone Encounter (Signed)
Copied from [place] [id number]. Topic: General - Inquiry >> Jan 26, 2021  4:39 PM Dylan Chandler wrote: Reason for CRM: pt called saying he is at [facility] for renal related issues. It will be 2 weeks Friday.  He is asking for a refill on his Depotestosterone injection.  He will need this Saturday.  He uses [facility] [place]  CB#  [phone]

## 2021-01-27 MED ORDER — TESTOSTERONE CYPIONATE 200 MG/ML IM SOLN: 100.0000 mg | INTRAMUSCULAR | 0 refills | Status: AC

## 2021-01-27 NOTE — Telephone Encounter (Signed)
Tried calling patient, no answer and VM full. Will try to call again.

## 2021-01-27 NOTE — Telephone Encounter (Signed)
Medication sent to the pharmacy.  PMP checked.

## 2021-01-28 NOTE — Telephone Encounter (Signed)
Tried calling patient, no answer and no VM. Will try to call again later.

## 2021-03-08 ENCOUNTER — Other Ambulatory Visit: Payer: Self-pay | Admitting: Family Medicine

## 2021-03-09 ENCOUNTER — Telehealth: Payer: Self-pay

## 2021-03-09 ENCOUNTER — Encounter: Payer: Self-pay | Admitting: Family Medicine

## 2021-03-09 NOTE — Telephone Encounter (Signed)
Called pt to r/s 5/3 appt no answer vm full

## 2021-03-16 ENCOUNTER — Ambulatory Visit: Payer: 59 | Admitting: Family Medicine

## 2021-04-14 DEATH — deceased

## 2021-08-12 IMAGING — DX DG CHEST 2V
2 series · 2 of 2 positions shown · non-contrast
Comparison: 06/12/2019

CLINICAL DATA: Short of breath, right knee pain

EXAM:
CHEST - 2 VIEW

[chest lat]
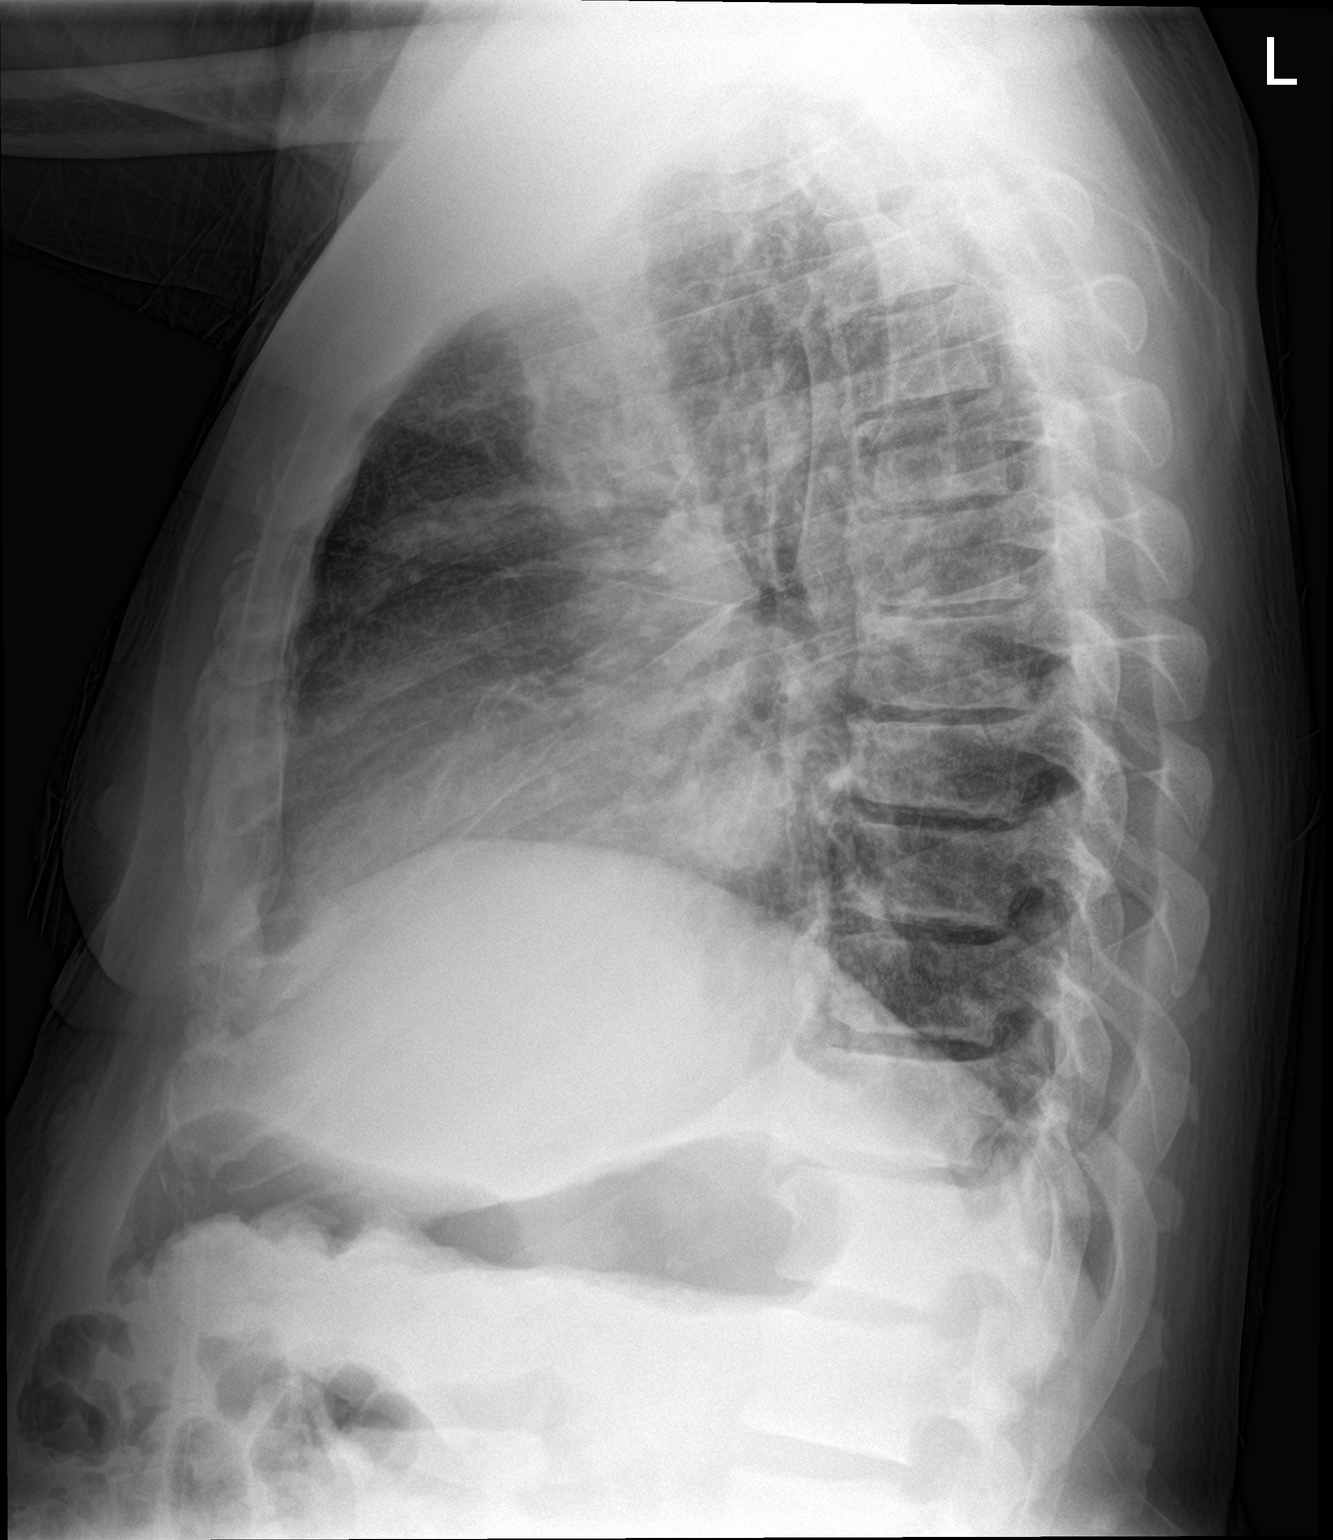

[chest ap]
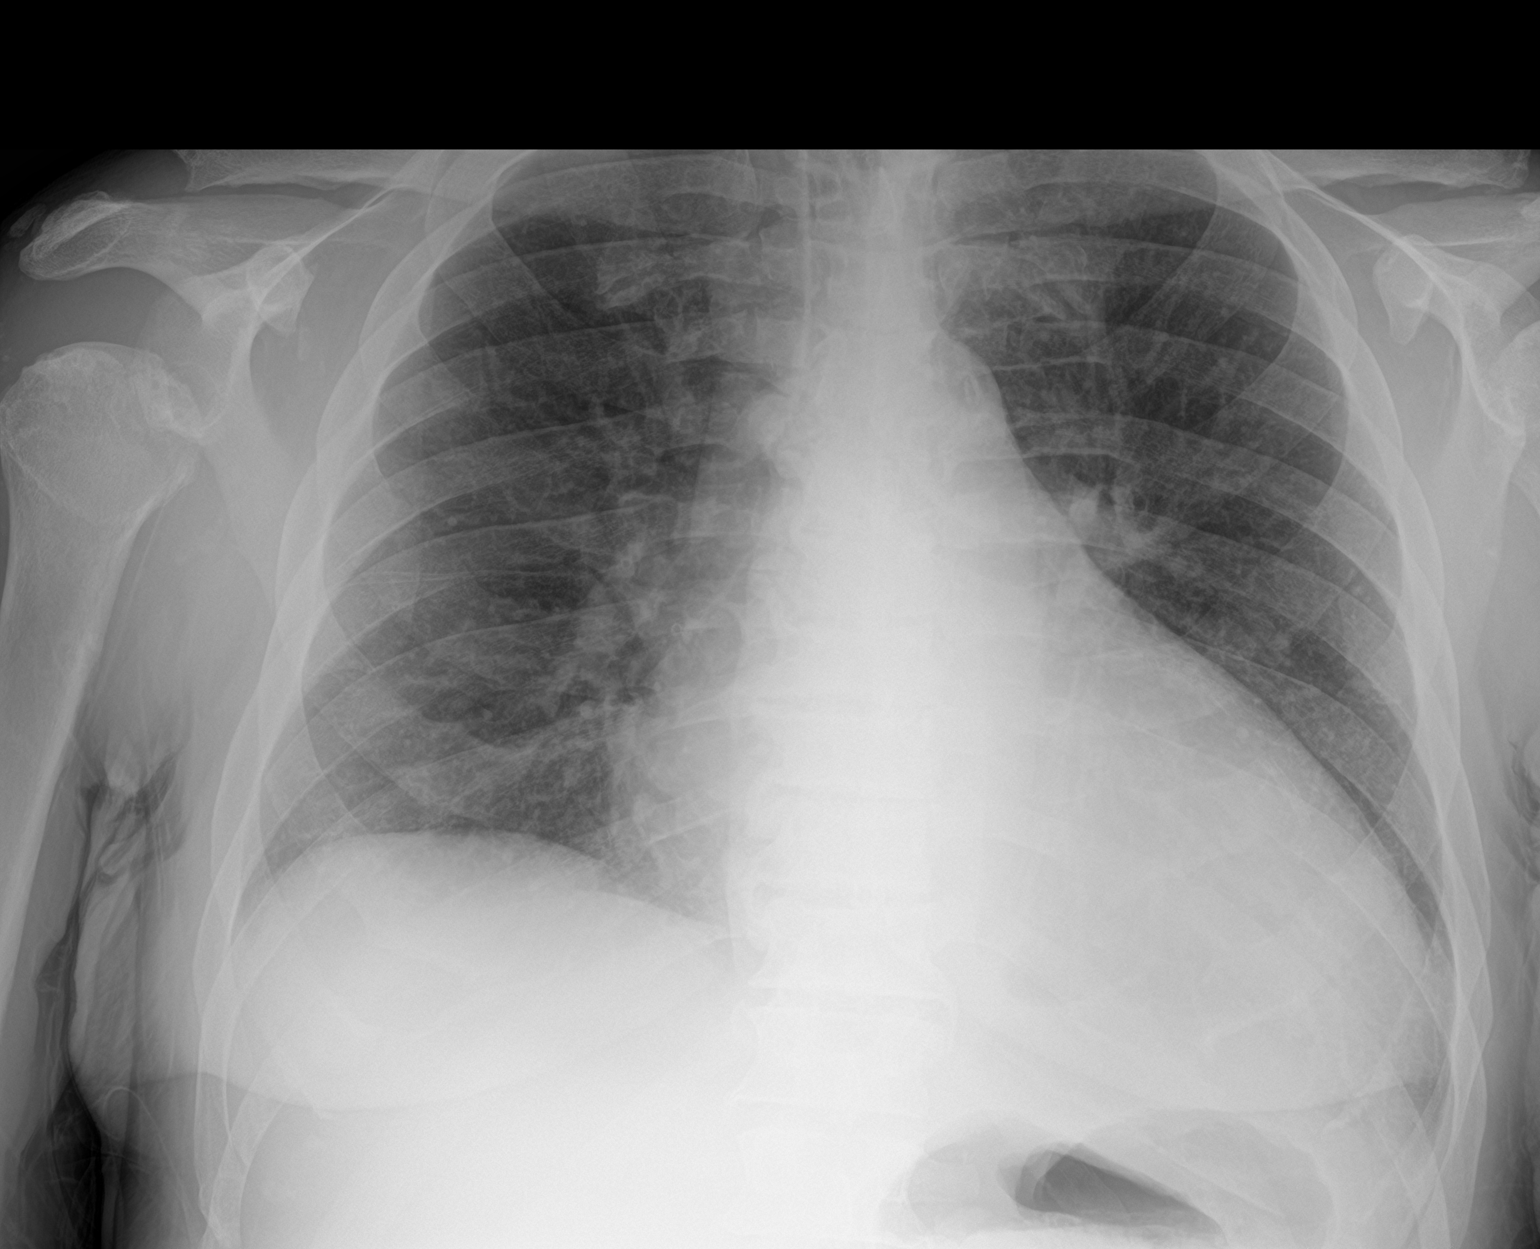

[2 of 2 positions shown; findings below may reference images not displayed]

FINDINGS: Frontal and lateral views of the chest demonstrate an enlarged
cardiac silhouette. Chronic central vascular congestion without
acute airspace disease, effusion, or pneumothorax. Probable
avascular necrosis of the right humeral head.
IMPRESSION: 1. Interval enlargement the cardiac silhouette, pericardial effusion
not excluded.
2. Central vascular congestion without acute airspace disease.
